# Patient Record
Sex: Female | Born: 1950 | Race: White | Hispanic: No | Marital: Married | Smoking: Current every day smoker
Health system: Southern US, Community
[De-identification: ages and names within clinical notes are randomized; demographics above are authoritative.]

## PROBLEM LIST (undated history)

## (undated) DIAGNOSIS — I10 Essential (primary) hypertension: Secondary | ICD-10-CM

## (undated) DIAGNOSIS — IMO0002 Reserved for concepts with insufficient information to code with codable children: Secondary | ICD-10-CM

## (undated) DIAGNOSIS — M8580 Other specified disorders of bone density and structure, unspecified site: Secondary | ICD-10-CM

## (undated) DIAGNOSIS — M1990 Unspecified osteoarthritis, unspecified site: Secondary | ICD-10-CM

## (undated) DIAGNOSIS — Z87440 Personal history of urinary (tract) infections: Secondary | ICD-10-CM

## (undated) DIAGNOSIS — R200 Anesthesia of skin: Secondary | ICD-10-CM

## (undated) DIAGNOSIS — E039 Hypothyroidism, unspecified: Secondary | ICD-10-CM

## (undated) DIAGNOSIS — K635 Polyp of colon: Secondary | ICD-10-CM

## (undated) DIAGNOSIS — E1165 Type 2 diabetes mellitus with hyperglycemia: Secondary | ICD-10-CM

## (undated) DIAGNOSIS — D649 Anemia, unspecified: Secondary | ICD-10-CM

## (undated) DIAGNOSIS — M5416 Radiculopathy, lumbar region: Secondary | ICD-10-CM

## (undated) HISTORY — PX: ESOPHAGOGASTRODUODENOSCOPY ENDOSCOPY: SHX5814

## (undated) HISTORY — DX: Anemia, unspecified: D64.9

## (undated) HISTORY — DX: Type 2 diabetes mellitus with hyperglycemia: E11.65

## (undated) HISTORY — DX: Radiculopathy, lumbar region: M54.16

## (undated) HISTORY — PX: COLONOSCOPY: SHX174

## (undated) HISTORY — DX: Hypothyroidism, unspecified: E03.9

## (undated) HISTORY — PX: THYROIDECTOMY: SHX17

## (undated) HISTORY — DX: Reserved for concepts with insufficient information to code with codable children: IMO0002

## (undated) HISTORY — DX: Essential (primary) hypertension: I10

## (undated) HISTORY — DX: Polyp of colon: K63.5

## (undated) HISTORY — DX: Other specified disorders of bone density and structure, unspecified site: M85.80

## (undated) HISTORY — PX: ABDOMINAL HYSTERECTOMY: SHX81

---

## 1999-12-14 HISTORY — PX: FOOT AMPUTATION: SHX951

## 2012-04-29 ENCOUNTER — Emergency Department: Payer: Self-pay | Admitting: *Deleted

## 2016-01-13 DIAGNOSIS — I152 Hypertension secondary to endocrine disorders: Secondary | ICD-10-CM | POA: Insufficient documentation

## 2016-01-13 DIAGNOSIS — D492 Neoplasm of unspecified behavior of bone, soft tissue, and skin: Secondary | ICD-10-CM | POA: Insufficient documentation

## 2016-01-13 DIAGNOSIS — E039 Hypothyroidism, unspecified: Secondary | ICD-10-CM | POA: Insufficient documentation

## 2016-01-13 DIAGNOSIS — S98011A Complete traumatic amputation of right foot at ankle level, initial encounter: Secondary | ICD-10-CM | POA: Insufficient documentation

## 2016-01-13 DIAGNOSIS — E119 Type 2 diabetes mellitus without complications: Secondary | ICD-10-CM | POA: Insufficient documentation

## 2016-01-13 DIAGNOSIS — L659 Nonscarring hair loss, unspecified: Secondary | ICD-10-CM | POA: Insufficient documentation

## 2016-01-13 DIAGNOSIS — F5101 Primary insomnia: Secondary | ICD-10-CM | POA: Insufficient documentation

## 2016-01-13 DIAGNOSIS — E1159 Type 2 diabetes mellitus with other circulatory complications: Secondary | ICD-10-CM | POA: Insufficient documentation

## 2016-01-13 DIAGNOSIS — I10 Essential (primary) hypertension: Secondary | ICD-10-CM | POA: Insufficient documentation

## 2016-01-13 DIAGNOSIS — M159 Polyosteoarthritis, unspecified: Secondary | ICD-10-CM | POA: Insufficient documentation

## 2016-06-03 ENCOUNTER — Ambulatory Visit (INDEPENDENT_AMBULATORY_CARE_PROVIDER_SITE_OTHER): Payer: Medicare HMO | Admitting: Family Medicine

## 2016-06-03 ENCOUNTER — Other Ambulatory Visit: Payer: Self-pay | Admitting: Family Medicine

## 2016-06-03 ENCOUNTER — Encounter: Payer: Self-pay | Admitting: Family Medicine

## 2016-06-03 VITALS — BP 130/68 | HR 80 | Temp 98.2°F | Resp 16 | Ht 64.0 in | Wt 215.0 lb

## 2016-06-03 DIAGNOSIS — Z1231 Encounter for screening mammogram for malignant neoplasm of breast: Secondary | ICD-10-CM | POA: Diagnosis not present

## 2016-06-03 DIAGNOSIS — D649 Anemia, unspecified: Secondary | ICD-10-CM | POA: Diagnosis not present

## 2016-06-03 DIAGNOSIS — E538 Deficiency of other specified B group vitamins: Secondary | ICD-10-CM | POA: Diagnosis not present

## 2016-06-03 DIAGNOSIS — Z7689 Persons encountering health services in other specified circumstances: Secondary | ICD-10-CM

## 2016-06-03 DIAGNOSIS — Z7189 Other specified counseling: Secondary | ICD-10-CM | POA: Diagnosis not present

## 2016-06-03 DIAGNOSIS — E2839 Other primary ovarian failure: Secondary | ICD-10-CM | POA: Diagnosis not present

## 2016-06-03 DIAGNOSIS — E119 Type 2 diabetes mellitus without complications: Secondary | ICD-10-CM

## 2016-06-03 DIAGNOSIS — Z1239 Encounter for other screening for malignant neoplasm of breast: Secondary | ICD-10-CM

## 2016-06-03 DIAGNOSIS — E038 Other specified hypothyroidism: Secondary | ICD-10-CM | POA: Diagnosis not present
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
## 2016-06-04 ENCOUNTER — Other Ambulatory Visit: Payer: Medicare HMO

## 2016-06-04 ENCOUNTER — Telehealth: Payer: Self-pay | Admitting: Family Medicine

## 2016-06-04 DIAGNOSIS — E119 Type 2 diabetes mellitus without complications: Secondary | ICD-10-CM | POA: Diagnosis not present

## 2016-06-04 DIAGNOSIS — E038 Other specified hypothyroidism: Secondary | ICD-10-CM | POA: Diagnosis not present

## 2016-06-04 LAB — COMPLETE METABOLIC PANEL WITH GFR
ALT: 12 U/L (ref 6–29)
AST: 20 U/L (ref 10–35)
Albumin: 3.5 g/dL — ABNORMAL LOW (ref 3.6–5.1)
Alkaline Phosphatase: 77 U/L (ref 33–130)
BILIRUBIN TOTAL: 0.2 mg/dL (ref 0.2–1.2)
BUN: 10 mg/dL (ref 7–25)
CHLORIDE: 102 mmol/L (ref 98–110)
CO2: 23 mmol/L (ref 20–31)
Calcium: 8.8 mg/dL (ref 8.6–10.4)
Creat: 0.71 mg/dL (ref 0.50–0.99)
GFR, EST NON AFRICAN AMERICAN: 89 mL/min (ref >=60)
GLUCOSE: 180 mg/dL — AB (ref 70–99)
POTASSIUM: 4 mmol/L (ref 3.5–5.3)
SODIUM: 139 mmol/L (ref 135–146)
TOTAL PROTEIN: 5.9 g/dL — AB (ref 6.1–8.1)

## 2016-06-04 LAB — CBC WITH DIFFERENTIAL/PLATELET
BASOS ABS: 0 {cells}/uL (ref 0–200)
Basophils Relative: 0 %
EOS ABS: 177 {cells}/uL (ref 15–500)
EOS PCT: 3 %
HCT: 32.2 % — ABNORMAL LOW (ref 35.0–45.0)
Hemoglobin: 9.6 g/dL — ABNORMAL LOW (ref 12.0–15.0)
LYMPHS PCT: 29 %
Lymphs Abs: 1711 cells/uL (ref 850–3900)
MCH: 22.1 pg — AB (ref 27.0–33.0)
MCHC: 29.8 g/dL — AB (ref 32.0–36.0)
MCV: 74 fL — AB (ref 80.0–100.0)
MONOS PCT: 8 %
MPV: 10.5 fL (ref 7.5–12.5)
Monocytes Absolute: 472 cells/uL (ref 200–950)
NEUTROS ABS: 3540 {cells}/uL (ref 1500–7800)
NEUTROS PCT: 60 %
PLATELETS: 265 10*3/uL (ref 140–400)
RBC: 4.35 MIL/uL (ref 3.80–5.10)
RDW: 18.7 % — AB (ref 11.0–15.0)
WBC: 5.9 10*3/uL (ref 3.8–10.8)

## 2016-06-04 LAB — TSH: TSH: 6.48 m[IU]/L — AB

## 2016-06-04 MED ORDER — CLORAZEPATE DIPOTASSIUM 7.5 MG PO TABS: 7.5000 mg | ORAL_TABLET | Freq: Every day | ORAL | Status: DC

## 2016-06-04 NOTE — Telephone Encounter (Signed)
Patient is calling to say that her pharmacy told her to call us regarding getting a refill on her clorazepate 7.5 mg, she is not sleeping at night  [phone] (H)

## 2016-06-04 NOTE — Telephone Encounter (Signed)
Pt would like this to go to [facility]

## 2016-06-04 NOTE — Telephone Encounter (Signed)
Ok to refill 

## 2016-06-04 NOTE — Telephone Encounter (Signed)
Ok with refill 

## 2016-06-05 ENCOUNTER — Encounter: Payer: Self-pay | Admitting: Family Medicine

## 2016-06-05 LAB — HEMOGLOBIN A1C
Hgb A1c MFr Bld: 8.3 % — ABNORMAL HIGH (ref <5.7)
MEAN PLASMA GLUCOSE: 192 mg/dL

## 2016-06-08 ENCOUNTER — Other Ambulatory Visit: Payer: Self-pay | Admitting: Family Medicine

## 2016-06-08 DIAGNOSIS — D649 Anemia, unspecified: Secondary | ICD-10-CM

## 2016-06-08 MED ORDER — LISINOPRIL 10 MG PO TABS: 10.0000 mg | ORAL_TABLET | Freq: Every day | ORAL | Status: DC

## 2016-06-08 MED ORDER — METFORMIN HCL 1000 MG PO TABS: 1000.0000 mg | ORAL_TABLET | Freq: Two times a day (BID) | ORAL | Status: DC

## 2016-06-08 MED ORDER — PRAVASTATIN SODIUM 40 MG PO TABS: 40.0000 mg | ORAL_TABLET | Freq: Every day | ORAL | Status: DC

## 2016-06-08 MED ORDER — GLIPIZIDE ER 10 MG PO TB24: 10.0000 mg | ORAL_TABLET | Freq: Every day | ORAL | Status: DC

## 2016-06-08 MED ORDER — LEVOTHYROXINE SODIUM 112 MCG PO TABS: 112.0000 ug | ORAL_TABLET | Freq: Every day | ORAL | Status: DC

## 2016-06-08 NOTE — Telephone Encounter (Signed)
Faxed to [facility] on 06/07/16

## 2016-06-09 LAB — IRON: Iron: 120 ug/dL (ref 45–160)

## 2016-06-09 LAB — VITAMIN B12: VITAMIN B 12: 755 pg/mL (ref 200–1100)

## 2016-06-23 ENCOUNTER — Other Ambulatory Visit: Payer: Self-pay | Admitting: Family Medicine

## 2016-06-23 MED ORDER — LISINOPRIL 10 MG PO TABS: 10.0000 mg | ORAL_TABLET | Freq: Every day | ORAL | Status: DC

## 2016-06-23 MED ORDER — LEVOTHYROXINE SODIUM 112 MCG PO TABS: 112.0000 ug | ORAL_TABLET | Freq: Every day | ORAL | Status: DC

## 2016-06-23 NOTE — Telephone Encounter (Signed)
Medication called/sent to requested pharmacy  

## 2016-07-06 ENCOUNTER — Ambulatory Visit
Admission: RE | Admit: 2016-07-06 | Discharge: 2016-07-06 | Disposition: A | Discharge status: Home / Self Care | Payer: Commercial Managed Care - HMO | Source: Ambulatory Visit | Attending: Family Medicine | Admitting: Family Medicine

## 2016-07-06 DIAGNOSIS — M85852 Other specified disorders of bone density and structure, left thigh: Secondary | ICD-10-CM | POA: Diagnosis not present

## 2016-07-06 DIAGNOSIS — Z780 Asymptomatic menopausal state: Secondary | ICD-10-CM | POA: Diagnosis not present

## 2016-07-06 DIAGNOSIS — Z1231 Encounter for screening mammogram for malignant neoplasm of breast: Secondary | ICD-10-CM | POA: Diagnosis not present

## 2016-07-06 DIAGNOSIS — Z1239 Encounter for other screening for malignant neoplasm of breast: Secondary | ICD-10-CM

## 2016-07-06 DIAGNOSIS — E2839 Other primary ovarian failure: Secondary | ICD-10-CM

## 2016-07-08 ENCOUNTER — Encounter: Payer: Self-pay | Admitting: Family Medicine

## 2016-07-09 ENCOUNTER — Encounter: Payer: Self-pay | Admitting: Family Medicine

## 2016-07-28 ENCOUNTER — Other Ambulatory Visit: Payer: Self-pay | Admitting: Family Medicine

## 2016-07-28 DIAGNOSIS — R928 Other abnormal and inconclusive findings on diagnostic imaging of breast: Secondary | ICD-10-CM

## 2016-07-30 ENCOUNTER — Encounter: Payer: Self-pay | Admitting: *Deleted

## 2016-08-04 ENCOUNTER — Other Ambulatory Visit: Payer: Self-pay | Admitting: Family Medicine

## 2016-08-04 ENCOUNTER — Ambulatory Visit
Admission: RE | Admit: 2016-08-04 | Discharge: 2016-08-04 | Disposition: A | Discharge status: Home / Self Care | Payer: Commercial Managed Care - HMO | Source: Ambulatory Visit | Attending: Family Medicine | Admitting: Family Medicine

## 2016-08-04 DIAGNOSIS — R928 Other abnormal and inconclusive findings on diagnostic imaging of breast: Secondary | ICD-10-CM

## 2016-08-04 DIAGNOSIS — N6489 Other specified disorders of breast: Secondary | ICD-10-CM | POA: Diagnosis not present

## 2016-08-04 DIAGNOSIS — N6320 Unspecified lump in the left breast, unspecified quadrant: Secondary | ICD-10-CM

## 2016-08-04 DIAGNOSIS — R922 Inconclusive mammogram: Secondary | ICD-10-CM | POA: Diagnosis not present

## 2016-08-06 ENCOUNTER — Other Ambulatory Visit: Payer: Self-pay | Admitting: Family Medicine

## 2016-08-06 DIAGNOSIS — N6320 Unspecified lump in the left breast, unspecified quadrant: Secondary | ICD-10-CM

## 2016-08-09 ENCOUNTER — Ambulatory Visit
Admission: RE | Admit: 2016-08-09 | Discharge: 2016-08-09 | Disposition: A | Discharge status: Home / Self Care | Payer: Commercial Managed Care - HMO | Source: Ambulatory Visit | Attending: Family Medicine | Admitting: Family Medicine

## 2016-08-09 DIAGNOSIS — N6320 Unspecified lump in the left breast, unspecified quadrant: Secondary | ICD-10-CM

## 2016-08-09 DIAGNOSIS — D242 Benign neoplasm of left breast: Secondary | ICD-10-CM | POA: Diagnosis not present

## 2016-08-09 DIAGNOSIS — N63 Unspecified lump in breast: Secondary | ICD-10-CM | POA: Diagnosis not present

## 2016-09-07 ENCOUNTER — Encounter: Payer: Self-pay | Admitting: Family Medicine

## 2016-09-07 ENCOUNTER — Ambulatory Visit (INDEPENDENT_AMBULATORY_CARE_PROVIDER_SITE_OTHER): Payer: Self-pay | Admitting: Family Medicine

## 2016-09-07 VITALS — BP 140/76 | HR 80 | Temp 98.0°F | Resp 18 | Ht 64.0 in | Wt 216.0 lb

## 2016-09-07 DIAGNOSIS — L821 Other seborrheic keratosis: Secondary | ICD-10-CM | POA: Diagnosis not present

## 2016-09-07 DIAGNOSIS — M25572 Pain in left ankle and joints of left foot: Secondary | ICD-10-CM
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
## 2016-10-06 DIAGNOSIS — T8484XA Pain due to internal orthopedic prosthetic devices, implants and grafts, initial encounter: Secondary | ICD-10-CM | POA: Diagnosis not present

## 2016-10-07 ENCOUNTER — Ambulatory Visit (INDEPENDENT_AMBULATORY_CARE_PROVIDER_SITE_OTHER): Payer: Commercial Managed Care - HMO | Admitting: *Deleted

## 2016-10-07 DIAGNOSIS — Z23 Encounter for immunization: Secondary | ICD-10-CM

## 2016-10-07 NOTE — Progress Notes (Signed)
Patient ID: Pamela Cherry, female   DOB: May 28, 1951, 65 y.o.   MRN: [id number]  Patient seen in office for Influenza Vaccination.   Tolerated IM administration well.   Immunization history updated.

## 2016-12-09 ENCOUNTER — Ambulatory Visit: Payer: Commercial Managed Care - HMO | Admitting: Family Medicine

## 2016-12-30 ENCOUNTER — Ambulatory Visit: Payer: Commercial Managed Care - HMO | Admitting: Family Medicine

## 2017-01-05 DIAGNOSIS — S88111D Complete traumatic amputation at level between knee and ankle, right lower leg, subsequent encounter: Secondary | ICD-10-CM | POA: Diagnosis not present

## 2017-01-06 ENCOUNTER — Encounter: Payer: Self-pay | Admitting: Family Medicine

## 2017-01-06 ENCOUNTER — Ambulatory Visit (INDEPENDENT_AMBULATORY_CARE_PROVIDER_SITE_OTHER): Payer: Medicare HMO | Admitting: Family Medicine

## 2017-01-06 VITALS — BP 144/72 | HR 68 | Temp 98.3°F | Resp 16 | Ht 64.0 in | Wt 216.0 lb

## 2017-01-06 DIAGNOSIS — E038 Other specified hypothyroidism: Secondary | ICD-10-CM | POA: Diagnosis not present

## 2017-01-06 DIAGNOSIS — E162 Hypoglycemia, unspecified: Secondary | ICD-10-CM | POA: Diagnosis not present

## 2017-01-06 LAB — COMPLETE METABOLIC PANEL WITH GFR
ALBUMIN: 3.7 g/dL (ref 3.6–5.1)
ALK PHOS: 82 U/L (ref 33–130)
ALT: 12 U/L (ref 6–29)
AST: 22 U/L (ref 10–35)
BILIRUBIN TOTAL: 0.3 mg/dL (ref 0.2–1.2)
BUN: 10 mg/dL (ref 7–25)
CO2: 23 mmol/L (ref 20–31)
CREATININE: 0.56 mg/dL (ref 0.50–0.99)
Calcium: 9.1 mg/dL (ref 8.6–10.4)
Chloride: 103 mmol/L (ref 98–110)
GFR, Est African American: >89 mL/min (ref >=60)
GLUCOSE: 233 mg/dL — AB (ref 70–99)
Potassium: 4 mmol/L (ref 3.5–5.3)
SODIUM: 139 mmol/L (ref 135–146)
TOTAL PROTEIN: 6.3 g/dL (ref 6.1–8.1)

## 2017-01-06 LAB — TSH: TSH: 4.93 mIU/L — ABNORMAL HIGH

## 2017-01-06 NOTE — Progress Notes (Signed)
Subjective:    Patient ID: Pamela Cherry, female    DOB: Jun 20, 1951, 66 y.o.   MRN: [id number]  HPI Patient is here today reporting episodic sweating. It will occur suddenly and without warning. She will break out in a sweat mainly on her face and in her head. At the exact same time she will feel very weak and jittery. She states it's been going on for several months. She denies any fever. Of note when I saw her in June, her hemoglobin A1c was 8.3. At that time I discontinued Janumet and start the patient on metformin and glipizide (please see the results section with labs in June). Patient also was found to have an insufficient dose of levothyroxine and therefore I increased this in June. She was supposed to come back in 3 months for repeat lab work that the patient never followed up. Her symptoms sound like hypoglycemic episodes Past Medical History:  Diagnosis Date  . Anemia   . Hypothyroidism   . Osteopenia   . Type II diabetes mellitus, uncontrolled ([place])    No past surgical history on file. Current Outpatient Prescriptions on File Prior to Visit  Medication Sig Dispense Refill  . aspirin (GOODSENSE ASPIRIN) 81 MG chewable tablet Chew 81 mg by mouth daily.    . calcium carbonate (CALCIUM 600) 600 MG TABS tablet Take by mouth.    . clorazepate (TRANXENE) 7.5 MG tablet Take 1 tablet (7.5 mg total) by mouth at bedtime. 90 tablet 1  . ferrous gluconate ([facility] IRON) 240 (27 FE) MG tablet Take 240 mg by mouth.    . fluticasone (FLONASE) 50 MCG/ACT nasal spray Place into the nose.    . gabapentin (NEURONTIN) 300 MG capsule Take 300 mg by mouth.    Marland Kitchen glipiZIDE (GLUCOTROL XL) 10 MG 24 hr tablet Take 1 tablet (10 mg total) by mouth daily with breakfast. 90 tablet 1  . levothyroxine (SYNTHROID, LEVOTHROID) 112 MCG tablet Take 1 tablet (112 mcg total) by mouth daily. 90 tablet 3  . lisinopril (PRINIVIL,ZESTRIL) 10 MG tablet Take 1 tablet (10 mg total) by mouth daily. 90 tablet 3  . metFORMIN  (GLUCOPHAGE) 1000 MG tablet Take 1 tablet (1,000 mg total) by mouth 2 (two) times daily with a meal. 180 tablet 3  . Multiple Vitamin (MULTI-VITAMINS) TABS Take by mouth.    . pravastatin (PRAVACHOL) 40 MG tablet Take 1 tablet (40 mg total) by mouth daily. 90 tablet 3   No current facility-administered medications on file prior to visit.    Allergies  Allergen Reactions  . Other Swelling    NICKLE ALLERGY   Social History   Social History  . Marital status: Married    Spouse name: N/A  . Number of children: N/A  . Years of education: N/A   Occupational History  . Not on file.   Social History Main Topics  . Smoking status: Current Every Day Smoker  . Smokeless tobacco: Never Used  . Alcohol use No  . Drug use: No  . Sexual activity: Not on file   Other Topics Concern  . Not on file   Social History Narrative  . No narrative on file      Review of Systems  All other systems reviewed and are negative.      Objective:   Physical Exam  Constitutional: She appears well-developed and well-nourished. No distress.  Cardiovascular: Normal rate, regular rhythm and normal heart sounds.   Pulmonary/Chest: Effort normal and breath sounds  normal. No respiratory distress. She has no wheezes. She has no rales.  Abdominal: Soft. Bowel sounds are normal.  Skin: She is not diaphoretic.  Vitals reviewed.         Assessment & Plan:  Other specified hypothyroidism - Plan: TSH  Hypoglycemia - Plan: COMPLETE METABOLIC PANEL WITH GFR, Hemoglobin A1c  These episodes sound like hypoglycemia due to glipizide.  I recommended the patient check her blood sugar the next time it happens. I will check a TSH today to see how well controlled her thyroid is. I will also check a hemoglobin A1c. If her hemoglobin A1c has dropped dramatically from 8.3, I will discontinue glipizide and replace with a medication not as prone to cause hypoglycemia

## 2017-01-07 ENCOUNTER — Other Ambulatory Visit: Payer: Self-pay | Admitting: Family Medicine

## 2017-01-07 LAB — HEMOGLOBIN A1C
Hgb A1c MFr Bld: 8 % — ABNORMAL HIGH (ref <5.7)
Mean Plasma Glucose: 183 mg/dL

## 2017-01-07 MED ORDER — SITAGLIPTIN PHOSPHATE 100 MG PO TABS: 100.0000 mg | ORAL_TABLET | Freq: Every day | ORAL | 1 refills | Status: DC

## 2017-01-07 MED ORDER — EMPAGLIFLOZIN 25 MG PO TABS: 25.0000 mg | ORAL_TABLET | Freq: Every day | ORAL | 1 refills | Status: DC

## 2017-01-12 ENCOUNTER — Encounter: Payer: Self-pay | Admitting: Family Medicine

## 2017-01-12 ENCOUNTER — Ambulatory Visit (INDEPENDENT_AMBULATORY_CARE_PROVIDER_SITE_OTHER): Payer: Medicare HMO | Admitting: Family Medicine

## 2017-01-12 VITALS — BP 110/58 | HR 88 | Temp 97.8°F | Resp 20 | Ht 64.0 in | Wt 210.0 lb

## 2017-01-12 DIAGNOSIS — J449 Chronic obstructive pulmonary disease, unspecified: Secondary | ICD-10-CM | POA: Diagnosis not present

## 2017-01-12 MED ORDER — DOXYCYCLINE HYCLATE 100 MG PO TABS: 100.0000 mg | ORAL_TABLET | Freq: Two times a day (BID) | ORAL | 0 refills | Status: DC

## 2017-01-12 MED ORDER — DIPHENOXYLATE-ATROPINE 2.5-0.025 MG PO TABS: 2.0000 | ORAL_TABLET | Freq: Four times a day (QID) | ORAL | 0 refills | Status: DC | PRN

## 2017-01-12 MED ORDER — PREDNISONE 20 MG PO TABS: ORAL_TABLET | ORAL | 0 refills | Status: DC

## 2017-01-12 MED ORDER — PIOGLITAZONE HCL 30 MG PO TABS
30.0000 mg | ORAL_TABLET | Freq: Every day | ORAL | 3 refills | Status: DC
Start: 2017-01-12 — End: 2019-01-22

## 2017-01-12 NOTE — Progress Notes (Signed)
Subjective:    Patient ID: Pamela Cherry, female    DOB: 19-Jun-1951, 66 y.o.   MRN: [id number]  HPI  01/06/17 Patient is here today reporting episodic sweating. It will occur suddenly and without warning. She will break out in a sweat mainly on her face and in her head. At the exact same time she will feel very weak and jittery. She states it's been going on for several months. She denies any fever. Of note when I saw her in June, her hemoglobin A1c was 8.3. At that time I discontinued Janumet and start the patient on metformin and glipizide (please see the results section with labs in June). Patient also was found to have an insufficient dose of levothyroxine and therefore I increased this in June. She was supposed to come back in 3 months for repeat lab work that the patient never followed up. Her symptoms sound like hypoglycemic episodes.  At that time, my plan was: These episodes sound like hypoglycemia due to glipizide.  I recommended the patient check her blood sugar the next time it happens. I will check a TSH today to see how well controlled her thyroid is. I will also check a hemoglobin A1c. If her hemoglobin A1c has dropped dramatically from 8.3, I will discontinue glipizide and replace with a medication not as prone to cause hypoglycemia  01/12/17 Patient's hemoglobin A1c was found to be greater than 8. We asked her to discontinue glipizide and replaced with a combination of Jardiance and Januvia.  Since discontinuing glipizide, the sweating spells of hypoglycemia has stopped. Unfortunately she cannot afford the other 2 medications. She also has developed fevers and body aches and a cough productive of brown sputum. She is also having frequent diarrhea. She's lost 6 pounds since last week and is lightheaded. She appears slightly dehydrated. Past Medical History:  Diagnosis Date  . Anemia   . Hypothyroidism   . Osteopenia   . Type II diabetes mellitus, uncontrolled ([place])    No past  surgical history on file. Current Outpatient Prescriptions on File Prior to Visit  Medication Sig Dispense Refill  . aspirin (GOODSENSE ASPIRIN) 81 MG chewable tablet Chew 81 mg by mouth daily.    . calcium carbonate (CALCIUM 600) 600 MG TABS tablet Take by mouth.    . clorazepate (TRANXENE) 7.5 MG tablet Take 1 tablet (7.5 mg total) by mouth at bedtime. 90 tablet 1  . empagliflozin (JARDIANCE) 25 MG TABS tablet Take 25 mg by mouth daily. 90 tablet 1  . ferrous gluconate ([facility] IRON) 240 (27 FE) MG tablet Take 240 mg by mouth.    . fluticasone (FLONASE) 50 MCG/ACT nasal spray Place into the nose.    . gabapentin (NEURONTIN) 300 MG capsule Take 300 mg by mouth.    Marland Kitchen glipiZIDE (GLUCOTROL XL) 10 MG 24 hr tablet Take 1 tablet (10 mg total) by mouth daily with breakfast. 90 tablet 1  . levothyroxine (SYNTHROID, LEVOTHROID) 112 MCG tablet Take 1 tablet (112 mcg total) by mouth daily. 90 tablet 3  . lisinopril (PRINIVIL,ZESTRIL) 10 MG tablet Take 1 tablet (10 mg total) by mouth daily. 90 tablet 3  . metFORMIN (GLUCOPHAGE) 1000 MG tablet Take 1 tablet (1,000 mg total) by mouth 2 (two) times daily with a meal. 180 tablet 3  . Multiple Vitamin (MULTI-VITAMINS) TABS Take by mouth.    . pravastatin (PRAVACHOL) 40 MG tablet Take 1 tablet (40 mg total) by mouth daily. 90 tablet 3  . sitaGLIPtin (JANUVIA)  100 MG tablet Take 1 tablet (100 mg total) by mouth daily. 90 tablet 1   No current facility-administered medications on file prior to visit.    Allergies  Allergen Reactions  . Other Swelling    NICKLE ALLERGY   Social History   Social History  . Marital status: Married    Spouse name: N/A  . Number of children: N/A  . Years of education: N/A   Occupational History  . Not on file.   Social History Main Topics  . Smoking status: Current Every Day Smoker  . Smokeless tobacco: Never Used  . Alcohol use No  . Drug use: No  . Sexual activity: Not on file   Other Topics Concern  . Not on file     Social History Narrative  . No narrative on file      Review of Systems  Gastrointestinal: Positive for diarrhea.  All other systems reviewed and are negative.      Objective:   Physical Exam  Constitutional: She appears well-developed and well-nourished. No distress.  Cardiovascular: Normal rate, regular rhythm and normal heart sounds.   Pulmonary/Chest: Effort normal. No respiratory distress. She has wheezes. She has rales.  Abdominal: Soft. Bowel sounds are normal.  Skin: She is not diaphoretic.  Vitals reviewed.         Assessment & Plan:  Bronchitis with airway obstruction ([facility])  Temporarily hold lisinopril and metformin until illnesses better because of dehydration. Treat bronchitis with bronchospasm using a combination of prednisone taper pack and doxycycline 100 mg by mouth twice a day for 10 days. Moving forward, to treat her diabetes, the patient decides to stay on glipizide and put up with the sweating spells. It still needs help and therefore we will add Actos 30 mg a day and recheck hemoglobin A1c in 3 months.

## 2017-04-08 ENCOUNTER — Encounter: Payer: Self-pay | Admitting: Family Medicine

## 2017-05-06 ENCOUNTER — Encounter: Payer: Self-pay | Admitting: Family Medicine

## 2017-06-03 ENCOUNTER — Other Ambulatory Visit: Payer: Self-pay | Admitting: Family Medicine

## 2017-06-03 ENCOUNTER — Ambulatory Visit (INDEPENDENT_AMBULATORY_CARE_PROVIDER_SITE_OTHER): Payer: Medicare HMO | Admitting: Family Medicine

## 2017-06-03 ENCOUNTER — Encounter: Payer: Self-pay | Admitting: Family Medicine

## 2017-06-03 VITALS — BP 130/74 | HR 86 | Temp 98.0°F | Resp 18 | Ht 64.0 in | Wt 230.0 lb

## 2017-06-03 DIAGNOSIS — E119 Type 2 diabetes mellitus without complications: Secondary | ICD-10-CM | POA: Diagnosis not present

## 2017-06-03 DIAGNOSIS — Z23 Encounter for immunization: Secondary | ICD-10-CM | POA: Diagnosis not present

## 2017-06-03 DIAGNOSIS — R29898 Other symptoms and signs involving the musculoskeletal system: Secondary | ICD-10-CM

## 2017-06-03 MED ORDER — DICLOFENAC SODIUM 75 MG PO TBEC
75.0000 mg | DELAYED_RELEASE_TABLET | Freq: Two times a day (BID) | ORAL | 0 refills | Status: DC
Start: 2017-06-03 — End: 2017-06-03

## 2017-06-03 MED ORDER — DICLOFENAC SODIUM 75 MG PO TBEC: 75.0000 mg | DELAYED_RELEASE_TABLET | Freq: Two times a day (BID) | ORAL | 0 refills | Status: DC

## 2017-06-03 NOTE — Progress Notes (Signed)
Subjective:    Patient ID: Pamela Cherry, female    DOB: 11-23-1951, 66 y.o.   MRN: [id number]  HPI Patient has type 2 diabetes. Her most recent hemoglobin A1c in January was greater than 8. She is currently on metformin, glipizide, and Actos. She complains of swelling in her left leg due to the Actos. She denies any shortness of breath. She denies any chest pain. She states that her fasting blood sugars are typically 100 150. She does occasionally have random sugars approaching 200. She denies any hypoglycemic episodes. Her biggest concern is prosodic weakness in her lower legs. She states that occasionally her legs will become "rubbery". She has constant numbness in her left leg. She will develop occasional numbness in her right leg. The muscles were begin to "give out" and she feels that she is going to fall. She reports paresthesias in her left leg. She is also getting paresthesias in her right thigh. She denies any low back pain.  Past Medical History:  Diagnosis Date  . Anemia   . Hypothyroidism   . Osteopenia   . Type II diabetes mellitus, uncontrolled ([place])    Past Surgical History:  Procedure Laterality Date  . right bka     Current Outpatient Prescriptions on File Prior to Visit  Medication Sig Dispense Refill  . aspirin (GOODSENSE ASPIRIN) 81 MG chewable tablet Chew 81 mg by mouth daily.    . calcium carbonate (CALCIUM 600) 600 MG TABS tablet Take by mouth.    . clorazepate (TRANXENE) 7.5 MG tablet Take 1 tablet (7.5 mg total) by mouth at bedtime. 90 tablet 1  . ferrous gluconate ([facility] IRON) 240 (27 FE) MG tablet Take 240 mg by mouth.    . gabapentin (NEURONTIN) 300 MG capsule Take 300 mg by mouth.    Marland Kitchen glipiZIDE (GLUCOTROL XL) 10 MG 24 hr tablet Take 1 tablet (10 mg total) by mouth daily with breakfast. 90 tablet 1  . levothyroxine (SYNTHROID, LEVOTHROID) 112 MCG tablet Take 1 tablet (112 mcg total) by mouth daily. 90 tablet 3  . lisinopril (PRINIVIL,ZESTRIL) 10 MG tablet  Take 1 tablet (10 mg total) by mouth daily. 90 tablet 3  . metFORMIN (GLUCOPHAGE) 1000 MG tablet Take 1 tablet (1,000 mg total) by mouth 2 (two) times daily with a meal. 180 tablet 3  . Multiple Vitamin (MULTI-VITAMINS) TABS Take by mouth.    . pioglitazone (ACTOS) 30 MG tablet Take 1 tablet (30 mg total) by mouth daily. 90 tablet 3  . pravastatin (PRAVACHOL) 40 MG tablet Take 1 tablet (40 mg total) by mouth daily. 90 tablet 3   No current facility-administered medications on file prior to visit.    Allergies  Allergen Reactions  . Other Swelling    NICKLE ALLERGY   Social History   Social History  . Marital status: Married    Spouse name: N/A  . Number of children: N/A  . Years of education: N/A   Occupational History  . Not on file.   Social History Main Topics  . Smoking status: Current Every Day Smoker  . Smokeless tobacco: Never Used  . Alcohol use No  . Drug use: No  . Sexual activity: Not on file   Other Topics Concern  . Not on file   Social History Narrative  . No narrative on file    Review of Systems  All other systems reviewed and are negative.      Objective:   Physical Exam  Constitutional:  She is oriented to person, place, and time. She appears well-developed and well-nourished.  Neck: No JVD present.  Cardiovascular: Normal rate, regular rhythm and normal heart sounds.   Pulmonary/Chest: Effort normal and breath sounds normal. No respiratory distress. She has no wheezes. She has no rales.  Abdominal: Soft. Bowel sounds are normal. She exhibits no distension. There is no tenderness. There is no rebound and no guarding.  Musculoskeletal: She exhibits edema.  Neurological: She is alert and oriented to person, place, and time. She displays abnormal reflex. No cranial nerve deficit. Coordination normal.  Skin: No rash noted. No erythema.  Vitals reviewed. I am unable to elicit patellar reflex at the left leg. She also has diminished strength with ankle  dorsiflexion in the left leg. Otherwise there is no obvious neurologic deficit        Assessment & Plan:  Controlled type 2 diabetes mellitus without complication, without long-term current use of insulin ([place]) - Plan: CBC with Differential/Platelet, COMPLETE METABOLIC PANEL WITH GFR, Lipid panel, Microalbumin, urine, Hemoglobin A1c  Leg weakness, bilateral - Plan: NCV with EMG(electromyography) Her blood pressure today is controlled. Patient received Prevnar 13. Check hemoglobin A1c. Goal hemoglobin A1c is less than 6.5. Also check fasting lipid panel. Goal LDL cholesterol is less than 100. I'm concerned about the weakness in her legs. I suspect this is due to deconditioning. However given the numbness and paresthesias, I will obtain nerve conduction studies to rule out lumbosacral radiculopathy. If there is in fact lumbosacral radiculopathy, she may need an MRI of the spine.  She also complains of occasional pains in her right shoulder for which I will treat her with diclofenac 75 mg by mouth twice a day

## 2017-06-04 LAB — COMPLETE METABOLIC PANEL WITH GFR
ALT: 11 U/L (ref 6–29)
AST: 20 U/L (ref 10–35)
Albumin: 3.6 g/dL (ref 3.6–5.1)
Alkaline Phosphatase: 73 U/L (ref 33–130)
BUN: 11 mg/dL (ref 7–25)
CHLORIDE: 108 mmol/L (ref 98–110)
CO2: 23 mmol/L (ref 20–31)
CREATININE: 0.6 mg/dL (ref 0.50–0.99)
Calcium: 9 mg/dL (ref 8.6–10.4)
GFR, Est African American: >89 mL/min (ref >=60)
GFR, Est Non African American: >89 mL/min (ref >=60)
GLUCOSE: 117 mg/dL — AB (ref 70–99)
Potassium: 4 mmol/L (ref 3.5–5.3)
SODIUM: 139 mmol/L (ref 135–146)
Total Bilirubin: 0.2 mg/dL (ref 0.2–1.2)
Total Protein: 6 g/dL — ABNORMAL LOW (ref 6.1–8.1)

## 2017-06-04 LAB — MICROALBUMIN, URINE: Microalb, Ur: 0.8 mg/dL

## 2017-06-04 LAB — CBC WITH DIFFERENTIAL/PLATELET
BASOS PCT: 0 %
Basophils Absolute: 0 cells/uL (ref 0–200)
Eosinophils Absolute: 177 cells/uL (ref 15–500)
Eosinophils Relative: 3 %
HEMATOCRIT: 30.1 % — AB (ref 35.0–45.0)
Hemoglobin: 8.9 g/dL — CL (ref 12.0–15.0)
LYMPHS PCT: 27 %
Lymphs Abs: 1593 cells/uL (ref 850–3900)
MCH: 22.1 pg — ABNORMAL LOW (ref 27.0–33.0)
MCHC: 29.6 g/dL — ABNORMAL LOW (ref 32.0–36.0)
MCV: 74.7 fL — ABNORMAL LOW (ref 80.0–100.0)
MONO ABS: 354 {cells}/uL (ref 200–950)
MONOS PCT: 6 %
MPV: 9.5 fL (ref 7.5–12.5)
Neutro Abs: 3776 cells/uL (ref 1500–7800)
Neutrophils Relative %: 64 %
Platelets: 251 10*3/uL (ref 140–400)
RBC: 4.03 MIL/uL (ref 3.80–5.10)
RDW: 19.9 % — AB (ref 11.0–15.0)
WBC: 5.9 10*3/uL (ref 3.8–10.8)

## 2017-06-04 LAB — LIPID PANEL
Cholesterol: 131 mg/dL (ref <200)
HDL: 37 mg/dL — ABNORMAL LOW (ref >50)
LDL CALC: 66 mg/dL (ref <100)
Total CHOL/HDL Ratio: 3.5 Ratio (ref <5.0)
Triglycerides: 140 mg/dL (ref <150)
VLDL: 28 mg/dL (ref <30)

## 2017-06-04 LAB — HEMOGLOBIN A1C
HEMOGLOBIN A1C: 6 % — AB (ref <5.7)
MEAN PLASMA GLUCOSE: 126 mg/dL

## 2017-06-06 NOTE — Addendum Note (Signed)
Addended by: Shary Decamp B on: 06/06/2017 02:43 PM   Modules accepted: Orders

## 2017-06-09 ENCOUNTER — Other Ambulatory Visit: Payer: Self-pay | Admitting: Family Medicine

## 2017-06-09 DIAGNOSIS — D649 Anemia, unspecified: Secondary | ICD-10-CM

## 2017-06-09 LAB — VITAMIN B12: VITAMIN B 12: 460 pg/mL (ref 200–1100)

## 2017-06-09 LAB — IRON AND TIBC
%SAT: 6 % — ABNORMAL LOW (ref 11–50)
IRON: 24 ug/dL — AB (ref 45–160)
TIBC: 427 ug/dL (ref 250–450)
UIBC: 403 ug/dL

## 2017-06-09 LAB — FOLATE: Folate: 12.1 ng/mL (ref >5.4)

## 2017-06-09 LAB — FERRITIN: Ferritin: 6 ng/mL — ABNORMAL LOW (ref 20–288)

## 2017-06-10 ENCOUNTER — Other Ambulatory Visit: Payer: Medicare HMO

## 2017-06-10 DIAGNOSIS — D649 Anemia, unspecified: Secondary | ICD-10-CM

## 2017-06-10 LAB — CBC WITH DIFFERENTIAL/PLATELET
BASOS ABS: 0 {cells}/uL (ref 0–200)
Basophils Relative: 0 %
EOS ABS: 204 {cells}/uL (ref 15–500)
Eosinophils Relative: 3 %
HCT: 31.3 % — ABNORMAL LOW (ref 35.0–45.0)
HEMOGLOBIN: 9.1 g/dL — AB (ref 12.0–15.0)
LYMPHS ABS: 1836 {cells}/uL (ref 850–3900)
Lymphocytes Relative: 27 %
MCH: 21.9 pg — AB (ref 27.0–33.0)
MCHC: 29.1 g/dL — AB (ref 32.0–36.0)
MCV: 75.4 fL — AB (ref 80.0–100.0)
MPV: 9.2 fL (ref 7.5–12.5)
Monocytes Absolute: 340 cells/uL (ref 200–950)
Monocytes Relative: 5 %
NEUTROS ABS: 4420 {cells}/uL (ref 1500–7800)
NEUTROS PCT: 65 %
Platelets: 266 10*3/uL (ref 140–400)
RBC: 4.15 MIL/uL (ref 3.80–5.10)
RDW: 19.9 % — ABNORMAL HIGH (ref 11.0–15.0)
WBC: 6.8 10*3/uL (ref 3.8–10.8)

## 2017-06-15 ENCOUNTER — Other Ambulatory Visit: Payer: Self-pay | Admitting: Family Medicine

## 2017-06-15 DIAGNOSIS — D649 Anemia, unspecified: Secondary | ICD-10-CM | POA: Diagnosis not present

## 2017-06-17 ENCOUNTER — Other Ambulatory Visit: Payer: Self-pay | Admitting: Family Medicine

## 2017-06-17 DIAGNOSIS — D649 Anemia, unspecified: Secondary | ICD-10-CM | POA: Diagnosis not present

## 2017-06-21 ENCOUNTER — Other Ambulatory Visit: Payer: Self-pay | Admitting: Family Medicine

## 2017-06-21 DIAGNOSIS — D649 Anemia, unspecified: Secondary | ICD-10-CM | POA: Diagnosis not present

## 2017-06-23 ENCOUNTER — Other Ambulatory Visit: Payer: Self-pay

## 2017-06-23 ENCOUNTER — Other Ambulatory Visit: Payer: Medicare HMO

## 2017-06-23 DIAGNOSIS — D649 Anemia, unspecified: Secondary | ICD-10-CM

## 2017-06-24 LAB — FECAL GLOBIN BY IMMUNOCHEMISTRY
Fecal Globin Immuno: DETECTED — AB
Fecal Globin Immuno: DETECTED — AB
Fecal Globin Immuno: DETECTED — AB

## 2017-06-29 ENCOUNTER — Other Ambulatory Visit: Payer: Self-pay | Admitting: Family Medicine

## 2017-06-30 ENCOUNTER — Ambulatory Visit (INDEPENDENT_AMBULATORY_CARE_PROVIDER_SITE_OTHER): Payer: Medicare HMO | Admitting: Diagnostic Neuroimaging

## 2017-06-30 ENCOUNTER — Encounter (INDEPENDENT_AMBULATORY_CARE_PROVIDER_SITE_OTHER): Payer: Self-pay | Admitting: Diagnostic Neuroimaging

## 2017-06-30 DIAGNOSIS — R29898 Other symptoms and signs involving the musculoskeletal system: Secondary | ICD-10-CM | POA: Diagnosis not present

## 2017-06-30 DIAGNOSIS — Z0289 Encounter for other administrative examinations: Secondary | ICD-10-CM

## 2017-07-01 NOTE — Procedures (Signed)
[facility]  NCS (NERVE CONDUCTION STUDY) WITH EMG (ELECTROMYOGRAPHY) REPORT   STUDY DATE: 06/30/17 PATIENT NAME: Pamela Cherry DOB: 1951/08/25 MRN: [id number]  ORDERING CLINICIAN: Jenna Luo, MD  TECHNOLOGIST: Oneita Jolly ELECTROMYOGRAPHER: Earlean Polka. Penumalli, MD  CLINICAL INFORMATION: 66 year old female with lower extremity weakness. Evaluate for neuropathy versus lumbar radiculopathy.   FINDINGS: NERVE CONDUCTION STUDY:  Left peroneal motor response with recording over EDB could not be obtained. Left peroneal motor response with recording over tibialis anterior has normal distal latency, amplitude, borderline slow conduction velocity.  Left tibial motor response has prolonged distal latency, decreased appetite, slow conduction velocity and prolonged F-wave latency.   Left sural and superficial peroneal sensory responses have normal peak latencies and decreased amplitudes.   NEEDLE ELECTROMYOGRAPHY:  Needle examination of left lower extremity shows positive sharp waves and fibrillation potentials in left tibialis anterior and gastrocnemius at rest. There are positive sharp waves in the left lumbar paraspinals. There is decreased motor unit recruitment in left tibialis anterior and left gastrocnemius muscles on exertion.   IMPRESSION:   Abnormal study demonstrating: 1. Electrodiagnostic evidence for a left lumbar radiculopathy (L5, S1). 2. 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    INTERPRETING PHYSICIAN:  Penni Bombard, MD Certified in Neurology, Neurophysiology and Neuroimaging  [facility] [address] [place], [place] [address] [phone]  [facility]    Nerve / Sites Rec. Site Latency Ref. Amplitude Ref. Rel Amp Segments Distance Velocity Ref. Area    ms ms mV mV %  cm m/s m/s mVms  L Peroneal - EDB     Ankle EDB NR ?6.5 NR ?2.0 NR Ankle - EDB 9   NR         Pop fossa - Ankle      L  Tibial - AH     Ankle AH 6.5 ?5.8 0.7 ?4.0 100 Ankle - AH 9   1.1     Pop fossa AH 17.3  0.3  47.3 Pop fossa - Ankle 34 31 ?41 0.7  L Peroneal - Tib Ant     Fib Head Tib Ant 3.9 ?6.7 4.1 ?3.0 100 Fib Head - Tib Ant 10   21.4     Pop fossa Tib Ant 6.2  3.3  80.1 Pop fossa - Fib Head 9 39 ?44 14.6           SNC    Nerve / Sites Rec. Site Peak Lat Ref.  Amp Ref. Segments Distance    ms ms V V  cm  L Sural - Ankle (Calf)     Calf Ankle 3.18 ?4.40 2 ?6 Calf - Ankle 14  L Superficial peroneal - Ankle     Lat leg Ankle 3.39 ?4.40 3 ?6 Lat leg - Ankle 14         F  Wave    Nerve F Lat Ref.   ms ms  L Tibial - AH 60.9 ?56.0       EMG full       EMG Summary Table    Spontaneous MUAP Recruitment  Muscle IA Fib PSW Fasc Other Amp Dur. Poly Pattern  L. Lumbar paraspinals Normal None 1+ None _______ Normal Normal Normal Normal  L. Vastus medialis Normal None None None _______ Normal Normal Normal Normal  L. Tibialis anterior Normal 2+ 2+ None _______ Increased Normal 1+ Reduced  L. Gastrocnemius (Medial head) Normal 1+ 1+ None _______ Increased Normal Normal Reduced

## 2017-07-04 ENCOUNTER — Encounter: Payer: Self-pay | Admitting: Family Medicine

## 2017-07-04 DIAGNOSIS — M5416 Radiculopathy, lumbar region: Secondary | ICD-10-CM | POA: Insufficient documentation

## 2017-07-07 ENCOUNTER — Encounter: Payer: Self-pay | Admitting: Physician Assistant

## 2017-07-07 ENCOUNTER — Encounter: Payer: Self-pay | Admitting: Family Medicine

## 2017-07-07 ENCOUNTER — Ambulatory Visit (INDEPENDENT_AMBULATORY_CARE_PROVIDER_SITE_OTHER): Payer: Medicare HMO | Admitting: Family Medicine

## 2017-07-07 VITALS — BP 156/70 | HR 80 | Temp 98.6°F | Resp 18 | Ht 64.0 in | Wt 232.0 lb

## 2017-07-07 DIAGNOSIS — D500 Iron deficiency anemia secondary to blood loss (chronic): Secondary | ICD-10-CM | POA: Diagnosis not present

## 2017-07-07 NOTE — Progress Notes (Signed)
Subjective:    Patient ID: Pamela Cherry, female    DOB: [birth year], 66 y.o.   MRN: [id number]  HPI We see the patient's last office visit. At that time her hemoglobin was found to be 8.9. She had microcytic anemia consistent with iron deficiency. However 3 out of 3 stool tests return positive for blood. Her last colonoscopy was 9 years ago. Her last EGD was 9 years ago. [place] not have a local gastroenterologist. She reports fatigue. She continues complaining of pain in her neck and in her right shoulder as well as in her lower back and weakness in her leg. Coincidentally, nerve conduction studies in the left leg revealed L5-S1 lumbar radiculopathy. MRI is pending. Given her iron deficiency anemia and guaiac positive stools, I recommended the patient discontinue diclofenac for her neck. She was refusing to see a gastroenterologist and therefore I brought her in today to discuss this with me personally Past Medical History:  Diagnosis Date  . Anemia   . Hypothyroidism   . Lumbar radiculopathy    L5-S1  . Osteopenia   . Type II diabetes mellitus, uncontrolled ([place])    Past Surgical History:  Procedure Laterality Date  . right bka     Current Outpatient Prescriptions on File Prior to Visit  Medication Sig Dispense Refill  . aspirin (GOODSENSE ASPIRIN) 81 MG chewable tablet Chew 81 mg by mouth daily.    . calcium carbonate (CALCIUM 600) 600 MG TABS tablet Take by mouth.    . clorazepate (TRANXENE) 7.5 MG tablet Take 1 tablet (7.5 mg total) by mouth at bedtime. 90 tablet 1  . ferrous gluconate ([facility]S IRON) 240 (27 FE) MG tablet Take 240 mg by mouth.    . gabapentin (NEURONTIN) 300 MG capsule Take 300 mg by mouth.    Marland Kitchen glipiZIDE (GLUCOTROL XL) 10 MG 24 hr tablet Take 1 tablet (10 mg total) by mouth daily with breakfast. 90 tablet 1  . levothyroxine (SYNTHROID, LEVOTHROID) 112 MCG tablet Take 1 tablet (112 mcg total) by mouth daily. 90 tablet  3  . lisinopril (PRINIVIL,ZESTRIL) 10 MG tablet Take 1 tablet (10 mg total) by mouth daily. 90 tablet 3  . metFORMIN (GLUCOPHAGE) 1000 MG tablet Take 1 tablet (1,000 mg total) by mouth 2 (two) times daily with a meal. 180 tablet 3  . Multiple Vitamin (MULTI-VITAMINS) TABS Take by mouth.    . pioglitazone (ACTOS) 30 MG tablet Take 1 tablet (30 mg total) by mouth daily. 90 tablet 3  . pravastatin (PRAVACHOL) 40 MG tablet Take 1 tablet (40 mg total) by mouth daily. 90 tablet 3  . diclofenac (VOLTAREN) 75 MG EC tablet TAKE 1 TABLET (75 MG TOTAL) BY MOUTH 2 (TWO) TIMES DAILY. (Patient not taking: Reported on 07/07/2017) 60 tablet 0   No current facility-administered medications on file prior to visit.    Allergies  Allergen Reactions  . Other Swelling    NICKLE ALLERGY   Social History   Social History  . Marital status: Married    Spouse name: N/A  . Number of children: N/A  . Years of education: N/A   Occupational History  . Not on file.   Social History Main Topics  . Smoking status: Current Every Day Smoker  . Smokeless tobacco: Never Used  . Alcohol use No  . Drug use: No  . Sexual activity: Not on file   Other Topics Concern  . Not  on file   Social History Narrative  . No narrative on file       Review of Systems  All other systems reviewed and are negative.      Objective:   Physical Exam  Constitutional: She appears well-developed and well-nourished.  Cardiovascular: Normal rate, regular rhythm and normal heart sounds.   Pulmonary/Chest: Effort normal and breath sounds normal. No respiratory distress. She has no wheezes.  Abdominal: Soft. Bowel sounds are normal. She exhibits no distension. There is no tenderness. There is no rebound.  Vitals reviewed.         Assessment & Plan:  Iron deficiency anemia due to chronic blood loss - Plan: Ambulatory referral to Gastroenterology 'After 10 minute discussion, the patient finally consents to see a  gastroenterologist. I explained to her that we have to determine what the source of bleeding is. This could point towards a serious underlying medical illness and that simply taking iron may not be sufficient to manage her anemia. Patient agrees and will now see a gastroenterologist which I will schedule for her. We will defer the MRI of her lumbar spine as well as x-rays of her neck until after we have pinpointed the cause of her severe anemia

## 2017-08-05 ENCOUNTER — Ambulatory Visit (INDEPENDENT_AMBULATORY_CARE_PROVIDER_SITE_OTHER): Payer: Medicare HMO | Admitting: Physician Assistant

## 2017-08-05 ENCOUNTER — Encounter: Payer: Self-pay | Admitting: Physician Assistant

## 2017-08-05 ENCOUNTER — Other Ambulatory Visit (INDEPENDENT_AMBULATORY_CARE_PROVIDER_SITE_OTHER): Payer: Medicare HMO

## 2017-08-05 VITALS — BP 154/74 | HR 80 | Ht 64.0 in | Wt 232.4 lb

## 2017-08-05 DIAGNOSIS — D509 Iron deficiency anemia, unspecified: Secondary | ICD-10-CM

## 2017-08-05 DIAGNOSIS — Z800 Family history of malignant neoplasm of digestive organs: Secondary | ICD-10-CM

## 2017-08-05 DIAGNOSIS — R195 Other fecal abnormalities: Secondary | ICD-10-CM | POA: Diagnosis not present

## 2017-08-05 LAB — CBC WITH DIFFERENTIAL/PLATELET
BASOS PCT: 0.6 % (ref 0.0–3.0)
Basophils Absolute: 0.1 10*3/uL (ref 0.0–0.1)
Eosinophils Absolute: 0.2 10*3/uL (ref 0.0–0.7)
Eosinophils Relative: 3.1 % (ref 0.0–5.0)
HCT: 32.6 % — ABNORMAL LOW (ref 36.0–46.0)
Hemoglobin: 10 g/dL — ABNORMAL LOW (ref 12.0–15.0)
Lymphocytes Relative: 28.1 % (ref 12.0–46.0)
Lymphs Abs: 2.3 10*3/uL (ref 0.7–4.0)
MCHC: 30.8 g/dL (ref 30.0–36.0)
MCV: 71.8 fl — ABNORMAL LOW (ref 78.0–100.0)
MONO ABS: 0.5 10*3/uL (ref 0.1–1.0)
Monocytes Relative: 5.8 % (ref 3.0–12.0)
NEUTROS ABS: 5 10*3/uL (ref 1.4–7.7)
NEUTROS PCT: 62.4 % (ref 43.0–77.0)
PLATELETS: 273 10*3/uL (ref 150.0–400.0)
RBC: 4.55 Mil/uL (ref 3.87–5.11)
RDW: 18.9 % — AB (ref 11.5–15.5)
WBC: 8 10*3/uL (ref 4.0–10.5)

## 2017-08-05 MED ORDER — SUPREP BOWEL PREP KIT 17.5-3.13-1.6 GM/177ML PO SOLN: 1.0000 | ORAL | 0 refills | Status: DC

## 2017-08-05 MED ORDER — FERROUS SULFATE 325 (65 FE) MG PO TBEC: 325.0000 mg | DELAYED_RELEASE_TABLET | Freq: Three times a day (TID) | ORAL | 4 refills | Status: DC

## 2017-08-05 NOTE — Progress Notes (Signed)
Subjective:    Patient ID: Pamela Cherry, female    DOB: February 16, 1951, 66 y.o.   MRN: [id number]  HPI [name] is a pleasant 66 year old white female, new to GI today referred by Dr. Jenna Luo for evaluation of iron deficiency anemia and Hemoccult-positive stool. Patient has history of hypertension, morbid obesity, adult-onset diabetes mellitus, hyperlipidemia and is status post hysterectomy. She states that she did have prior endoscopy and colonoscopy perhaps 10 years ago in [facility]. She believes her endoscopy was normal and that she did have some polyps removed. She also mentions that she has been told that she has been anemic for many years off and on and had taken over-the-counter iron supplementation on her on throughout the years off and on. Patient has had recent evaluation with labs done 06/10/2017 showing hemoglobin of 9.1 hematocrit of 31.3 MCV of 75. Ferritin of 6 serum iron 24 TIBC of 427 and iron saturation of 6. In June 2017 hemoglobin was 9.6 hematocrit of 32.2. Recent stool fecal occult blood positive. Patient has had fatigue for many months complaints of shortness breath or chest pain. She denies any abdominal pain, appetite has been good no complaints of nausea or vomiting no dysphagia or odynophagia. She does not have any chronic GERD symptoms. She says met metformin sometimes gives her loose stools and she'll alternate between loose stools and constipation ,no melena or hematochezia noted. She has a family history of colon cancer in a brother diagnosed in his 68s who had surgery and is doing well.  Review of Systems Pertinent positive and negative review of systems were noted in the above HPI section.  All other review of systems was otherwise negative.  Outpatient Encounter Prescriptions as of 68/24/2018  Medication Sig  . aspirin (GOODSENSE ASPIRIN) 81 MG chewable tablet Chew 81 mg by mouth daily.  . calcium carbonate (CALCIUM 600) 600 MG TABS tablet Take  by mouth.  . clorazepate (TRANXENE) 7.5 MG tablet Take 1 tablet (7.5 mg total) by mouth at bedtime. (Patient taking differently: Take 7.5 mg by mouth as needed. )  . gabapentin (NEURONTIN) 300 MG capsule Take 300 mg by mouth.  Marland Kitchen glipiZIDE (GLUCOTROL XL) 10 MG 24 hr tablet Take 1 tablet (10 mg total) by mouth daily with breakfast.  . levothyroxine (SYNTHROID, LEVOTHROID) 112 MCG tablet Take 1 tablet (112 mcg total) by mouth daily.  Marland Kitchen lisinopril (PRINIVIL,ZESTRIL) 10 MG tablet Take 1 tablet (10 mg total) by mouth daily.  . metFORMIN (GLUCOPHAGE) 1000 MG tablet Take 1 tablet (1,000 mg total) by mouth 2 (two) times daily with a meal.  . Multiple Vitamin (MULTI-VITAMINS) TABS Take by mouth.  . pioglitazone (ACTOS) 30 MG tablet Take 1 tablet (30 mg total) by mouth daily.  . pravastatin (PRAVACHOL) 40 MG tablet Take 1 tablet (40 mg total) by mouth daily.  . ferrous sulfate 325 (65 FE) MG EC tablet Take 1 tablet (325 mg total) by mouth 3 (three) times daily with meals.  Manus Gunning BOWEL PREP KIT 17.5-3.13-1.6 GM/180ML SOLN Take 1 kit by mouth as directed.  . [DISCONTINUED] diclofenac (VOLTAREN) 75 MG EC tablet TAKE 1 TABLET (75 MG TOTAL) BY MOUTH 2 (TWO) TIMES DAILY. (Patient not taking: Reported on 07/07/2017)  . [DISCONTINUED] ferrous gluconate ([facility]S IRON) 240 (27 FE) MG tablet Take 240 mg by mouth.   No facility-administered encounter medications on file as of 08/05/2017.    Allergies  Allergen Reactions  . Other Swelling    NICKLE ALLERGY  Patient Active Problem List   Diagnosis Date Noted  . Lumbar radiculopathy   . Acquired hypothyroidism 01/13/2016  . Alopecia 01/13/2016  . 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 Essential hypertension 01/13/2016  . Generalized OA 01/13/2016  . Primary insomnia 01/13/2016  . Type 2 diabetes mellitus without complication ([place]) 41/32/4401   Social History   Social History  . Marital status: Married    Spouse name: N/A  . Number  of children: 1  . Years of education: N/A   Occupational History  . retired    Social History Main Topics  . Smoking status: Current Every Day Smoker  . Smokeless tobacco: Never Used  . Alcohol use No  . Drug use: No  . Sexual activity: Not on file   Other Topics Concern  . Not on file   Social History Narrative  . No narrative on file    Ms. Pamela Cherry family history includes Cancer in her father; Colon cancer in her brother and brother; Diabetes in her mother; Hypertension in her mother; Lung cancer in her brother.      Objective:    Vitals:   08/05/17 0951  BP: (!) 154/74  Pulse: 80    Physical Exam well-developed older white female in no acute distress, pleasant blood pressure 154/74 pulse 80, height 5 foot 4, weight 232, BMI of 39.8. HEENT; nontraumatic normocephalic EOMI PERRLA sclera anicteric, Cardiovascular; regular rate and rhythm with S1-S2 no murmur rub or gallop, Pulmonary ;clear bilaterally, Abdomen ;obese soft she has some minimal bilateral lower quadrant tenderness no guarding or rebound no palpable mass or hepatosplenomegaly, low midline incisional scar from prior hysterectomy, Rectal ;exam not done previously documented Hemoccult-positive Extremities; no clubbing cyanosis or edema skin warm and dry, Neuropsych ;mood and affect appropriate       Assessment & Plan:   #[birth year] 66 year old female with new diagnosis of iron deficiency anemia and Hemoccult-positive stool. Patient asymptomatic other than fatigue. Rule out occult colon lesion, AVMs chronic gastropathy or other occult small bowel pathology. #2 Positive family history of colon cancer in patient's brother #3 obesity BMI 39.9 #4 hypertension #5 adult-onset diabetes mellitus #6 status post hysterectomy  Plan; Patient will be scheduled for colonoscopy and upper endoscopy with Dr. Loletha Carrow. . Both procedures discussed in detail with the patient including risks and benefits and she is agreeable to  proceed. Repeat CBC today We'll send prescription for her sulfate 325 mg by mouth 3 times a day 4 months.  [address] Genia Harold PA-C 68/24/2018   Cc: Susy Frizzle, MD

## 2017-08-05 NOTE — Patient Instructions (Signed)
Your physician has requested that you go to the basement for the following lab work before leaving today: [place] have been scheduled for an endoscopy and colonoscopy. Please follow the written instructions given to you at your visit today. Please pick up your prep supplies at the pharmacy within the next 1-3 days. If you use inhalers (even only as needed), please bring them with you on the day of your procedure. Your physician has requested that you go to www.startemmi.com and enter the access code given to you at your visit today. This web site gives a general overview about your procedure. However, you should still follow specific instructions given to you by our office regarding your preparation for the procedure.  We have sent the following medications to your pharmacy for you to pick up at your convenience: Ferrous sulfate 325 mg 1 tablet three times daily with food  If you are age 65 or older, your body mass index should be between 23-30. Your Body mass index is 39.89 kg/m. If this is out of the aforementioned range listed, please consider follow up with your Primary Care Provider.  If you are age 82 or younger, your body mass index should be between 19-25. Your Body mass index is 39.89 kg/m. If this is out of the aformentioned range listed, please consider follow up with your Primary Care Provider.

## 2017-08-08 NOTE — Progress Notes (Signed)
Thank you for sending this case to me. I have reviewed the entire note, and the outlined plan seems appropriate.   Juliyah Mergen Danis, MD  

## 2017-08-09 ENCOUNTER — Telehealth: Payer: Self-pay | Admitting: Family Medicine

## 2017-08-09 NOTE — Telephone Encounter (Signed)
ok 

## 2017-08-09 NOTE — Telephone Encounter (Signed)
Pt called to let us her hemoglobin is up to 10 finally.

## 2017-08-23 ENCOUNTER — Encounter: Payer: Self-pay | Admitting: Gastroenterology

## 2017-08-31 ENCOUNTER — Telehealth: Payer: Self-pay | Admitting: Gastroenterology

## 2017-08-31 NOTE — Telephone Encounter (Signed)
Left a message to return call.  

## 2017-08-31 NOTE — Telephone Encounter (Signed)
Pt will pick up free sample of Suprep.

## 2017-08-31 NOTE — Telephone Encounter (Signed)
[facility] pharmacist calling states she has tried to use coupon for patient and because of her Medicare insurance she cannot use the coupon and the medication is still over 90 dollars for the patient. Pamela Cherry is asking if we have a different coupon we can use for the patient.

## 2017-09-03 ENCOUNTER — Telehealth: Payer: Self-pay | Admitting: Gastroenterology

## 2017-09-03 NOTE — Telephone Encounter (Signed)
Patient called this morning. She reported yesterday she had abdominal pain and had 2 bowel movements with red blood - she is unable to quantify how much blood she saw. She is being evaluated for iron deficiency anemia and has an EGD and colonoscopy scheduled with Dr. Loletha Carrow in 3 days as an outpatient. She reports feeling better this morning, pain better, no further bowel movements since yesterday. She denies dizziness or lightheadedness. No chest pain or shortness of breath.  I explained to the patient, at this time on the weekend, if she continues to have pain or recurrence of blood in the stool to come to the hospital for evaluation. If her symptoms have resolved she should rest up at home and keep her planned appointment with Dr. Loletha Carrow on Tuesday. She verbalized understanding, she wishes to monitor at home for now but will come to the hospital for any recurrent bleeding or pain.

## 2017-09-04 ENCOUNTER — Emergency Department (HOSPITAL_COMMUNITY)
Admission: EM | Admit: 2017-09-04 | Discharge: 2017-09-04 | Discharge status: Against Medical Advice | Payer: Medicare HMO | Attending: Emergency Medicine | Admitting: Emergency Medicine

## 2017-09-04 ENCOUNTER — Encounter (HOSPITAL_COMMUNITY): Payer: Self-pay | Admitting: Emergency Medicine

## 2017-09-04 DIAGNOSIS — K625 Hemorrhage of anus and rectum: Secondary | ICD-10-CM | POA: Diagnosis present

## 2017-09-04 DIAGNOSIS — Z5321 Procedure and treatment not carried out due to patient leaving prior to being seen by health care provider: Secondary | ICD-10-CM | POA: Diagnosis not present

## 2017-09-04 LAB — URINALYSIS, MICROSCOPIC (REFLEX): RBC / HPF: NONE SEEN RBC/hpf (ref 0–5)

## 2017-09-04 LAB — CBC
HEMATOCRIT: 31.9 % — AB (ref 36.0–46.0)
HEMOGLOBIN: 9.5 g/dL — AB (ref 12.0–15.0)
MCH: 21.5 pg — AB (ref 26.0–34.0)
MCHC: 29.8 g/dL — ABNORMAL LOW (ref 30.0–36.0)
MCV: 72.2 fL — AB (ref 78.0–100.0)
Platelets: 279 10*3/uL (ref 150–400)
RBC: 4.42 MIL/uL (ref 3.87–5.11)
RDW: 18 % — ABNORMAL HIGH (ref 11.5–15.5)
WBC: 8.1 10*3/uL (ref 4.0–10.5)

## 2017-09-04 LAB — URINALYSIS, ROUTINE W REFLEX MICROSCOPIC
BILIRUBIN URINE: NEGATIVE
Glucose, UA: NEGATIVE mg/dL
Ketones, ur: NEGATIVE mg/dL
NITRITE: POSITIVE — AB
Protein, ur: NEGATIVE mg/dL
pH: 6.5 (ref 5.0–8.0)

## 2017-09-04 LAB — TYPE AND SCREEN
ABO/RH(D): A POS
Antibody Screen: NEGATIVE

## 2017-09-04 LAB — COMPREHENSIVE METABOLIC PANEL
ALBUMIN: 4 g/dL (ref 3.5–5.0)
ALT: 14 U/L (ref 14–54)
ANION GAP: 10 (ref 5–15)
AST: 26 U/L (ref 15–41)
Alkaline Phosphatase: 86 U/L (ref 38–126)
BILIRUBIN TOTAL: 0.3 mg/dL (ref 0.3–1.2)
BUN: 11 mg/dL (ref 6–20)
CO2: 26 mmol/L (ref 22–32)
Calcium: 9.7 mg/dL (ref 8.9–10.3)
Chloride: 105 mmol/L (ref 101–111)
Creatinine, Ser: 0.65 mg/dL (ref 0.44–1.00)
GFR calc non Af Amer: >60 mL/min (ref >60)
GLUCOSE: 119 mg/dL — AB (ref 65–99)
POTASSIUM: 3.9 mmol/L (ref 3.5–5.1)
Sodium: 141 mmol/L (ref 135–145)
TOTAL PROTEIN: 7.9 g/dL (ref 6.5–8.1)

## 2017-09-04 LAB — ABO/RH: ABO/RH(D): A POS

## 2017-09-04 NOTE — ED Triage Notes (Signed)
Pt reports she has been having abd pain and bloating since Friday. Pt had 2 BMs with blood in them yesterday. Called gastro provider who told her to come in if she had another BM with blood in it. Had another BM with blood in it this am. Was scheduled to have an outpatient colonoscopy on Tuesday due to anemia or unknown cause.

## 2017-09-06 ENCOUNTER — Telehealth: Payer: Self-pay | Admitting: Gastroenterology

## 2017-09-06 ENCOUNTER — Ambulatory Visit (AMBULATORY_SURGERY_CENTER): Payer: Medicare HMO | Admitting: Gastroenterology

## 2017-09-06 ENCOUNTER — Encounter: Payer: Self-pay | Admitting: Gastroenterology

## 2017-09-06 VITALS — BP 130/72 | HR 80 | Temp 98.0°F | Resp 18 | Ht 64.0 in | Wt 232.0 lb

## 2017-09-06 DIAGNOSIS — D123 Benign neoplasm of transverse colon: Secondary | ICD-10-CM | POA: Diagnosis not present

## 2017-09-06 DIAGNOSIS — R195 Other fecal abnormalities: Secondary | ICD-10-CM

## 2017-09-06 DIAGNOSIS — Z800 Family history of malignant neoplasm of digestive organs: Secondary | ICD-10-CM | POA: Diagnosis not present

## 2017-09-06 DIAGNOSIS — D509 Iron deficiency anemia, unspecified: Secondary | ICD-10-CM

## 2017-09-06 DIAGNOSIS — D124 Benign neoplasm of descending colon: Secondary | ICD-10-CM

## 2017-09-06 DIAGNOSIS — K635 Polyp of colon: Secondary | ICD-10-CM

## 2017-09-06 DIAGNOSIS — D126 Benign neoplasm of colon, unspecified: Secondary | ICD-10-CM | POA: Diagnosis not present

## 2017-09-06 MED ORDER — SODIUM CHLORIDE 0.9 % IV SOLN: 500.0000 mL | INTRAVENOUS | Status: DC

## 2017-09-06 NOTE — Telephone Encounter (Signed)
Ms. Canal needs to be scheduled for a colonoscopy with me at the [facility] outpatient endoscopy lab for APC of a cecal AVM.  Dx: heme positive stool, iron deficiency anemia, cecal AVM.  Suprep again (worked well for today's colonoscopy).  No iron tabs 5 days prior to procedure.  It will need to be during one of my [facility] blocks.  I think there is a spot left on my Oct 5th schedule. If not, next available.

## 2017-09-06 NOTE — Op Note (Signed)
[place] Patient Name: Pamela Cherry Procedure Date: 09/06/2017 2:22 PM MRN: [id number] Endoscopist: Mallie Mussel L. Loletha Carrow , MD Age: 66 Referring MD:  Date of Birth: 01-13-1951 Gender: Female Account #: [id number] Procedure:                Upper GI endoscopy Indications:              Iron deficiency anemia secondary to chronic blood                            loss, Heme positive stool Medicines:                Monitored Anesthesia Care Procedure:                Pre-Anesthesia Assessment:                           - Prior to the procedure, a History and Physical                            was performed, and patient medications and                            allergies were reviewed. The patient's tolerance of                            previous anesthesia was also reviewed. The risks                            and benefits of the procedure and the sedation                            options and risks were discussed with the patient.                            All questions were answered, and informed consent                            was obtained. Anticoagulants: The patient has taken                            aspirin. It was decided not to withhold this                            medication prior to the procedure. ASA Grade                            Assessment: III - A patient with severe systemic                            disease. After reviewing the risks and benefits,                            the patient was deemed in satisfactory condition to  undergo the procedure.                           After obtaining informed consent, the endoscope was                            passed under direct vision. Throughout the                            procedure, the patient's blood pressure, pulse, and                            oxygen saturations were monitored continuously. The                            Endoscope was introduced through the mouth, and                         advanced to the second part of duodenum. The upper                            GI endoscopy was accomplished without difficulty.                            The patient tolerated the procedure well. Scope In: Scope Out: Findings:                 The larynx was normal.                           The esophagus was normal.                           The stomach was normal.                           The cardia and gastric fundus were normal on                            retroflexion.                           The examined duodenum was normal. Complications:            No immediate complications. Estimated Blood Loss:     Estimated blood loss: none. Impression:               - Normal larynx.                           - Normal esophagus.                           - Normal stomach.                           - Normal examined duodenum.                           -  No specimens collected. Recommendation:           - Patient has a contact number available for                            emergencies. The signs and symptoms of potential                            delayed complications were discussed with the                            patient. Return to normal activities tomorrow.                            Written discharge instructions were provided to the                            patient.                           - Resume previous diet.                           - Continue present medications.                           - See the other procedure note for documentation of                            additional recommendations. Kemar Pandit L. Loletha Carrow, MD 09/06/2017 3:11:19 PM This report has been signed electronically.

## 2017-09-06 NOTE — Patient Instructions (Signed)
YOU HAD AN ENDOSCOPIC PROCEDURE TODAY AT THE [facility]:   Refer to the procedure report that was given to you for any specific questions about what was found during the examination.  If the procedure report does not answer your questions, please call your gastroenterologist to clarify.  If you requested that your care partner not be given the details of your procedure findings, then the procedure report has been included in a sealed envelope for you to review at your convenience later.  YOU SHOULD EXPECT: Some feelings of bloating in the abdomen. Passage of more gas than usual.  Walking can help get rid of the air that was put into your GI tract during the procedure and reduce the bloating. If you had a lower endoscopy (such as a colonoscopy or flexible sigmoidoscopy) you may notice spotting of blood in your stool or on the toilet paper. If you underwent a bowel prep for your procedure, you may not have a normal bowel movement for a few days.  Please Note:  You might notice some irritation and congestion in your nose or some drainage.  This is from the oxygen used during your procedure.  There is no need for concern and it should clear up in a day or so.  SYMPTOMS TO REPORT IMMEDIATELY:   Following lower endoscopy (colonoscopy or flexible sigmoidoscopy):  Excessive amounts of blood in the stool  Significant tenderness or worsening of abdominal pains  Swelling of the abdomen that is new, acute  Fever of 100F or higher   Following upper endoscopy (EGD)  Vomiting of blood or coffee ground material  New chest pain or pain under the shoulder blades  Painful or persistently difficult swallowing  New shortness of breath  Fever of 100F or higher  Black, tarry-looking stools  For urgent or emergent issues, a gastroenterologist can be reached at any hour by calling [phone].   DIET:  We do recommend a small meal at first, but then you may proceed to your regular diet.  Drink  plenty of fluids but you should avoid alcoholic beverages for 24 hours.  ACTIVITY:  You should plan to take it easy for the rest of today and you should NOT DRIVE or use heavy machinery until tomorrow (because of the sedation medicines used during the test).    FOLLOW UP: Our staff will call the number listed on your records the next business day following your procedure to check on you and address any questions or concerns that you may have regarding the information given to you following your procedure. If we do not reach you, we will leave a message.  However, if you are feeling well and you are not experiencing any problems, there is no need to return our call.  We will assume that you have returned to your regular daily activities without incident.  If any biopsies were taken you will be contacted by phone or by letter within the next 1-3 weeks.  Please call us at [phone] if you have not heard about the biopsies in 3 weeks.    SIGNATURES/CONFIDENTIALITY: You and/or your care partner have signed paperwork which will be entered into your electronic medical record.  These signatures attest to the fact that that the information above on your After Visit Summary has been reviewed and is understood.  Full responsibility of the confidentiality of this discharge information lies with you and/or your care-partner.  Read all of the handouts given to you by your recovery   room nurse.   The 3rd floor staff [facility]) will call you to schedule another colonoscopy to take care of the bleeding area in your colon.  It will probably be at least two months before the scheduling will occur. It will be in the hospital for safety reasons.

## 2017-09-06 NOTE — Progress Notes (Signed)
Called to room to assist during endoscopic procedure.  Patient ID and intended procedure confirmed with present staff. Received instructions for my participation in the procedure from the performing physician.  

## 2017-09-06 NOTE — Op Note (Signed)
[place] Patient Name: Pamela Cherry Procedure Date: 09/06/2017 2:22 PM MRN: [id number] Endoscopist: Mallie Mussel L. Loletha Carrow , MD Age: 66 Referring MD:  Date of Birth: [birth year] Gender: Female Account #: [id number] Procedure:                Colonoscopy Indications:              Heme positive stool, Iron deficiency anemia                            secondary to chronic blood loss Medicines:                Monitored Anesthesia Care Procedure:                Pre-Anesthesia Assessment:                           - Prior to the procedure, a History and Physical                            was performed, and patient medications and                            allergies were reviewed. The patient's tolerance of                            previous anesthesia was also reviewed. The risks                            and benefits of the procedure and the sedation                            options and risks were discussed with the patient.                            All questions were answered, and informed consent                            was obtained. Anticoagulants: The patient has taken                            aspirin. It was decided not to withhold this                            medication prior to the procedure. ASA Grade                            Assessment: III - A patient with severe systemic                            disease. After reviewing the risks and benefits,                            the patient was deemed in satisfactory condition to  undergo the procedure.                           After obtaining informed consent, the colonoscope                            was passed under direct vision. Throughout the                            procedure, the patient's blood pressure, pulse, and                            oxygen saturations were monitored continuously. The                            Colonoscope was introduced through the anus and                             advanced to the the terminal ileum. The colonoscopy                            was performed without difficulty. The patient                            tolerated the procedure well. The quality of the                            bowel preparation was excellent. The terminal                            ileum, ileocecal valve, appendiceal orifice, and                            rectum were photographed. The quality of the bowel                            preparation was evaluated using the BBPS [facility]                            Bowel Preparation Scale) with scores of: Right                            Colon = 3, Transverse Colon = 3 and Left Colon = 3.                            The total BBPS score equals 9. The bowel                            preparation used was SUPREP. Scope In: 2:40:51 PM Scope Out: 3:05:49 PM Total Procedure Duration: 0 hours 24 minutes 58 seconds  Findings:                 The digital rectal exam findings include decreased  sphincter tone.                           The terminal ileum appeared normal.                           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                           Two sessile polyps were found in the descending                            colon and transverse colon. The polyps were 4 mm in                            size. These polyps were removed with a cold snare.                            Resection and retrieval were complete.                           A tattoo was seen in the transverse colon. The                            tattoo site appeared normal.                           Internal hemorrhoids were found. The hemorrhoids                            were Grade I (internal hemorrhoids that do not                            prolapse).                           The exam was otherwise without abnormality on                            direct  and retroflexion views. Complications:            No immediate complications. Estimated Blood Loss:     Estimated blood loss: none. Impression:               - Decreased sphincter tone found on digital rectal                            exam.                           - The examined portion of the ileum was normal.                           -  A single non-bleeding colonic angioectasia.                           - Two 4 mm polyps in the descending colon and in                            the transverse colon, removed with a cold snare.                            Resected and retrieved.                           - A tattoo was seen in the transverse colon. The                            tattoo site appeared normal.                           - Internal hemorrhoids.                           - The examination was otherwise normal on direct                            and retroflexion views. Recommendation:           - Patient has a contact number available for                            emergencies. The signs and symptoms of potential                            delayed complications were discussed with the                            patient. Return to normal activities tomorrow.                            Written discharge instructions were provided to the                            patient.                           - Resume previous diet.                           - Continue present medications.                           - Await pathology results to determine appropriate                            polyp surveillance interval.                           - Repeat colonoscopy  at the next available hospital                            endoscopy department appointment to perform APC                            ablation of cecal AVM. The AVM is suspected to be                            the source of bleeding. Garnet Chatmon L. Loletha Carrow, MD 09/06/2017 3:17:35 PM This report has been signed electronically.

## 2017-09-06 NOTE — Progress Notes (Signed)
Spontaneous respirations throughout. VSS. Resting comfortably. To PACU on room air. Report to  RN. 

## 2017-09-07 ENCOUNTER — Other Ambulatory Visit: Payer: Self-pay

## 2017-09-07 ENCOUNTER — Telehealth: Payer: Self-pay | Admitting: *Deleted

## 2017-09-07 MED ORDER — NA SULFATE-K SULFATE-MG SULF 17.5-3.13-1.6 GM/177ML PO SOLN: 1.0000 | Freq: Once | ORAL | 0 refills | Status: AC

## 2017-09-07 NOTE — Telephone Encounter (Signed)
  Follow up Call-  Call back number 09/06/2017  Post procedure Call Back phone  # [phone]  Permission to leave phone message Yes  Some recent data might be hidden     Patient questions:  Do you have a fever, pain , or abdominal swelling? No. Pain Score  0 *  Have you tolerated food without any problems? Yes.    Have you been able to return to your normal activities? Yes.    Do you have any questions about your discharge instructions: Diet   No. Medications  No. Follow up visit  No.  Do you have questions or concerns about your Care? No.  Actions: * If pain score is 4 or above: No action needed, pain <4. Pt did say her bottom lip is swollen .Pt says she thinks she must have bitten lip during procedure. Pt says lip skin in not broken but swollen. Encouraged pt to call us back if this doesn't continue to improve.

## 2017-09-07 NOTE — Telephone Encounter (Signed)
Patient aware of scheduled colonoscopy at [facility] on 09/16/17. She is coming by office today to pickup prep kit sample and instructions. Verbal and written instructions to stop iron supplements 5 days prior.

## 2017-09-12 ENCOUNTER — Encounter: Payer: Self-pay | Admitting: Gastroenterology

## 2017-09-14 ENCOUNTER — Encounter (HOSPITAL_COMMUNITY): Payer: Self-pay | Admitting: *Deleted

## 2017-09-16 ENCOUNTER — Encounter (HOSPITAL_COMMUNITY)
Admission: RE | Disposition: A | Discharge status: Home / Self Care | Payer: Self-pay | Source: Ambulatory Visit | Attending: Gastroenterology

## 2017-09-16 ENCOUNTER — Ambulatory Visit (HOSPITAL_COMMUNITY): Payer: Medicare HMO | Admitting: Anesthesiology

## 2017-09-16 ENCOUNTER — Encounter (HOSPITAL_COMMUNITY): Payer: Self-pay

## 2017-09-16 ENCOUNTER — Other Ambulatory Visit: Payer: Self-pay

## 2017-09-16 ENCOUNTER — Ambulatory Visit (HOSPITAL_COMMUNITY)
Admission: RE | Admit: 2017-09-16 | Discharge: 2017-09-16 | Disposition: A | Discharge status: Home / Self Care | Payer: Medicare HMO | Source: Ambulatory Visit | Attending: Gastroenterology | Admitting: Gastroenterology

## 2017-09-16 DIAGNOSIS — M19072 Primary osteoarthritis, left ankle and foot: Secondary | ICD-10-CM | POA: Diagnosis not present

## 2017-09-16 DIAGNOSIS — M8580 Other specified disorders of bone density and structure, unspecified site: Secondary | ICD-10-CM | POA: Diagnosis not present

## 2017-09-16 DIAGNOSIS — Z89511 Acquired absence of right leg below knee: Secondary | ICD-10-CM | POA: Diagnosis not present

## 2017-09-16 DIAGNOSIS — Z86010 Personal history of colonic polyps: Secondary | ICD-10-CM | POA: Diagnosis not present

## 2017-09-16 DIAGNOSIS — Q2733 Arteriovenous malformation of digestive system vessel: Secondary | ICD-10-CM | POA: Diagnosis not present

## 2017-09-16 DIAGNOSIS — E890 Postprocedural hypothyroidism: Secondary | ICD-10-CM | POA: Diagnosis not present

## 2017-09-16 DIAGNOSIS — D500 Iron deficiency anemia secondary to blood loss (chronic): Secondary | ICD-10-CM | POA: Diagnosis not present

## 2017-09-16 DIAGNOSIS — R200 Anesthesia of skin: Secondary | ICD-10-CM | POA: Diagnosis not present

## 2017-09-16 DIAGNOSIS — Z87440 Personal history of urinary (tract) infections: Secondary | ICD-10-CM | POA: Diagnosis not present

## 2017-09-16 DIAGNOSIS — K6289 Other specified diseases of anus and rectum: Secondary | ICD-10-CM

## 2017-09-16 DIAGNOSIS — K5520 Angiodysplasia of colon without hemorrhage: Secondary | ICD-10-CM

## 2017-09-16 DIAGNOSIS — M19071 Primary osteoarthritis, right ankle and foot: Secondary | ICD-10-CM | POA: Diagnosis not present

## 2017-09-16 DIAGNOSIS — I10 Essential (primary) hypertension: Secondary | ICD-10-CM | POA: Insufficient documentation

## 2017-09-16 DIAGNOSIS — M5416 Radiculopathy, lumbar region: Secondary | ICD-10-CM | POA: Insufficient documentation

## 2017-09-16 DIAGNOSIS — Z90710 Acquired absence of both cervix and uterus: Secondary | ICD-10-CM | POA: Insufficient documentation

## 2017-09-16 DIAGNOSIS — K921 Melena: Secondary | ICD-10-CM | POA: Diagnosis not present

## 2017-09-16 DIAGNOSIS — K648 Other hemorrhoids: Secondary | ICD-10-CM | POA: Insufficient documentation

## 2017-09-16 DIAGNOSIS — E119 Type 2 diabetes mellitus without complications: Secondary | ICD-10-CM | POA: Diagnosis not present

## 2017-09-16 DIAGNOSIS — D509 Iron deficiency anemia, unspecified: Secondary | ICD-10-CM | POA: Diagnosis not present

## 2017-09-16 DIAGNOSIS — Z91048 Other nonmedicinal substance allergy status: Secondary | ICD-10-CM | POA: Insufficient documentation

## 2017-09-16 DIAGNOSIS — K5521 Angiodysplasia of colon with hemorrhage: Secondary | ICD-10-CM | POA: Diagnosis not present

## 2017-09-16 HISTORY — PX: HOT HEMOSTASIS: SHX5433

## 2017-09-16 HISTORY — DX: Personal history of urinary (tract) infections: Z87.440

## 2017-09-16 HISTORY — DX: Unspecified osteoarthritis, unspecified site: M19.90

## 2017-09-16 HISTORY — PX: COLONOSCOPY: SHX5424

## 2017-09-16 HISTORY — DX: Anesthesia of skin: R20.0

## 2017-09-16 LAB — GLUCOSE, CAPILLARY: Glucose-Capillary: 122 mg/dL — ABNORMAL HIGH (ref 65–99)

## 2017-09-16 SURGERY — COLONOSCOPY
Anesthesia: Monitor Anesthesia Care

## 2017-09-16 MED ORDER — SODIUM CHLORIDE 0.9 % IJ SOLN
PREFILLED_SYRINGE | INTRAMUSCULAR | Status: DC | PRN
  Administered 2017-09-16: 4 mL

## 2017-09-16 MED ORDER — MEPERIDINE HCL 25 MG/ML IJ SOLN: 6.2500 mg | INTRAMUSCULAR | Status: DC | PRN

## 2017-09-16 MED ORDER — PROPOFOL 10 MG/ML IV BOLUS
INTRAVENOUS | Status: AC
  Filled 2017-09-16: qty 60

## 2017-09-16 MED ORDER — LACTATED RINGERS IV SOLN
INTRAVENOUS | Status: DC
  Administered 2017-09-16: 12:00:00 via INTRAVENOUS
  Administered 2017-09-16: 1000 mL via INTRAVENOUS

## 2017-09-16 MED ORDER — SODIUM CHLORIDE 0.9 % IV SOLN: INTRAVENOUS | Status: DC

## 2017-09-16 MED ORDER — PROPOFOL 10 MG/ML IV BOLUS
INTRAVENOUS | Status: DC | PRN
  Administered 2017-09-16: 80 mg via INTRAVENOUS

## 2017-09-16 MED ORDER — LIDOCAINE 2% (20 MG/ML) 5 ML SYRINGE
INTRAMUSCULAR | Status: DC | PRN
  Administered 2017-09-16: 5 mL via INTRAVENOUS

## 2017-09-16 MED ORDER — LIDOCAINE 2% (20 MG/ML) 5 ML SYRINGE
INTRAMUSCULAR | Status: AC
  Filled 2017-09-16: qty 5

## 2017-09-16 MED ORDER — PROPOFOL 500 MG/50ML IV EMUL
INTRAVENOUS | Status: DC | PRN
  Administered 2017-09-16: 125 ug/kg/min via INTRAVENOUS

## 2017-09-16 MED ORDER — PROMETHAZINE HCL 25 MG/ML IJ SOLN: 6.2500 mg | INTRAMUSCULAR | Status: DC | PRN

## 2017-09-16 MED ORDER — MIDAZOLAM HCL 2 MG/2ML IJ SOLN: 0.5000 mg | Freq: Once | INTRAMUSCULAR | Status: DC | PRN

## 2017-09-16 NOTE — Anesthesia Preprocedure Evaluation (Signed)
Anesthesia Evaluation  Patient identified by MRN, date of birth, ID band Patient awake    Reviewed: Allergy & Precautions, NPO status , Patient's Chart, lab work & pertinent test results  History of Anesthesia Complications Negative for: history of anesthetic complications  Airway Mallampati: II  TM Distance: >3 FB Neck ROM: Full    Dental  (+) Partial Lower, Upper Dentures, Edentulous Upper, Dental Advisory Given   Pulmonary COPD, Current Smoker,    breath sounds clear to auscultation + decreased breath sounds      Cardiovascular hypertension, Pt. on medications (-) angina Rhythm:Regular Rate:Normal     Neuro/Psych negative neurological ROS     GI/Hepatic Neg liver ROS, Lower GI bleeding   Endo/Other  diabetes, Oral Hypoglycemic AgentsHypothyroidism Morbid obesity  Renal/GU negative Renal ROS     Musculoskeletal  (+) Arthritis , Osteoarthritis,    Abdominal (+) + obese,   Peds  Hematology Hb 9.5   Anesthesia Other Findings   Reproductive/Obstetrics                             Anesthesia Physical Anesthesia Plan  ASA: III  Anesthesia Plan: MAC   Post-op Pain Management:    Induction: Intravenous  PONV Risk Score and Plan: 1 and Treatment may vary due to age or medical condition  Airway Management Planned: Natural Airway and Nasal Cannula  Additional Equipment:   Intra-op Plan:   Post-operative Plan:   Informed Consent: I have reviewed the patients History and Physical, chart, labs and discussed the procedure including the risks, benefits and alternatives for the proposed anesthesia with the patient or authorized representative who has indicated his/her understanding and acceptance.   Dental advisory given  Plan Discussed with: CRNA and Surgeon  Anesthesia Plan Comments: (Plan routine monitors, MAC)        Anesthesia Quick Evaluation

## 2017-09-16 NOTE — Progress Notes (Signed)
DR. Loletha Carrow made aware of increased bp with pain in abd. No new orders at this time. Dr. Loletha Carrow feels it will just wear off and get better. Pt encouraged to reposition and given warm blanket to hold hold on stomach. Pt states she feels slightly better. Will continue to assess pt.

## 2017-09-16 NOTE — Anesthesia Postprocedure Evaluation (Signed)
Anesthesia Post Note  Patient: Pamela Cherry  Procedure(s) Performed: COLONOSCOPY (N/A ) HOT HEMOSTASIS (ARGON PLASMA COAGULATION/BICAP) (N/A )     Patient location during evaluation: Endoscopy Anesthesia Type: MAC Level of consciousness: awake and alert, patient cooperative and oriented Pain management: pain level controlled Vital Signs Assessment: post-procedure vital signs reviewed and stable Respiratory status: spontaneous breathing, nonlabored ventilation and respiratory function stable Cardiovascular status: blood pressure returned to baseline and stable Postop Assessment: no apparent nausea or vomiting Anesthetic complications: no    Last Vitals:  Vitals:   09/16/17 1310 09/16/17 1315  BP: (!) 198/81 (!) 163/55  Pulse: 81 80  Resp: 11 11  Temp:    SpO2: 92% 93%    Last Pain:  Vitals:   09/16/17 1310  TempSrc:   PainSc: 2                  Raymie Giammarco,E. Kiamesha Samet

## 2017-09-16 NOTE — Progress Notes (Signed)
DR. Jenita Seashore made aware of elevated bp after epinephrine injection. Will continue to assess over next 15-59min. And see if it comes down on its own. If SBP>160 or DBP >90 will give some labetolol per DR. Marisue Brooklyn order.

## 2017-09-16 NOTE — Discharge Instructions (Signed)
YOU HAD AN ENDOSCOPIC PROCEDURE TODAY: Refer to the procedure report and other information in the discharge instructions given to you for any specific questions about what was found during the examination. If this information does not answer your questions, please call [place] office at [phone] to clarify.  ° °YOU SHOULD EXPECT: Some feelings of bloating in the abdomen. Passage of more gas than usual. Walking can help get rid of the air that was put into your GI tract during the procedure and reduce the bloating. If you had a lower endoscopy (such as a colonoscopy or flexible sigmoidoscopy) you may notice spotting of blood in your stool or on the toilet paper. Some abdominal soreness may be present for a day or two, also. ° °DIET: Your first meal following the procedure should be a light meal and then it is ok to progress to your normal diet. A half-sandwich or bowl of soup is an example of a good first meal. Heavy or fried foods are harder to digest and may make you feel nauseous or bloated. Drink plenty of fluids but you should avoid alcoholic beverages for 24 hours. If you had a esophageal dilation, please see attached instructions for diet.   ° °ACTIVITY: Your care partner should take you home directly after the procedure. You should plan to take it easy, moving slowly for the rest of the day. You can resume normal activity the day after the procedure however YOU SHOULD NOT DRIVE, use power tools, machinery or perform tasks that involve climbing or major physical exertion for 24 hours (because of the sedation medicines used during the test).  ° °SYMPTOMS TO REPORT IMMEDIATELY: °A gastroenterologist can be reached at any hour. Please call [phone]  for any of the following symptoms:  °Following lower endoscopy (colonoscopy, flexible sigmoidoscopy) °Excessive amounts of blood in the stool  °Significant tenderness, worsening of abdominal pains  °Swelling of the abdomen that is new, acute  °Fever of 100° or  higher  °Following upper endoscopy (EGD, EUS, ERCP, esophageal dilation) °Vomiting of blood or coffee ground material  °New, significant abdominal pain  °New, significant chest pain or pain under the shoulder blades  °Painful or persistently difficult swallowing  °New shortness of breath  °Black, tarry-looking or red, bloody stools ° °FOLLOW UP:  °If any biopsies were taken you will be contacted by phone or by letter within the next 1-3 weeks. Call [phone]  if you have not heard about the biopsies in 3 weeks.  °Please also call with any specific questions about appointments or follow up tests. ° °

## 2017-09-16 NOTE — Op Note (Signed)
[facility] Patient Name: Pamela Cherry Procedure Date: 09/16/2017 MRN: [id number] Attending MD: Estill Cotta. 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 Loletha Carrow, MD, Laverta Baltimore RN, RN,                            Alan Mulder, Technician Referring MD:              Medicines:                Monitored Anesthesia Care Complications:            No immediate complications. Estimated Blood Loss:     Estimated blood loss was minimal. Procedure:                Pre-Anesthesia Assessment:                           - Prior to the procedure, a History and Physical                            was performed, and patient medications and                            allergies were reviewed. The patient's tolerance of                            previous anesthesia was also reviewed. The risks                            and benefits of the procedure and the sedation                            options and risks were discussed with the patient.                            All questions were answered, and informed consent                            was obtained. Anticoagulants: The patient has taken                            aspirin. It was decided not to withhold this                            medication prior  to the procedure. ASA Grade                            Assessment: III - A patient with severe systemic                            disease. After reviewing the risks and benefits,                            the patient was deemed in satisfactory condition to                            undergo the procedure.                           After obtaining informed consent, the colonoscope                             was passed under direct vision. Throughout the                            procedure, the patient's blood pressure, pulse, and                            oxygen saturations were monitored continuously. The                            EC-[id number] ([id number]) scope was introduced through                            the anus and advanced to the the cecum, identified                            by appendiceal orifice and ileocecal valve. The                            colonoscopy was performed without difficulty. The                            patient tolerated the procedure well. The quality                            of the bowel preparation was excellent. The                            ileocecal valve, appendiceal orifice, and rectum                            were photographed. The bowel preparation used was                            SUPREP. Scope In: 12:08:17 PM Scope Out: 12:26:09 PM Scope Withdrawal Time: 0 hours 12 minutes 21 seconds  Total Procedure Duration: 0 hours 17 minutes 52 seconds  Findings:      The digital rectal exam findings include decreased sphincter tone.      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 Area was       successfully injected with 4 mL of a 1:10,000 solution of epinephrine       for pre-ablation hemostasis. Coagulation for tissue destruction using       argon plasma at 0.5 liters/minute and 40 watts was successful,       completely ablating the AVM with scant bleeding during the procedure. To       prevent bleeding post-intervention, two hemostatic clips were       successfully placed (MR conditional). There was no bleeding at the end       of the procedure.      As before, a tattoo was seen in the transverse colon.      Internal hemorrhoids were found. The hemorrhoids were Grade I (internal       hemorrhoids that do not prolapse).      The exam was otherwise without abnormality on direct and retroflexion        views. Impression:               - Decreased sphincter tone found on digital rectal                            exam.                           - A single non-bleeding colonic angioectasia.                            Injected. Treated with argon plasma coagulation                            (APC). Clips (MR conditional) were placed.                           - Internal hemorrhoids.                           - The examination was otherwise normal on direct                            and retroflexion views.                           - No specimens collected. Moderate Sedation:      MAC sedation used Recommendation:           - Patient has a contact number available for                            emergencies. The signs and symptoms of potential                            delayed complications were discussed with the                            patient. Return to normal activities tomorrow.  Written discharge instructions were provided to the                            patient.                           - Resume previous diet.                           - No aspirin, ibuprofen, naproxen, or other                            non-steroidal anti-inflammatory drugs for 5 days.                           - Repeat colonoscopy.                           - Return to primary care physician as previously                            scheduled to follow blood work and manage iron                            deficiency. Procedure Code(s):        --- Professional ---                           [phone], Colonoscopy, flexible; with control of                            bleeding, any method                           45381, 59, Colonoscopy, flexible; with directed                            submucosal injection(s), any substance Diagnosis Code(s):        --- Professional ---                           K62.89, Other specified diseases of anus and rectum                           Q27.33,  Arteriovenous malformation of digestive                            system vessel                           D50.9, Iron deficiency anemia, unspecified CPT copyright 2016 [employer]. All rights reserved. The codes documented in this report are preliminary and upon coder review may  be revised to meet current compliance requirements. Jerimey Burridge L. Loletha Carrow, MD 09/16/2017 12:41:05 PM This report has been signed electronically. Number of Addenda: 0

## 2017-09-16 NOTE — Progress Notes (Signed)
bp 176/75 Dr. Glennon Mac is good with this b/p for discharge.

## 2017-09-16 NOTE — Transfer of Care (Signed)
Immediate Anesthesia Transfer of Care Note  Patient: Pamela Cherry  Procedure(s) Performed: COLONOSCOPY (N/A ) HOT HEMOSTASIS (ARGON PLASMA COAGULATION/BICAP) (N/A )  Patient Location: PACU and Endoscopy Unit  Anesthesia Type:MAC  Level of Consciousness: awake, alert  and oriented  Airway & Oxygen Therapy: Patient Spontanous Breathing  Post-op Assessment: Report given to RN and Post -op Vital signs reviewed and stable  Post vital signs: Reviewed and stable  Last Vitals:  Vitals:   09/16/17 1136  BP: (!) 159/61  Pulse: 82  Resp: 13  Temp: 36.8 C  SpO2: 93%    Last Pain:  Vitals:   09/16/17 1136  TempSrc: Oral         Complications: No apparent anesthesia complications

## 2017-09-16 NOTE — Progress Notes (Signed)
Dr. Loletha Carrow by to see pt. Pt c/o abdominal pain. Dr. Loletha Carrow examined pt and feels it is from epinephrine injection during the procedure. Dr. Loletha Carrow stated it will wear off.  Pt abdomin soft to touch.
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
## 2017-09-16 NOTE — Interval H&P Note (Signed)
History and Physical Interval Note:  09/16/2017 11:47 AM  Pamela Cherry  has presented today for surgery, with the diagnosis of Heme + stool, IDA, cecal AVM  The various methods of treatment have been discussed with the patient and family. After consideration of risks, benefits and other options for treatment, the patient has consented to  Procedure(s): COLONOSCOPY (N/A) HOT HEMOSTASIS (ARGON PLASMA COAGULATION/BICAP) (N/A) as a surgical intervention .  The patient's history has been reviewed, patient examined, no change in status, stable for surgery.  I have reviewed the patient's chart and labs.  Questions were answered to the patient's satisfaction.     Nelida Meuse III

## 2017-09-19 ENCOUNTER — Encounter (HOSPITAL_COMMUNITY): Payer: Self-pay | Admitting: Gastroenterology

## 2017-09-30 ENCOUNTER — Ambulatory Visit (INDEPENDENT_AMBULATORY_CARE_PROVIDER_SITE_OTHER): Payer: Medicare HMO | Admitting: Family Medicine

## 2017-09-30 DIAGNOSIS — Z23 Encounter for immunization: Secondary | ICD-10-CM | POA: Diagnosis not present

## 2017-11-02 ENCOUNTER — Ambulatory Visit: Payer: Medicare HMO | Admitting: Family Medicine

## 2017-11-02 ENCOUNTER — Encounter: Payer: Self-pay | Admitting: Family Medicine

## 2017-11-02 VITALS — BP 170/80 | HR 92 | Temp 98.4°F | Resp 18 | Ht 64.0 in | Wt 231.0 lb

## 2017-11-02 DIAGNOSIS — M5416 Radiculopathy, lumbar region: Secondary | ICD-10-CM

## 2017-11-02 DIAGNOSIS — I10 Essential (primary) hypertension: Secondary | ICD-10-CM | POA: Diagnosis not present

## 2017-11-02 DIAGNOSIS — M25511 Pain in right shoulder: Secondary | ICD-10-CM

## 2017-11-02 MED ORDER — CLORAZEPATE DIPOTASSIUM 7.5 MG PO TABS: 7.5000 mg | ORAL_TABLET | Freq: Every evening | ORAL | 1 refills | Status: DC | PRN

## 2017-11-02 MED ORDER — LISINOPRIL-HYDROCHLOROTHIAZIDE 20-12.5 MG PO TABS: 1.0000 | ORAL_TABLET | Freq: Every day | ORAL | 3 refills | Status: DC

## 2017-11-02 MED ORDER — GABAPENTIN 300 MG PO CAPS: 300.0000 mg | ORAL_CAPSULE | Freq: Three times a day (TID) | ORAL | 3 refills | Status: DC
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
## 2017-11-02 NOTE — Addendum Note (Signed)
Addended by: Shary Decamp B on: 11/02/2017 10:38 AM   Modules accepted: Orders

## 2017-11-13 ENCOUNTER — Other Ambulatory Visit: Payer: Medicare HMO

## 2017-11-29 ENCOUNTER — Telehealth: Payer: Self-pay

## 2017-11-29 DIAGNOSIS — R8290 Unspecified abnormal findings in urine: Secondary | ICD-10-CM | POA: Diagnosis not present

## 2017-11-29 DIAGNOSIS — R42 Dizziness and giddiness: Secondary | ICD-10-CM | POA: Diagnosis not present

## 2017-11-29 DIAGNOSIS — R309 Painful micturition, unspecified: Secondary | ICD-10-CM | POA: Diagnosis not present

## 2017-11-29 NOTE — Telephone Encounter (Signed)
Her blood pressure was elevated. Therefore I do not believe her blood pressure was causing her to feel dizzy.  We can certainly try only having her take half the medicine over the weekend. I would like to see her Monday to see if her dizziness is improving on less medication. If not, we will need to undertake further workup.

## 2017-11-29 NOTE — Telephone Encounter (Signed)
Pamela Cherry called from [facility] urgent care confirming blood pressure medicine patient was on because patient went to their office feeling dizzy. Pamela Cherry states the physician at urgent care thinks it may be the  blood pressure medicine causing her to feel dizzy her blood pressure at urgent care was 152/70 and blood sugar 177.They informed patient to take only half of the blood pressure medicine until you decide on what you would like for her to do  Pls advise
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
## 2017-11-30 NOTE — Telephone Encounter (Signed)
Spoke with patient she is scheduled to come in 12/24

## 2017-12-05 ENCOUNTER — Encounter: Payer: Self-pay | Admitting: Family Medicine

## 2017-12-05 ENCOUNTER — Ambulatory Visit: Payer: Medicare HMO | Admitting: Family Medicine

## 2017-12-05 VITALS — BP 140/76 | HR 90 | Temp 98.6°F | Resp 16 | Ht 64.0 in | Wt 217.0 lb

## 2017-12-05 DIAGNOSIS — R42 Dizziness and giddiness: Secondary | ICD-10-CM | POA: Diagnosis not present

## 2017-12-05 DIAGNOSIS — D649 Anemia, unspecified: Secondary | ICD-10-CM | POA: Diagnosis not present
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
## 2017-12-09 LAB — COMPLETE METABOLIC PANEL WITH GFR
AG Ratio: 1.5 (calc) (ref 1.0–2.5)
ALT: 13 U/L (ref 6–29)
AST: 20 U/L (ref 10–35)
Albumin: 3.6 g/dL (ref 3.6–5.1)
Alkaline phosphatase (APISO): 85 U/L (ref 33–130)
BUN: 11 mg/dL (ref 7–25)
CALCIUM: 9.1 mg/dL (ref 8.6–10.4)
CO2: 26 mmol/L (ref 20–32)
Chloride: 105 mmol/L (ref 98–110)
Creat: 0.7 mg/dL (ref 0.50–0.99)
GFR, EST NON AFRICAN AMERICAN: 89 mL/min/{1.73_m2} (ref >=60)
GFR, Est African American: 103 mL/min/{1.73_m2} (ref >=60)
GLUCOSE: 130 mg/dL — AB (ref 65–99)
Globulin: 2.4 g/dL (calc) (ref 1.9–3.7)
POTASSIUM: 4.2 mmol/L (ref 3.5–5.3)
Sodium: 139 mmol/L (ref 135–146)
Total Bilirubin: 0.3 mg/dL (ref 0.2–1.2)
Total Protein: 6 g/dL — ABNORMAL LOW (ref 6.1–8.1)

## 2017-12-09 LAB — CBC WITH DIFFERENTIAL/PLATELET
Basophils Absolute: 49 cells/uL (ref 0–200)
Basophils Relative: 0.6 %
EOS PCT: 2.8 %
Eosinophils Absolute: 227 cells/uL (ref 15–500)
HCT: 31.2 % — ABNORMAL LOW (ref 35.0–45.0)
Hemoglobin: 9.5 g/dL — ABNORMAL LOW (ref 11.7–15.5)
LYMPHS ABS: 1612 {cells}/uL (ref 850–3900)
MCH: 21.8 pg — ABNORMAL LOW (ref 27.0–33.0)
MCHC: 30.4 g/dL — ABNORMAL LOW (ref 32.0–36.0)
MCV: 71.6 fL — ABNORMAL LOW (ref 80.0–100.0)
MONOS PCT: 5.5 %
MPV: 11 fL (ref 7.5–12.5)
NEUTROS PCT: 71.2 %
Neutro Abs: 5767 cells/uL (ref 1500–7800)
PLATELETS: 264 10*3/uL (ref 140–400)
RBC: 4.36 10*6/uL (ref 3.80–5.10)
RDW: 20.3 % — AB (ref 11.0–15.0)
TOTAL LYMPHOCYTE: 19.9 %
WBC mixed population: 446 cells/uL (ref 200–950)
WBC: 8.1 10*3/uL (ref 3.8–10.8)

## 2017-12-09 LAB — TEST AUTHORIZATION

## 2017-12-09 LAB — IRON, TOTAL/TOTAL IRON BINDING CAP
%SAT: 23 % (calc) (ref 11–50)
IRON: 89 ug/dL (ref 45–160)
TIBC: 395 ug/dL (ref 250–450)

## 2017-12-16 ENCOUNTER — Other Ambulatory Visit: Payer: Self-pay | Admitting: Family Medicine

## 2017-12-16 DIAGNOSIS — D649 Anemia, unspecified: Secondary | ICD-10-CM

## 2017-12-16 DIAGNOSIS — K921 Melena: Secondary | ICD-10-CM

## 2017-12-19 DIAGNOSIS — K921 Melena: Secondary | ICD-10-CM | POA: Diagnosis not present

## 2017-12-19 DIAGNOSIS — D649 Anemia, unspecified: Secondary | ICD-10-CM | POA: Diagnosis not present

## 2017-12-22 DIAGNOSIS — K921 Melena: Secondary | ICD-10-CM | POA: Diagnosis not present

## 2017-12-22 DIAGNOSIS — D649 Anemia, unspecified: Secondary | ICD-10-CM | POA: Diagnosis not present

## 2017-12-24 DIAGNOSIS — D649 Anemia, unspecified: Secondary | ICD-10-CM | POA: Diagnosis not present

## 2017-12-24 DIAGNOSIS — K921 Melena: Secondary | ICD-10-CM | POA: Diagnosis not present

## 2017-12-28 ENCOUNTER — Other Ambulatory Visit: Payer: Medicare HMO

## 2017-12-28 DIAGNOSIS — K921 Melena: Secondary | ICD-10-CM

## 2017-12-28 DIAGNOSIS — D649 Anemia, unspecified: Secondary | ICD-10-CM

## 2017-12-29 LAB — FECAL GLOBIN BY IMMUNOCHEMISTRY
FECAL GLOBIN RESULT: DETECTED — AB
FECAL GLOBIN RESULT: DETECTED — AB
FECAL GLOBIN RESULT: DETECTED — AB
MICRO NUMBER: 90066333
MICRO NUMBER:: 90066336
MICRO NUMBER:: 90066334
SPECIMEN QUALITY:: ADEQUATE
SPECIMEN QUALITY:: ADEQUATE
SPECIMEN QUALITY:: ADEQUATE

## 2018-01-03 ENCOUNTER — Telehealth: Payer: Self-pay | Admitting: Family Medicine

## 2018-01-03 NOTE — Telephone Encounter (Signed)
I think 2 weeks is sufficient.

## 2018-01-03 NOTE — Telephone Encounter (Signed)
Patient called upset that I haven't worked on her referral for GI. I explained to patient that I didn't have a referral to work on I do see under the lab results that Dr. Dennard Schaumann recommended GI referral since there is blood in stool. I called [facility] while patient was on hold and made an appointment for 01/17/2018 at 10:30 with Amy the PA to Dr. Loletha Carrow. I advised patient of the appointment she said "two weeks" and sighed. I told her that I would send a message to Dr. Dennard Schaumann advising him of the appointment and to make sure the two weeks was okay.    Please advise. CB# [phone]

## 2018-01-10 ENCOUNTER — Ambulatory Visit: Payer: Medicare HMO | Admitting: Family Medicine

## 2018-01-12 ENCOUNTER — Encounter: Payer: Self-pay | Admitting: Family Medicine

## 2018-01-12 ENCOUNTER — Ambulatory Visit (INDEPENDENT_AMBULATORY_CARE_PROVIDER_SITE_OTHER): Payer: Medicare HMO | Admitting: Family Medicine

## 2018-01-12 VITALS — BP 156/70 | HR 96 | Temp 98.4°F | Resp 18 | Ht 64.0 in | Wt 222.0 lb

## 2018-01-12 DIAGNOSIS — E119 Type 2 diabetes mellitus without complications: Secondary | ICD-10-CM | POA: Diagnosis not present
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
## 2018-01-17 ENCOUNTER — Ambulatory Visit: Payer: Medicare HMO | Admitting: Physician Assistant

## 2018-01-18 ENCOUNTER — Ambulatory Visit: Payer: Medicare HMO | Admitting: Gastroenterology

## 2018-01-18 ENCOUNTER — Encounter: Payer: Self-pay | Admitting: Gastroenterology

## 2018-01-18 VITALS — BP 154/68 | HR 90 | Ht 64.0 in | Wt 224.0 lb

## 2018-01-18 DIAGNOSIS — R195 Other fecal abnormalities: Secondary | ICD-10-CM

## 2018-01-18 DIAGNOSIS — D500 Iron deficiency anemia secondary to blood loss (chronic): Secondary | ICD-10-CM | POA: Diagnosis not present

## 2018-01-18 NOTE — Patient Instructions (Signed)
CAPSULE ENDOSCOPY PATIENT INSTRUCTION SHEET  Pamela Cherry 1950/12/14 [id number]   1. 01-19-2018 Seven (7) days prior to capsule endoscopy stop taking iron supplements and carafate.  2. 01-24-2018 Two (2) days prior to capsule endoscopy stop taking aspirin or any arthritis drugs.  3. 01-25-2018 Day before capsule endoscopy purchase a 238 gram bottle of Miralax from the laxative section of your drug store, and a 32 oz. bottle of Gatorade (no red).    4. 01-25-2018 One (1) day prior to capsule endoscopy: a) Stop smoking. b) Eat a regular diet until 12:00 Noon. c) After 12:00 Noon take only the following: Black coffee  Jell-O (no fruit or red Jell-o) Water   Bouillon (chicken or beef) 7-Up   Cranberry Juice Tea   Kool-Aid Popsicle (not red) Sprite   Coke Ginger Ale  Pepsi Mountain Dew Gatorade d) At 6:00 pm the evening before your appointment, drink 7 capfuls (105 grams) of Miralax with 32 oz. Gatorade. Drink 8 oz every 15 minutes until gone. e) Nothing to eat or drink after midnight except medications with a sip of water.  5. 01-26-2018 Day of capsule endoscopy: Wear loose two-piece clothing to your exam. No medications for 2 hours prior to your test.  6. Please arrive at [place] in at patient registration at [place].   For any questions:  Call [facility] at [phone] and ask to speak with one of the capsule endoscopy nurses.                                                           To cancel or reschedule call [phone]                                                                        If you are age 60 or older, your body mass index should be between 23-30. Your Body mass index is 38.45 kg/m. If this is out of the aforementioned range listed, please consider follow up with your Primary Care Provider.  If you are age 38 or younger, your body mass index should be between 19-25. Your Body mass index is 38.45 kg/m. If this is out of the aformentioned  range listed, please consider follow up with your Primary Care Provider.   Thank you for choosing [facility]  Dr Wilfrid Lund III

## 2018-01-18 NOTE — Progress Notes (Signed)
[facility] Progress Note  Chief Complaint: GI bleeding  Subjective  History:  This is a 67 year old woman known to me from evaluation several months ago.  At that time, she was referred by Dr. Dennard Schaumann for iron deficiency anemia and heme-positive stool.  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  She underwent follow-up colonoscopy October 2018 with ablation of the AVM.  Grade 1 internal hemorrhoids were discovered as well as an adenomatous and hyperplastic polyp. Says more stool tests positive. Lightheaded - he suspected from BP meds.  She was sent for repeat evaluation by Dr. Dennard Schaumann for anemia that is not responding to once daily oral iron therapy and continued heme positive stool on repeat check. As before, she denies abdominal pain, altered bowel habits, black or bloody stool.  She denies anorexia, nausea, vomiting or weight loss.  ROS: Cardiovascular:  no chest pain Respiratory: no dyspnea Positive for intermittent cough, chronic fatigue and arthralgias. Remainder systems negative except as above in history The patient's Past Medical, Family and Social History were reviewed and are on file in the EMR.  Objective:  Med list reviewed  Current Outpatient Medications:  .  aspirin EC 81 MG tablet, Take 81 mg by mouth daily., Disp: , Rfl:  .  calcium carbonate (CALCIUM 600) 600 MG TABS tablet, Take 600 mg by mouth daily. , Disp: , Rfl:  .  clorazepate (TRANXENE) 7.5 MG tablet, Take 1 tablet (7.5 mg total) by mouth at bedtime as needed for anxiety or sleep., Disp: 90 tablet, Rfl: 1 .  gabapentin (NEURONTIN) 300 MG capsule, Take 1 capsule (300 mg total) by mouth 3 (three) times daily., Disp: 270 capsule, Rfl: 3 .  glipiZIDE (GLUCOTROL XL) 10 MG 24 hr tablet, Take 1 tablet (10 mg total) by mouth daily with breakfast., Disp: 90 tablet, Rfl: 1 .  levothyroxine (SYNTHROID, LEVOTHROID) 112 MCG tablet, Take 1 tablet (112 mcg  total) by mouth daily., Disp: 90 tablet, Rfl: 3 .  lisinopril (PRINIVIL,ZESTRIL) 10 MG tablet, Take 10 mg by mouth daily., Disp: , Rfl:  .  metFORMIN (GLUCOPHAGE) 1000 MG tablet, Take 1 tablet (1,000 mg total) by mouth 2 (two) times daily with a meal., Disp: 180 tablet, Rfl: 3 .  Multiple Vitamin (MULTI-VITAMINS) TABS, Take 1 tablet by mouth daily. , Disp: , Rfl:  .  pioglitazone (ACTOS) 30 MG tablet, Take 1 tablet (30 mg total) by mouth daily., Disp: 90 tablet, Rfl: 3 .  pravastatin (PRAVACHOL) 40 MG tablet, Take 1 tablet (40 mg total) by mouth daily., Disp: 90 tablet, Rfl: 3 .  ferrous sulfate 325 (65 FE) MG EC tablet, Take 1 tablet (325 mg total) by mouth 3 (three) times daily with meals., Disp: 90 tablet, Rfl: 4   Vital signs in last 24 hrs: Vitals:   01/18/18 0818  BP: (!) 154/68  Pulse: 90    Physical Exam   HEENT: sclera anicteric, oral mucosa moist without lesions  Neck: supple, no thyromegaly, JVD or lymphadenopathy  Cardiac: RRR without murmurs, S1S2 heard, no peripheral edema  Pulm: clear to auscultation bilaterally, normal RR and effort noted  Abdomen: soft, no tenderness, with active bowel sounds. No guarding or palpable hepatosplenomegaly.  Skin; warm and dry, no jaundice or rash, no pallor  Recent Labs:  CBC Latest Ref Rng & Units 12/05/2017 09/04/2017 08/05/2017  WBC 3.8 - 10.8 Thousand/uL 8.1 8.1 8.0  Hemoglobin 11.7 - 15.5 g/dL 9.5(L)  9.5(L) 10.0(L)  Hematocrit 35.0 - 45.0 % 31.2(L) 31.9(L) 32.6(L)  Platelets 140 - 400 Thousand/uL 264 279 273.0   Iron 89  tibc 395  23% sat  On 12/05/17   Radiologic studies: None  @ASSESSMENTPLANBEGIN @ Assessment: Encounter Diagnoses  Name Primary?  . Heme positive stool Yes  . Iron deficiency anemia due to chronic blood loss     This problem continues after ablation of the cecal AVM, indicating probable small bowel source of occult blood loss.  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  Plan: Increase iron tablets to 3 times daily Small bowel video capsule study ordered.  The procedure was described in detail including the a proximally 1% risk of capsule retention with the need for surgery.  She is agreeable.   Total time 25 minutes, over half spent in counseling and coordination of care.   Nelida Meuse III

## 2018-01-24 ENCOUNTER — Telehealth: Payer: Self-pay | Admitting: Gastroenterology

## 2018-01-24 NOTE — Telephone Encounter (Signed)
Cancelled patient's capsule study, due to costs.

## 2018-01-24 NOTE — Telephone Encounter (Signed)
Understood 

## 2018-01-26 ENCOUNTER — Ambulatory Visit (HOSPITAL_COMMUNITY): Admission: RE | Admit: 2018-01-26 | Payer: Medicare HMO | Source: Ambulatory Visit | Admitting: Gastroenterology

## 2018-01-26 ENCOUNTER — Encounter (HOSPITAL_COMMUNITY): Admission: RE | Payer: Self-pay | Source: Ambulatory Visit

## 2018-01-26 SURGERY — IMAGING PROCEDURE, GI TRACT, INTRALUMINAL, VIA CAPSULE
Anesthesia: LOCAL

## 2018-02-02 DIAGNOSIS — E119 Type 2 diabetes mellitus without complications: Secondary | ICD-10-CM | POA: Diagnosis not present

## 2018-02-02 DIAGNOSIS — Z89511 Acquired absence of right leg below knee: Secondary | ICD-10-CM | POA: Diagnosis not present

## 2018-02-06 ENCOUNTER — Encounter (INDEPENDENT_AMBULATORY_CARE_PROVIDER_SITE_OTHER): Payer: Self-pay | Admitting: Orthopedic Surgery

## 2018-02-06 ENCOUNTER — Ambulatory Visit (INDEPENDENT_AMBULATORY_CARE_PROVIDER_SITE_OTHER): Payer: Medicare HMO | Admitting: Orthopedic Surgery

## 2018-02-06 VITALS — Ht 64.0 in | Wt 224.0 lb

## 2018-02-06 DIAGNOSIS — S88911A Complete traumatic amputation of right lower leg, level unspecified, initial encounter: Secondary | ICD-10-CM

## 2018-02-06 DIAGNOSIS — S98011A Complete traumatic amputation of right foot at ankle level, initial encounter: Secondary | ICD-10-CM
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
## 2018-04-10 ENCOUNTER — Ambulatory Visit: Payer: Medicare HMO | Admitting: Family Medicine

## 2018-04-11 ENCOUNTER — Ambulatory Visit (INDEPENDENT_AMBULATORY_CARE_PROVIDER_SITE_OTHER): Payer: Medicare HMO | Admitting: Family Medicine

## 2018-04-11 ENCOUNTER — Encounter: Payer: Self-pay | Admitting: Family Medicine

## 2018-04-11 DIAGNOSIS — D649 Anemia, unspecified: Secondary | ICD-10-CM

## 2018-04-11 DIAGNOSIS — R7309 Other abnormal glucose: Secondary | ICD-10-CM | POA: Diagnosis not present

## 2018-04-11 MED ORDER — CYCLOBENZAPRINE HCL 10 MG PO TABS: 10.0000 mg | ORAL_TABLET | Freq: Three times a day (TID) | ORAL | 0 refills | Status: DC | PRN
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
## 2018-04-12 LAB — COMPLETE METABOLIC PANEL WITH GFR
AG RATIO: 1.3 (calc) (ref 1.0–2.5)
ALBUMIN MSPROF: 3.6 g/dL (ref 3.6–5.1)
ALT: 8 U/L (ref 6–29)
AST: 19 U/L (ref 10–35)
Alkaline phosphatase (APISO): 93 U/L (ref 33–130)
BILIRUBIN TOTAL: 0.3 mg/dL (ref 0.2–1.2)
BUN: 9 mg/dL (ref 7–25)
CHLORIDE: 103 mmol/L (ref 98–110)
CO2: 26 mmol/L (ref 20–32)
Calcium: 8.8 mg/dL (ref 8.6–10.4)
Creat: 0.71 mg/dL (ref 0.50–0.99)
GFR, EST AFRICAN AMERICAN: 101 mL/min/{1.73_m2} (ref >=60)
GFR, Est Non African American: 88 mL/min/{1.73_m2} (ref >=60)
GLOBULIN: 2.7 g/dL (ref 1.9–3.7)
Glucose, Bld: 260 mg/dL — ABNORMAL HIGH (ref 65–99)
POTASSIUM: 3.8 mmol/L (ref 3.5–5.3)
Sodium: 139 mmol/L (ref 135–146)
TOTAL PROTEIN: 6.3 g/dL (ref 6.1–8.1)

## 2018-04-12 LAB — CBC WITH DIFFERENTIAL/PLATELET
BASOS PCT: 0.6 %
Basophils Absolute: 39 cells/uL (ref 0–200)
Eosinophils Absolute: 169 cells/uL (ref 15–500)
Eosinophils Relative: 2.6 %
HCT: 30.9 % — ABNORMAL LOW (ref 35.0–45.0)
Hemoglobin: 9 g/dL — ABNORMAL LOW (ref 11.7–15.5)
Lymphs Abs: 1411 cells/uL (ref 850–3900)
MCH: 19.3 pg — ABNORMAL LOW (ref 27.0–33.0)
MCHC: 29.1 g/dL — AB (ref 32.0–36.0)
MCV: 66.2 fL — AB (ref 80.0–100.0)
MONOS PCT: 6 %
MPV: 10.9 fL (ref 7.5–12.5)
NEUTROS PCT: 69.1 %
Neutro Abs: 4492 cells/uL (ref 1500–7800)
PLATELETS: 294 10*3/uL (ref 140–400)
RBC: 4.67 10*6/uL (ref 3.80–5.10)
RDW: 19.6 % — AB (ref 11.0–15.0)
Total Lymphocyte: 21.7 %
WBC mixed population: 390 cells/uL (ref 200–950)
WBC: 6.5 10*3/uL (ref 3.8–10.8)

## 2018-04-12 LAB — CBC MORPHOLOGY

## 2018-04-13 ENCOUNTER — Telehealth: Payer: Self-pay | Admitting: Family Medicine

## 2018-04-13 LAB — TEST AUTHORIZATION

## 2018-04-13 MED ORDER — TIZANIDINE HCL 4 MG PO TABS: 4.0000 mg | ORAL_TABLET | Freq: Three times a day (TID) | ORAL | 0 refills | Status: DC

## 2018-04-13 NOTE — Telephone Encounter (Signed)
Ins will not cover Flexeril - call pharm and changed med to Tizanidine - pt aware.

## 2018-04-14 LAB — HEMOGLOBIN A1C W/OUT EAG: Hgb A1c MFr Bld: 7.8 % of total Hgb — ABNORMAL HIGH (ref <5.7)

## 2018-04-18 ENCOUNTER — Other Ambulatory Visit: Payer: Self-pay | Admitting: Family Medicine

## 2018-04-18 DIAGNOSIS — M549 Dorsalgia, unspecified: Secondary | ICD-10-CM

## 2018-04-18 MED ORDER — SITAGLIPTIN PHOSPHATE 100 MG PO TABS: 100.0000 mg | ORAL_TABLET | Freq: Every day | ORAL | 3 refills | Status: DC

## 2018-04-19 ENCOUNTER — Telehealth: Payer: Self-pay | Admitting: Family Medicine

## 2018-04-19 NOTE — Telephone Encounter (Signed)
Pt called and states that the Januvia is 195$ every 3 months and she can not afford that. Recommendations?   *Needs refill on Glipizide if not change in mg's*

## 2018-04-20 NOTE — Telephone Encounter (Signed)
I have no other generic options (on metformin, glipizide, and actos).  We could start novolin 70/30 (insulin) which is cheaper but she will have to check her sugars 2-3 times a day.

## 2018-04-21 MED ORDER — GLIPIZIDE ER 10 MG PO TB24: 10.0000 mg | ORAL_TABLET | Freq: Every day | ORAL | 1 refills | Status: DC

## 2018-04-21 NOTE — Telephone Encounter (Signed)
Pt aware of recommendations and refuses to take insulin. She will see what she can do about the new med. Refill for glipizide sent to pharm.

## 2018-04-28 ENCOUNTER — Telehealth: Payer: Self-pay | Admitting: Family Medicine

## 2018-04-28 NOTE — Telephone Encounter (Signed)
[employer] faxed paperwork for tier exception for Januvia - paperwork filled out and faxed back to [facility]

## 2018-05-02 ENCOUNTER — Ambulatory Visit: Payer: Medicare HMO | Attending: Family Medicine | Admitting: Physical Therapy

## 2018-05-02 DIAGNOSIS — M545 Low back pain, unspecified: Secondary | ICD-10-CM

## 2018-05-02 DIAGNOSIS — R252 Cramp and spasm: Secondary | ICD-10-CM | POA: Diagnosis not present

## 2018-05-02 DIAGNOSIS — R262 Difficulty in walking, not elsewhere classified: Secondary | ICD-10-CM | POA: Diagnosis not present

## 2018-05-02 DIAGNOSIS — M6281 Muscle weakness (generalized): Secondary | ICD-10-CM | POA: Diagnosis not present

## 2018-05-02 DIAGNOSIS — M542 Cervicalgia: Secondary | ICD-10-CM | POA: Diagnosis not present

## 2018-05-02 NOTE — Telephone Encounter (Signed)
Received Tier Exception Determination.   Exception request denied.   Medicare Part D denied request for brand name or biological drug at a lower cost level. Medicare allows a tiering exception so long as there is a lower cost brand name or biological drug for treating the same condition on the formulary. There is no alternative to treat your condition on the formulary.

## 2018-05-02 NOTE — Patient Instructions (Signed)
Lower Trunk Rotation Stretch    Keeping back flat and feet together, rotate knees to left side. Hold _15___ seconds. Repeat __10__ times per set. Do __1__ sets per session. Do ___2_ sessions per day.  http://orth.exer.us/123   Copyright  VHI. All rights reserved.    Knee to Chest (Flexion)    Pull knee toward chest. Feel stretch in lower back or buttock area. Breathing deeply, Hold _20-30___ seconds. Repeat with other knee. Repeat _3___ times. Do __2__ sessions per day.  http://gt2.exer.us/226   Copyright  VHI. All rights reserved.   HIP: Hamstrings - Short Sitting    Rest leg on raised surface. Keep knee straight. Lift chest. Hold __30 seconds. __3_ reps per set, _1__ sets per day, _7__ days per week  Copyright  VHI. All rights reserved.

## 2018-05-03 ENCOUNTER — Other Ambulatory Visit: Payer: Self-pay | Admitting: *Deleted
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
## 2018-05-03 NOTE — Telephone Encounter (Signed)
Received fax requesting refill on Zanaflex to mail order.   Ok to refill? 

## 2018-05-04 ENCOUNTER — Ambulatory Visit: Payer: Medicare HMO | Admitting: Physical Therapy

## 2018-05-04 DIAGNOSIS — M545 Low back pain, unspecified: Secondary | ICD-10-CM

## 2018-05-04 DIAGNOSIS — M542 Cervicalgia: Secondary | ICD-10-CM

## 2018-05-04 DIAGNOSIS — R262 Difficulty in walking, not elsewhere classified: Secondary | ICD-10-CM | POA: Diagnosis not present

## 2018-05-04 DIAGNOSIS — M6281 Muscle weakness (generalized): Secondary | ICD-10-CM | POA: Diagnosis not present

## 2018-05-04 DIAGNOSIS — R252 Cramp and spasm: Secondary | ICD-10-CM | POA: Diagnosis not present

## 2018-05-04 MED ORDER — TIZANIDINE HCL 4 MG PO TABS: 4.0000 mg | ORAL_TABLET | Freq: Three times a day (TID) | ORAL | 0 refills | Status: DC
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
## 2018-05-09 ENCOUNTER — Encounter: Payer: Self-pay | Admitting: Physical Therapy

## 2018-05-09 ENCOUNTER — Ambulatory Visit: Payer: Medicare HMO | Admitting: Physical Therapy

## 2018-05-09 DIAGNOSIS — M545 Low back pain, unspecified: Secondary | ICD-10-CM

## 2018-05-09 DIAGNOSIS — M542 Cervicalgia: Secondary | ICD-10-CM | POA: Diagnosis not present

## 2018-05-09 DIAGNOSIS — R252 Cramp and spasm: Secondary | ICD-10-CM | POA: Diagnosis not present

## 2018-05-09 DIAGNOSIS — R262 Difficulty in walking, not elsewhere classified: Secondary | ICD-10-CM

## 2018-05-09 DIAGNOSIS — M6281 Muscle weakness (generalized): Secondary | ICD-10-CM | POA: Diagnosis not present
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
## 2018-05-16 ENCOUNTER — Ambulatory Visit: Payer: Medicare HMO | Admitting: Physical Therapy

## 2018-05-18 ENCOUNTER — Ambulatory Visit: Payer: Medicare HMO | Attending: Family Medicine | Admitting: Physical Therapy

## 2018-05-18 ENCOUNTER — Encounter: Payer: Self-pay | Admitting: Physical Therapy

## 2018-05-18 DIAGNOSIS — R262 Difficulty in walking, not elsewhere classified: Secondary | ICD-10-CM

## 2018-05-18 DIAGNOSIS — M6281 Muscle weakness (generalized): Secondary | ICD-10-CM | POA: Diagnosis not present

## 2018-05-18 DIAGNOSIS — R252 Cramp and spasm: Secondary | ICD-10-CM

## 2018-05-18 DIAGNOSIS — M545 Low back pain, unspecified: Secondary | ICD-10-CM

## 2018-05-18 DIAGNOSIS — M542 Cervicalgia: Secondary | ICD-10-CM | POA: Diagnosis not present
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
## 2018-05-18 NOTE — Patient Instructions (Signed)
SHOULDER: Flexion - Supine (Cane)    Hold cane in both hands. Raise arms up overhead. Do not allow back to arch. Hold _1__ seconds. _10__ reps per set, _1__ sets per day, __5_ days per week   Copyright  VHI. All rights reserved.  Supine cane series issued from ex drawer. ALL issued Daily 1-2 x a day 5 days a week 1 second hold.

## 2018-05-23 ENCOUNTER — Ambulatory Visit: Payer: Medicare HMO | Admitting: Physical Therapy

## 2018-05-25 ENCOUNTER — Ambulatory Visit: Payer: Medicare HMO | Admitting: Physical Therapy

## 2018-05-30 ENCOUNTER — Encounter: Payer: Self-pay | Admitting: Physical Therapy

## 2018-05-30 ENCOUNTER — Ambulatory Visit: Payer: Medicare HMO | Admitting: Physical Therapy

## 2018-05-30 DIAGNOSIS — M6281 Muscle weakness (generalized): Secondary | ICD-10-CM

## 2018-05-30 DIAGNOSIS — R262 Difficulty in walking, not elsewhere classified: Secondary | ICD-10-CM

## 2018-05-30 DIAGNOSIS — M545 Low back pain, unspecified: Secondary | ICD-10-CM

## 2018-05-30 DIAGNOSIS — M542 Cervicalgia: Secondary | ICD-10-CM | POA: Diagnosis not present

## 2018-05-30 DIAGNOSIS — R252 Cramp and spasm: Secondary | ICD-10-CM | POA: Diagnosis not present
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
## 2018-06-01 ENCOUNTER — Ambulatory Visit: Payer: Medicare HMO | Admitting: Physical Therapy

## 2018-06-01 ENCOUNTER — Encounter: Payer: Self-pay | Admitting: Physical Therapy

## 2018-06-01 DIAGNOSIS — M6281 Muscle weakness (generalized): Secondary | ICD-10-CM

## 2018-06-01 DIAGNOSIS — M545 Low back pain, unspecified: Secondary | ICD-10-CM

## 2018-06-01 DIAGNOSIS — M542 Cervicalgia: Secondary | ICD-10-CM

## 2018-06-01 DIAGNOSIS — R252 Cramp and spasm: Secondary | ICD-10-CM

## 2018-06-01 DIAGNOSIS — R262 Difficulty in walking, not elsewhere classified: Secondary | ICD-10-CM | POA: Diagnosis not present
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
## 2018-06-06 ENCOUNTER — Ambulatory Visit: Payer: Medicare HMO | Admitting: Physical Therapy

## 2018-09-08 ENCOUNTER — Ambulatory Visit (INDEPENDENT_AMBULATORY_CARE_PROVIDER_SITE_OTHER): Payer: Medicare HMO

## 2018-09-08 DIAGNOSIS — Z23 Encounter for immunization: Secondary | ICD-10-CM | POA: Diagnosis not present

## 2018-09-08 NOTE — Progress Notes (Signed)
Patient came in today to receive annual flu vaccine. Patient was given Fluzone in the right deltoid. She tolerated well. VIS given.

## 2018-09-28 DIAGNOSIS — Z89511 Acquired absence of right leg below knee: Secondary | ICD-10-CM | POA: Diagnosis not present

## 2018-11-29 ENCOUNTER — Telehealth: Payer: Self-pay | Admitting: Family Medicine

## 2018-11-29 NOTE — Telephone Encounter (Signed)
Patient called in requesting a referral to neurologist. Advised patient that she will need an office visit to discuss with Dr. Dennard Schaumann. Patient scheduled for an appointment

## 2018-11-30 ENCOUNTER — Encounter: Payer: Self-pay | Admitting: Family Medicine

## 2018-11-30 ENCOUNTER — Ambulatory Visit (INDEPENDENT_AMBULATORY_CARE_PROVIDER_SITE_OTHER): Payer: Medicare HMO | Admitting: Family Medicine

## 2018-11-30 VITALS — BP 140/70 | HR 84 | Temp 98.1°F | Resp 16 | Ht 64.0 in | Wt 210.0 lb

## 2018-11-30 DIAGNOSIS — R29898 Other symptoms and signs involving the musculoskeletal system: Secondary | ICD-10-CM | POA: Diagnosis not present
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
## 2018-12-04 ENCOUNTER — Other Ambulatory Visit: Payer: Self-pay | Admitting: Family Medicine

## 2018-12-04 DIAGNOSIS — R29898 Other symptoms and signs involving the musculoskeletal system: Secondary | ICD-10-CM

## 2018-12-14 ENCOUNTER — Ambulatory Visit: Payer: Medicare HMO | Admitting: Family Medicine

## 2018-12-18 ENCOUNTER — Ambulatory Visit: Payer: Medicare HMO | Admitting: Family Medicine

## 2019-01-01 ENCOUNTER — Encounter: Payer: Self-pay | Admitting: Family Medicine

## 2019-01-01 ENCOUNTER — Ambulatory Visit (INDEPENDENT_AMBULATORY_CARE_PROVIDER_SITE_OTHER): Payer: Medicare HMO | Admitting: Family Medicine

## 2019-01-01 VITALS — BP 140/68 | HR 82 | Temp 98.2°F | Resp 18 | Ht 64.0 in | Wt 209.0 lb

## 2019-01-01 DIAGNOSIS — L509 Urticaria, unspecified: Secondary | ICD-10-CM

## 2019-01-01 DIAGNOSIS — H6192 Disorder of left external ear, unspecified: Secondary | ICD-10-CM

## 2019-01-01 MED ORDER — LEVOCETIRIZINE DIHYDROCHLORIDE 5 MG PO TABS: 5.0000 mg | ORAL_TABLET | Freq: Every evening | ORAL | 0 refills | Status: DC

## 2019-01-01 MED ORDER — FAMOTIDINE 40 MG PO TABS: 40.0000 mg | ORAL_TABLET | Freq: Every day | ORAL | 2 refills | Status: DC
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
## 2019-01-02 LAB — COMPLETE METABOLIC PANEL WITH GFR
AG RATIO: 1.4 (calc) (ref 1.0–2.5)
ALT: 8 U/L (ref 6–29)
AST: 15 U/L (ref 10–35)
Albumin: 3.7 g/dL (ref 3.6–5.1)
Alkaline phosphatase (APISO): 95 U/L (ref 33–130)
BILIRUBIN TOTAL: 0.2 mg/dL (ref 0.2–1.2)
BUN: 10 mg/dL (ref 7–25)
CALCIUM: 9.2 mg/dL (ref 8.6–10.4)
CHLORIDE: 102 mmol/L (ref 98–110)
CO2: 26 mmol/L (ref 20–32)
Creat: 0.63 mg/dL (ref 0.50–0.99)
GFR, EST AFRICAN AMERICAN: 106 mL/min/{1.73_m2} (ref >=60)
GFR, EST NON AFRICAN AMERICAN: 92 mL/min/{1.73_m2} (ref >=60)
GLOBULIN: 2.7 g/dL (ref 1.9–3.7)
Glucose, Bld: 192 mg/dL — ABNORMAL HIGH (ref 65–99)
POTASSIUM: 4 mmol/L (ref 3.5–5.3)
Sodium: 137 mmol/L (ref 135–146)
TOTAL PROTEIN: 6.4 g/dL (ref 6.1–8.1)

## 2019-01-02 LAB — CBC WITH DIFFERENTIAL/PLATELET
ABSOLUTE MONOCYTES: 423 {cells}/uL (ref 200–950)
BASOS PCT: 0.6 %
Basophils Absolute: 44 cells/uL (ref 0–200)
EOS ABS: 168 {cells}/uL (ref 15–500)
Eosinophils Relative: 2.3 %
HEMATOCRIT: 32.7 % — AB (ref 35.0–45.0)
Hemoglobin: 10.2 g/dL — ABNORMAL LOW (ref 11.7–15.5)
LYMPHS ABS: 1650 {cells}/uL (ref 850–3900)
MCH: 23 pg — AB (ref 27.0–33.0)
MCHC: 31.2 g/dL — ABNORMAL LOW (ref 32.0–36.0)
MCV: 73.8 fL — AB (ref 80.0–100.0)
MPV: 11.4 fL (ref 7.5–12.5)
Monocytes Relative: 5.8 %
NEUTROS PCT: 68.7 %
Neutro Abs: 5015 cells/uL (ref 1500–7800)
PLATELETS: 320 10*3/uL (ref 140–400)
RBC: 4.43 10*6/uL (ref 3.80–5.10)
RDW: 15.2 % — AB (ref 11.0–15.0)
TOTAL LYMPHOCYTE: 22.6 %
WBC: 7.3 10*3/uL (ref 3.8–10.8)

## 2019-01-02 LAB — C-REACTIVE PROTEIN: CRP: 14.2 mg/L — ABNORMAL HIGH (ref <8.0)

## 2019-01-02 LAB — TSH: TSH: 5.61 mIU/L — ABNORMAL HIGH (ref 0.40–4.50)

## 2019-01-04 ENCOUNTER — Other Ambulatory Visit: Payer: Self-pay | Admitting: Family Medicine

## 2019-01-04 DIAGNOSIS — E038 Other specified hypothyroidism: Secondary | ICD-10-CM

## 2019-01-04 DIAGNOSIS — R739 Hyperglycemia, unspecified: Secondary | ICD-10-CM

## 2019-01-04 DIAGNOSIS — D500 Iron deficiency anemia secondary to blood loss (chronic): Secondary | ICD-10-CM

## 2019-01-04 DIAGNOSIS — E119 Type 2 diabetes mellitus without complications: Secondary | ICD-10-CM

## 2019-01-04 MED ORDER — FERROUS SULFATE 325 (65 FE) MG PO TABS: 325.0000 mg | ORAL_TABLET | Freq: Every day | ORAL | 3 refills | Status: DC

## 2019-01-04 MED ORDER — LEVOTHYROXINE SODIUM 125 MCG PO TABS: 125.0000 ug | ORAL_TABLET | Freq: Every day | ORAL | 3 refills | Status: DC

## 2019-01-16 ENCOUNTER — Other Ambulatory Visit: Payer: Self-pay | Admitting: Family Medicine

## 2019-01-16 ENCOUNTER — Ambulatory Visit (INDEPENDENT_AMBULATORY_CARE_PROVIDER_SITE_OTHER): Payer: Medicare HMO | Admitting: Family Medicine

## 2019-01-16 ENCOUNTER — Encounter: Payer: Self-pay | Admitting: Family Medicine

## 2019-01-16 VITALS — Resp 18 | Ht 64.0 in | Wt 205.0 lb

## 2019-01-16 DIAGNOSIS — E860 Dehydration: Secondary | ICD-10-CM | POA: Diagnosis not present

## 2019-01-16 DIAGNOSIS — R55 Syncope and collapse: Secondary | ICD-10-CM

## 2019-01-16 DIAGNOSIS — D649 Anemia, unspecified: Secondary | ICD-10-CM

## 2019-01-16 DIAGNOSIS — R7309 Other abnormal glucose: Secondary | ICD-10-CM | POA: Diagnosis not present

## 2019-01-16 DIAGNOSIS — I951 Orthostatic hypotension: Secondary | ICD-10-CM

## 2019-01-16 MED ORDER — FLUDROCORTISONE ACETATE 0.1 MG PO TABS: 0.1000 mg | ORAL_TABLET | Freq: Every day | ORAL | 0 refills | Status: DC
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
## 2019-01-17 LAB — HEMOGLOBIN A1C
Hgb A1c MFr Bld: 8.1 % of total Hgb — ABNORMAL HIGH (ref <5.7)
Mean Plasma Glucose: 186 (calc)
eAG (mmol/L): 10.3 (calc)

## 2019-01-17 LAB — BASIC METABOLIC PANEL WITH GFR
BUN: 22 mg/dL (ref 7–25)
CALCIUM: 9.9 mg/dL (ref 8.6–10.4)
CO2: 25 mmol/L (ref 20–32)
Chloride: 102 mmol/L (ref 98–110)
Creat: 0.76 mg/dL (ref 0.50–0.99)
GFR, Est African American: 93 mL/min/{1.73_m2} (ref >=60)
GFR, Est Non African American: 80 mL/min/{1.73_m2} (ref >=60)
Glucose, Bld: 121 mg/dL — ABNORMAL HIGH (ref 65–99)
Potassium: 4.3 mmol/L (ref 3.5–5.3)
Sodium: 138 mmol/L (ref 135–146)

## 2019-01-17 LAB — CBC WITH DIFFERENTIAL/PLATELET
Absolute Monocytes: 415 cells/uL (ref 200–950)
Basophils Absolute: 47 cells/uL (ref 0–200)
Basophils Relative: 0.7 %
Eosinophils Absolute: 241 cells/uL (ref 15–500)
Eosinophils Relative: 3.6 %
HCT: 28.7 % — ABNORMAL LOW (ref 35.0–45.0)
Hemoglobin: 9.1 g/dL — ABNORMAL LOW (ref 11.7–15.5)
Lymphs Abs: 1869 cells/uL (ref 850–3900)
MCH: 22.9 pg — ABNORMAL LOW (ref 27.0–33.0)
MCHC: 31.7 g/dL — AB (ref 32.0–36.0)
MCV: 72.1 fL — ABNORMAL LOW (ref 80.0–100.0)
MPV: 11.5 fL (ref 7.5–12.5)
Monocytes Relative: 6.2 %
Neutro Abs: 4127 cells/uL (ref 1500–7800)
Neutrophils Relative %: 61.6 %
Platelets: 305 10*3/uL (ref 140–400)
RBC: 3.98 10*6/uL (ref 3.80–5.10)
RDW: 15.3 % — ABNORMAL HIGH (ref 11.0–15.0)
TOTAL LYMPHOCYTE: 27.9 %
WBC: 6.7 10*3/uL (ref 3.8–10.8)

## 2019-01-22 ENCOUNTER — Telehealth: Payer: Self-pay | Admitting: Gastroenterology

## 2019-01-22 ENCOUNTER — Encounter: Payer: Self-pay | Admitting: Family Medicine

## 2019-01-22 ENCOUNTER — Ambulatory Visit (INDEPENDENT_AMBULATORY_CARE_PROVIDER_SITE_OTHER): Payer: Medicare HMO | Admitting: Family Medicine

## 2019-01-22 VITALS — BP 152/68 | HR 76 | Temp 98.5°F | Resp 20 | Ht 64.0 in | Wt 213.0 lb

## 2019-01-22 DIAGNOSIS — E119 Type 2 diabetes mellitus without complications: Secondary | ICD-10-CM | POA: Diagnosis not present

## 2019-01-22 DIAGNOSIS — D649 Anemia, unspecified: Secondary | ICD-10-CM

## 2019-01-22 DIAGNOSIS — I951 Orthostatic hypotension: Secondary | ICD-10-CM

## 2019-01-22 MED ORDER — PIOGLITAZONE HCL 30 MG PO TABS: 30.0000 mg | ORAL_TABLET | Freq: Every day | ORAL | 3 refills | Status: DC
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
## 2019-01-22 NOTE — Telephone Encounter (Signed)
The pt states she would like an appt to come in and discuss capsule endo again with Dr Loletha Carrow. Appt made for 2/25. The pt has been advised of the information and verbalized understanding.

## 2019-01-22 NOTE — Telephone Encounter (Signed)
Pt called in wanting to speak with the nurse with advice on getting a capsule endo

## 2019-01-25 ENCOUNTER — Other Ambulatory Visit: Payer: Self-pay | Admitting: Family Medicine

## 2019-01-31 ENCOUNTER — Other Ambulatory Visit: Payer: Self-pay | Admitting: Family Medicine

## 2019-01-31 MED ORDER — LEVOCETIRIZINE DIHYDROCHLORIDE 5 MG PO TABS: 5.0000 mg | ORAL_TABLET | Freq: Every day | ORAL | 3 refills | Status: DC

## 2019-02-01 ENCOUNTER — Encounter: Payer: Medicare HMO | Admitting: Diagnostic Neuroimaging

## 2019-02-06 ENCOUNTER — Encounter: Payer: Self-pay | Admitting: Gastroenterology

## 2019-02-06 ENCOUNTER — Ambulatory Visit: Payer: Medicare HMO | Admitting: Gastroenterology

## 2019-02-06 ENCOUNTER — Other Ambulatory Visit (INDEPENDENT_AMBULATORY_CARE_PROVIDER_SITE_OTHER): Payer: Medicare HMO

## 2019-02-06 VITALS — BP 130/58 | HR 80 | Ht 64.0 in | Wt 212.0 lb

## 2019-02-06 DIAGNOSIS — D500 Iron deficiency anemia secondary to blood loss (chronic): Secondary | ICD-10-CM | POA: Diagnosis not present

## 2019-02-06 LAB — IBC + FERRITIN
Ferritin: 20.5 ng/mL (ref 10.0–291.0)
Iron: 83 ug/dL (ref 42–145)
Saturation Ratios: 17.8 % — ABNORMAL LOW (ref 20.0–50.0)
TRANSFERRIN: 333 mg/dL (ref 212.0–360.0)

## 2019-02-06 NOTE — Patient Instructions (Addendum)
If you are age 70 or older, your body mass index should be between 23-30. Your Body mass index is 36.39 kg/m. If this is out of the aforementioned range listed, please consider follow up with your Primary Care Provider.  If you are age 27 or younger, your body mass index should be between 19-25. Your Body mass index is 36.39 kg/m. If this is out of the aformentioned range listed, please consider follow up with your Primary Care Provider.   The iron (feraheme) infusion has been scheduled for March 3rd at [facility]. Please arrive 15 minutes early. If you need to cancel or reschedule [phone]  Your provider has requested that you go to the basement level for lab work before leaving today. Press "B" on the elevator. The lab is located at the first door on the left as you exit the elevator.   It was a pleasure to see you today!  Dr. Loletha Carrow

## 2019-02-06 NOTE — Progress Notes (Signed)
[facility] Progress Note  Chief Complaint: Iron deficiency anemia  Subjective  History:  I last saw him a year ago for heme positive stool and iron deficiency anemia.  I had seen her in 2018 for the same thing.  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  Upper endoscopy was unrevealing.  The anemia persisted after that despite oral iron replacement, and a video capsule study was ordered February 2019.  It was initially scheduled, but the patient then canceled it due to cost. Pamela Cherry has been fairly well since I last saw her.  She had an episode of presyncope a few weeks ago and saw primary care.  She reports her blood pressure was low and they had to give her a liter of IV fluids. ROS: Cardiovascular:  no chest pain Respiratory: no dyspnea She does not see black or bloody stool.  She has continued iron tablets but does not feel they are working well. The patient's Past Medical, Family and Social History were reviewed and are on file in the EMR.  Objective:  Med list reviewed  Current Outpatient Medications:  .  aspirin EC 81 MG tablet, Take 81 mg by mouth daily., Disp: , Rfl:  .  calcium carbonate (CALCIUM 600) 600 MG TABS tablet, Take 600 mg by mouth daily. , Disp: , Rfl:  .  clorazepate (TRANXENE) 7.5 MG tablet, Take 1 tablet (7.5 mg total) by mouth at bedtime as needed for anxiety or sleep., Disp: 90 tablet, Rfl: 1 .  ferrous sulfate 325 (65 FE) MG tablet, Take 1 tablet (325 mg total) by mouth daily with breakfast., Disp: 90 tablet, Rfl: 3 .  fludrocortisone (FLORINEF) 0.1 MG tablet, Take 1 tablet (0.1 mg total) by mouth daily., Disp: 30 tablet, Rfl: 0 .  glipiZIDE (GLUCOTROL XL) 10 MG 24 hr tablet, Take 1 tablet (10 mg total) by mouth daily with breakfast., Disp: 90 tablet, Rfl: 1 .  levocetirizine (XYZAL) 5 MG tablet, Take 1 tablet (5 mg total) by mouth daily., Disp: 90 tablet, Rfl: 3 .  levothyroxine (SYNTHROID, LEVOTHROID) 125 MCG tablet, Take 1 tablet (125 mcg  total) by mouth daily., Disp: 90 tablet, Rfl: 3 .  metFORMIN (GLUCOPHAGE) 1000 MG tablet, Take 1 tablet (1,000 mg total) by mouth 2 (two) times daily with a meal., Disp: 180 tablet, Rfl: 3 .  Multiple Vitamin (MULTI-VITAMINS) TABS, Take 1 tablet by mouth daily. , Disp: , Rfl:  .  pioglitazone (ACTOS) 30 MG tablet, Take 1 tablet (30 mg total) by mouth daily., Disp: 90 tablet, Rfl: 3 .  pravastatin (PRAVACHOL) 40 MG tablet, Take 1 tablet (40 mg total) by mouth daily., Disp: 90 tablet, Rfl: 3   Vital signs in last 24 hrs: Vitals:   02/06/19 1334  BP: (!) 130/58  Pulse: 80    Physical Exam   HEENT: sclera anicteric, oral mucosa moist without lesions  Neck: supple, no thyromegaly, JVD or lymphadenopathy  Cardiac: RRR without murmurs, S1S2 heard, no peripheral edema  Pulm: clear to auscultation bilaterally, normal RR and effort noted  Abdomen: soft, no tenderness, with active bowel sounds. No guarding or palpable hepatosplenomegaly.  Skin; warm and dry, no jaundice or rash.  Mild pallor  Recent Labs:  CBC Latest Ref Rng & Units 01/16/2019 01/01/2019 04/11/2018  WBC 3.8 - 10.8 Thousand/uL 6.7 7.3 6.5  Hemoglobin 11.7 - 15.5 g/dL 9.1(L) 10.2(L) 9.0(L)  Hematocrit 35.0 - 45.0 % 28.7(L) 32.7(L) 30.9(L)  Platelets 140 - 400 Thousand/uL 305 320  294   CMP Latest Ref Rng & Units 01/16/2019 01/01/2019 04/11/2018  Glucose 65 - 99 mg/dL 121(H) 192(H) 260(H)  BUN 7 - 25 mg/dL 22 10 9   Creatinine 0.50 - 0.99 mg/dL 0.76 0.63 0.71  Sodium 135 - 146 mmol/L 138 137 139  Potassium 3.5 - 5.3 mmol/L 4.3 4.0 3.8  Chloride 98 - 110 mmol/L 102 102 103  CO2 20 - 32 mmol/L 25 26 26   Calcium 8.6 - 10.4 mg/dL 9.9 9.2 8.8  Total Protein 6.1 - 8.1 g/dL - 6.4 6.3  Total Bilirubin 0.2 - 1.2 mg/dL - 0.2 0.3  Alkaline Phos 38 - 126 U/L - - -  AST 10 - 35 U/L - 15 19  ALT 6 - 29 U/L - 8 8   Last iron studies were in December 2018   @ASSESSMENTPLANBEGIN @ Assessment: Encounter Diagnosis  Name Primary?  .  Iron deficiency anemia due to chronic blood loss Yes   This is presumed to be from small bowel blood loss, but she could have poor iron absorption.  This may be why her iron tablets are not working well.   Plan: Iron studies today Arrange a dose of Feraheme 510 mg IV once Small bowel video capsule study at our office.   Total time 25 minutes, over half spent face-to-face with patient in counseling and coordination of care.   Nelida Meuse III

## 2019-02-08 DIAGNOSIS — H608X2 Other otitis externa, left ear: Secondary | ICD-10-CM | POA: Diagnosis not present

## 2019-02-12 ENCOUNTER — Other Ambulatory Visit: Payer: Self-pay

## 2019-02-12 ENCOUNTER — Telehealth: Payer: Self-pay | Admitting: Gastroenterology

## 2019-02-12 DIAGNOSIS — D509 Iron deficiency anemia, unspecified: Secondary | ICD-10-CM

## 2019-02-12 NOTE — Telephone Encounter (Signed)
Please reschedule the capsule for pt.

## 2019-02-12 NOTE — Telephone Encounter (Signed)
Pt would like to r/s capsule, pls call her.

## 2019-02-12 NOTE — Telephone Encounter (Signed)
Pt r/s to 3/20 at 8:00am

## 2019-02-13 ENCOUNTER — Ambulatory Visit (HOSPITAL_COMMUNITY)
Admission: RE | Admit: 2019-02-13 | Discharge: 2019-02-13 | Disposition: A | Discharge status: Home / Self Care | Payer: Medicare HMO | Source: Ambulatory Visit | Attending: Gastroenterology | Admitting: Gastroenterology

## 2019-02-13 ENCOUNTER — Other Ambulatory Visit: Payer: Self-pay

## 2019-02-13 DIAGNOSIS — D509 Iron deficiency anemia, unspecified: Secondary | ICD-10-CM | POA: Diagnosis not present

## 2019-02-13 MED ORDER — SODIUM CHLORIDE 0.9 % IV SOLN
510.0000 mg | Freq: Once | INTRAVENOUS | Status: AC
  Administered 2019-02-13: 510 mg via INTRAVENOUS
  Filled 2019-02-13: qty 17

## 2019-02-13 NOTE — Discharge Instructions (Signed)

## 2019-02-16 NOTE — Telephone Encounter (Signed)
-----   Message from Darden Dates sent at 02/16/2019  1:12 PM EST ----- Vivien Rota, Capsule Endoscopy is pending denial.  They said reason is the pt has not had EGD/Colon in the past 12 months.  You will need to get with Dr. Loletha Carrow and see when's a good time to to a Peer to Peer. You need to call [phone] Case# [id number] I'm glad she rescheduled.  Hopefully that will give [place] enough time to get it approved. Thanks girl, Amy

## 2019-02-16 NOTE — Telephone Encounter (Signed)
Please see note from [facility]

## 2019-02-19 ENCOUNTER — Telehealth: Payer: Self-pay

## 2019-02-19 NOTE — Telephone Encounter (Signed)
Spoke with pt and she is aware.

## 2019-02-19 NOTE — Telephone Encounter (Signed)
Pt states that since she received her Feraheme infusion on 02/13/19 she has been feeling different. States that yesterday and today she feels lightheaded and dizzy when she stands up, feels like she may pass out. Pt thinks this is related to the [facility]. No shortness of breath per patient. Please advise.

## 2019-02-19 NOTE — Telephone Encounter (Signed)
I am concerned to hear that, but it would be unusual from [facility].  However, she had low blood pressure at recent PCP office visits.  They attributed part of it to anemia, but I am not certain the anemia is the main cause.  She needs to see Dr. Livia Snellen tomorrow for blood pressure check and to have hemoglobin drawn.  Stay at home until then, do not drive a car (someone must drive her to PCP).  If feels she might pass out, call ambulance to go to emergency department.

## 2019-02-20 ENCOUNTER — Encounter: Payer: Self-pay | Admitting: Family Medicine

## 2019-02-20 ENCOUNTER — Ambulatory Visit (INDEPENDENT_AMBULATORY_CARE_PROVIDER_SITE_OTHER): Payer: Medicare HMO | Admitting: Family Medicine

## 2019-02-20 ENCOUNTER — Encounter (HOSPITAL_COMMUNITY): Payer: Medicare HMO

## 2019-02-20 VITALS — BP 110/76 | HR 86 | Temp 98.0°F | Resp 16 | Ht 64.0 in | Wt 210.0 lb

## 2019-02-20 DIAGNOSIS — G909 Disorder of the autonomic nervous system, unspecified: Secondary | ICD-10-CM | POA: Diagnosis not present

## 2019-02-20 MED ORDER — MIDODRINE HCL 5 MG PO TABS: 5.0000 mg | ORAL_TABLET | Freq: Three times a day (TID) | ORAL | 3 refills | Status: DC

## 2019-02-20 NOTE — Telephone Encounter (Signed)
Vaughan Basta   Medicare declined to approve the upcoming video capsule study.  Yesterday I spoke with Dr. Armanda Heritage, a physician reviewer with her insurance.  She told me that Medicare rules for [facility] [phone], to be exact) require that the patient have an EGD and colonoscopy within 12 months prior to video capsule study when being done for iron deficiency anemia.  I explained this case in detail to Dr. Armanda Heritage, in particular how this appears to be the same issue being addressed ever since the EGD and colonoscopy in 2018, but this is reportedly nonnegotiable Medicare policy.  Therefore, please cancel the upcoming video capsule study.  If Mieko would like to proceed with upper endoscopy and colonoscopy to rule out any source of GI blood loss in those areas, and then proceed with video capsule study to follow, I am willing to do so after her current episodes of being lightheaded with low blood pressure are worked up and treated. I will copy this to her primary care provider as well.  Dr. Livia Snellen,  Please see above I believe it is seeing you today for episodes of presyncope and suspected low blood pressure. She got a dose of IV iron on March 3.  Please check her hemoglobin hematocrit when you see her.  That said, it is not clear to me that the anemia fully accounts for low blood pressure and presyncope.   Wilfrid Lund, MD    [facility]

## 2019-02-20 NOTE — Telephone Encounter (Signed)
Pt aware, capsule endo cancelled. Pt knows to call the office back if she is interested in pursuing additional tests once her BP issues have been addressed.
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
## 2019-02-21 LAB — CBC WITH DIFFERENTIAL/PLATELET
ABSOLUTE MONOCYTES: 481 {cells}/uL (ref 200–950)
Basophils Absolute: 33 cells/uL (ref 0–200)
Basophils Relative: 0.5 %
Eosinophils Absolute: 143 cells/uL (ref 15–500)
Eosinophils Relative: 2.2 %
HCT: 33.7 % — ABNORMAL LOW (ref 35.0–45.0)
Hemoglobin: 10.5 g/dL — ABNORMAL LOW (ref 11.7–15.5)
Lymphs Abs: 1866 cells/uL (ref 850–3900)
MCH: 23.9 pg — ABNORMAL LOW (ref 27.0–33.0)
MCHC: 31.2 g/dL — ABNORMAL LOW (ref 32.0–36.0)
MCV: 76.8 fL — AB (ref 80.0–100.0)
MPV: 11.7 fL (ref 7.5–12.5)
Monocytes Relative: 7.4 %
Neutro Abs: 3978 cells/uL (ref 1500–7800)
Neutrophils Relative %: 61.2 %
Platelets: 283 10*3/uL (ref 140–400)
RBC: 4.39 10*6/uL (ref 3.80–5.10)
RDW: 18.5 % — ABNORMAL HIGH (ref 11.0–15.0)
Total Lymphocyte: 28.7 %
WBC: 6.5 10*3/uL (ref 3.8–10.8)

## 2019-02-21 LAB — COMPLETE METABOLIC PANEL WITH GFR
AG Ratio: 1.5 (calc) (ref 1.0–2.5)
ALT: 11 U/L (ref 6–29)
AST: 20 U/L (ref 10–35)
Albumin: 3.8 g/dL (ref 3.6–5.1)
Alkaline phosphatase (APISO): 75 U/L (ref 37–153)
BUN: 13 mg/dL (ref 7–25)
CHLORIDE: 104 mmol/L (ref 98–110)
CO2: 25 mmol/L (ref 20–32)
Calcium: 9.8 mg/dL (ref 8.6–10.4)
Creat: 0.64 mg/dL (ref 0.50–0.99)
GFR, Est African American: 106 mL/min/{1.73_m2} (ref >=60)
GFR, Est Non African American: 91 mL/min/{1.73_m2} (ref >=60)
Globulin: 2.5 g/dL (calc) (ref 1.9–3.7)
Glucose, Bld: 61 mg/dL — ABNORMAL LOW (ref 65–99)
Potassium: 3.9 mmol/L (ref 3.5–5.3)
Sodium: 139 mmol/L (ref 135–146)
Total Bilirubin: 0.3 mg/dL (ref 0.2–1.2)
Total Protein: 6.3 g/dL (ref 6.1–8.1)

## 2019-02-23 ENCOUNTER — Telehealth: Payer: Self-pay | Admitting: Family Medicine

## 2019-02-23 MED ORDER — FLUDROCORTISONE ACETATE 0.1 MG PO TABS: 0.1000 mg | ORAL_TABLET | Freq: Two times a day (BID) | ORAL | 1 refills | Status: DC

## 2019-02-23 NOTE — Telephone Encounter (Signed)
Pt called and states that the Midodrine is 135$ and she can not afford that. Per Dr. Dennard Schaumann increase Florinef to bid. Pt aware and med sent to pharm.

## 2019-03-08 ENCOUNTER — Encounter: Payer: Medicare HMO | Admitting: Diagnostic Neuroimaging

## 2019-03-16 ENCOUNTER — Telehealth: Payer: Self-pay | Admitting: Family Medicine

## 2019-03-16 NOTE — Telephone Encounter (Signed)
Patient is calling to ask questions about taking her gabapentin, if she can start taking this again because it helps her nerves  [phone]

## 2019-03-19 NOTE — Telephone Encounter (Signed)
Pt wanted to know if she could restart this again since her "blood" levels have came up?

## 2019-03-20 NOTE — Telephone Encounter (Signed)
Pt aware of recommendations

## 2019-03-20 NOTE — Telephone Encounter (Signed)
I am ok with resuming gabapentin if her dizziness is better.  Gabapentin 300 mg potid.

## 2019-04-04 ENCOUNTER — Other Ambulatory Visit: Payer: Self-pay | Admitting: Family Medicine

## 2019-06-07 ENCOUNTER — Ambulatory Visit (INDEPENDENT_AMBULATORY_CARE_PROVIDER_SITE_OTHER): Payer: Medicare HMO | Admitting: Family Medicine

## 2019-06-07 ENCOUNTER — Ambulatory Visit
Admission: RE | Admit: 2019-06-07 | Discharge: 2019-06-07 | Disposition: A | Discharge status: Home / Self Care | Payer: Medicare HMO | Source: Ambulatory Visit | Attending: Family Medicine | Admitting: Family Medicine

## 2019-06-07 ENCOUNTER — Other Ambulatory Visit: Payer: Self-pay

## 2019-06-07 ENCOUNTER — Encounter: Payer: Self-pay | Admitting: Family Medicine

## 2019-06-07 VITALS — BP 158/84 | HR 76 | Temp 98.1°F | Resp 14 | Ht 64.0 in | Wt 219.0 lb

## 2019-06-07 DIAGNOSIS — Z89431 Acquired absence of right foot: Secondary | ICD-10-CM | POA: Diagnosis not present

## 2019-06-07 DIAGNOSIS — R05 Cough: Secondary | ICD-10-CM | POA: Diagnosis not present

## 2019-06-07 DIAGNOSIS — E1169 Type 2 diabetes mellitus with other specified complication: Secondary | ICD-10-CM

## 2019-06-07 DIAGNOSIS — R0989 Other specified symptoms and signs involving the circulatory and respiratory systems: Secondary | ICD-10-CM

## 2019-06-07 DIAGNOSIS — D508 Other iron deficiency anemias: Secondary | ICD-10-CM | POA: Diagnosis not present
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
## 2019-06-11 LAB — COMPLETE METABOLIC PANEL WITH GFR
AG Ratio: 1.4 (calc) (ref 1.0–2.5)
ALT: 9 U/L (ref 6–29)
AST: 19 U/L (ref 10–35)
Albumin: 3.6 g/dL (ref 3.6–5.1)
Alkaline phosphatase (APISO): 77 U/L (ref 37–153)
BUN: 9 mg/dL (ref 7–25)
CO2: 28 mmol/L (ref 20–32)
Calcium: 9 mg/dL (ref 8.6–10.4)
Chloride: 105 mmol/L (ref 98–110)
Creat: 0.54 mg/dL (ref 0.50–0.99)
GFR, Est African American: 112 mL/min/{1.73_m2} (ref >=60)
GFR, Est Non African American: 96 mL/min/{1.73_m2} (ref >=60)
Globulin: 2.6 g/dL (calc) (ref 1.9–3.7)
Glucose, Bld: 96 mg/dL (ref 65–99)
Potassium: 3.7 mmol/L (ref 3.5–5.3)
Sodium: 140 mmol/L (ref 135–146)
Total Bilirubin: 0.3 mg/dL (ref 0.2–1.2)
Total Protein: 6.2 g/dL (ref 6.1–8.1)

## 2019-06-11 LAB — CBC WITH DIFFERENTIAL/PLATELET
Absolute Monocytes: 447 cells/uL (ref 200–950)
Basophils Absolute: 29 cells/uL (ref 0–200)
Basophils Relative: 0.5 %
Eosinophils Absolute: 157 cells/uL (ref 15–500)
Eosinophils Relative: 2.7 %
HCT: 32 % — ABNORMAL LOW (ref 35.0–45.0)
Hemoglobin: 10 g/dL — ABNORMAL LOW (ref 11.7–15.5)
Lymphs Abs: 1473 cells/uL (ref 850–3900)
MCH: 24.4 pg — ABNORMAL LOW (ref 27.0–33.0)
MCHC: 31.3 g/dL — ABNORMAL LOW (ref 32.0–36.0)
MCV: 78.2 fL — ABNORMAL LOW (ref 80.0–100.0)
MPV: 11.9 fL (ref 7.5–12.5)
Monocytes Relative: 7.7 %
Neutro Abs: 3695 cells/uL (ref 1500–7800)
Neutrophils Relative %: 63.7 %
Platelets: 204 10*3/uL (ref 140–400)
RBC: 4.09 10*6/uL (ref 3.80–5.10)
RDW: 15.6 % — ABNORMAL HIGH (ref 11.0–15.0)
Total Lymphocyte: 25.4 %
WBC: 5.8 10*3/uL (ref 3.8–10.8)

## 2019-06-11 LAB — LIPID PANEL
Cholesterol: 162 mg/dL (ref <200)
HDL: 36 mg/dL — ABNORMAL LOW (ref >=50)
LDL Cholesterol (Calc): 100 mg/dL (calc) — ABNORMAL HIGH
Non-HDL Cholesterol (Calc): 126 mg/dL (calc) (ref <130)
Total CHOL/HDL Ratio: 4.5 (calc) (ref <5.0)
Triglycerides: 165 mg/dL — ABNORMAL HIGH (ref <150)

## 2019-06-11 LAB — HEMOGLOBIN A1C
Hgb A1c MFr Bld: 6.5 % of total Hgb — ABNORMAL HIGH (ref <5.7)
Mean Plasma Glucose: 140 (calc)
eAG (mmol/L): 7.7 (calc)

## 2019-06-11 LAB — TEST AUTHORIZATION 2

## 2019-06-11 LAB — IRON: Iron: 35 ug/dL — ABNORMAL LOW (ref 45–160)

## 2019-06-12 ENCOUNTER — Other Ambulatory Visit: Payer: Self-pay | Admitting: Family Medicine

## 2019-06-26 DIAGNOSIS — K006 Disturbances in tooth eruption: Secondary | ICD-10-CM | POA: Diagnosis not present

## 2019-07-04 ENCOUNTER — Other Ambulatory Visit: Payer: Medicare HMO

## 2019-07-04 ENCOUNTER — Other Ambulatory Visit: Payer: Self-pay

## 2019-07-04 DIAGNOSIS — D649 Anemia, unspecified: Secondary | ICD-10-CM | POA: Diagnosis not present

## 2019-07-04 LAB — CBC WITH DIFFERENTIAL/PLATELET
Absolute Monocytes: 484 cells/uL (ref 200–950)
Basophils Absolute: 41 cells/uL (ref 0–200)
Basophils Relative: 0.7 %
Eosinophils Absolute: 212 cells/uL (ref 15–500)
Eosinophils Relative: 3.6 %
HCT: 35.9 % (ref 35.0–45.0)
Hemoglobin: 11.3 g/dL — ABNORMAL LOW (ref 11.7–15.5)
Lymphs Abs: 1634 cells/uL (ref 850–3900)
MCH: 25.1 pg — ABNORMAL LOW (ref 27.0–33.0)
MCHC: 31.5 g/dL — ABNORMAL LOW (ref 32.0–36.0)
MCV: 79.6 fL — ABNORMAL LOW (ref 80.0–100.0)
MPV: 11.6 fL (ref 7.5–12.5)
Monocytes Relative: 8.2 %
Neutro Abs: 3528 cells/uL (ref 1500–7800)
Neutrophils Relative %: 59.8 %
Platelets: 192 10*3/uL (ref 140–400)
RBC: 4.51 10*6/uL (ref 3.80–5.10)
RDW: 16.3 % — ABNORMAL HIGH (ref 11.0–15.0)
Total Lymphocyte: 27.7 %
WBC: 5.9 10*3/uL (ref 3.8–10.8)

## 2019-08-08 ENCOUNTER — Other Ambulatory Visit: Payer: Self-pay | Admitting: Family Medicine

## 2019-08-08 MED ORDER — FAMOTIDINE 40 MG PO TABS: 40.0000 mg | ORAL_TABLET | Freq: Every day | ORAL | 3 refills | Status: DC

## 2019-09-05 ENCOUNTER — Ambulatory Visit: Payer: Medicare HMO

## 2019-09-07 ENCOUNTER — Ambulatory Visit: Payer: Medicare HMO

## 2019-09-11 ENCOUNTER — Other Ambulatory Visit: Payer: Self-pay

## 2019-09-11 ENCOUNTER — Ambulatory Visit (INDEPENDENT_AMBULATORY_CARE_PROVIDER_SITE_OTHER): Payer: Medicare HMO

## 2019-09-11 DIAGNOSIS — Z23 Encounter for immunization: Secondary | ICD-10-CM | POA: Diagnosis not present

## 2019-09-24 ENCOUNTER — Other Ambulatory Visit: Payer: Self-pay

## 2019-09-24 MED ORDER — METFORMIN HCL 1000 MG PO TABS: 1000.0000 mg | ORAL_TABLET | Freq: Two times a day (BID) | ORAL | 0 refills | Status: DC

## 2019-10-05 ENCOUNTER — Other Ambulatory Visit: Payer: Self-pay

## 2019-10-05 ENCOUNTER — Other Ambulatory Visit: Payer: Medicare HMO

## 2019-10-05 DIAGNOSIS — E119 Type 2 diabetes mellitus without complications: Secondary | ICD-10-CM | POA: Diagnosis not present

## 2019-10-05 DIAGNOSIS — D649 Anemia, unspecified: Secondary | ICD-10-CM | POA: Diagnosis not present

## 2019-10-06 LAB — CBC WITH DIFFERENTIAL/PLATELET
Absolute Monocytes: 412 cells/uL (ref 200–950)
Basophils Absolute: 29 cells/uL (ref 0–200)
Basophils Relative: 0.5 %
Eosinophils Absolute: 151 cells/uL (ref 15–500)
Eosinophils Relative: 2.6 %
HCT: 35.5 % (ref 35.0–45.0)
Hemoglobin: 11.1 g/dL — ABNORMAL LOW (ref 11.7–15.5)
Lymphs Abs: 1450 cells/uL (ref 850–3900)
MCH: 24.8 pg — ABNORMAL LOW (ref 27.0–33.0)
MCHC: 31.3 g/dL — ABNORMAL LOW (ref 32.0–36.0)
MCV: 79.4 fL — ABNORMAL LOW (ref 80.0–100.0)
MPV: 11.3 fL (ref 7.5–12.5)
Monocytes Relative: 7.1 %
Neutro Abs: 3758 cells/uL (ref 1500–7800)
Neutrophils Relative %: 64.8 %
Platelets: 234 10*3/uL (ref 140–400)
RBC: 4.47 10*6/uL (ref 3.80–5.10)
RDW: 14.6 % (ref 11.0–15.0)
Total Lymphocyte: 25 %
WBC: 5.8 10*3/uL (ref 3.8–10.8)

## 2019-10-06 LAB — LIPID PANEL
Cholesterol: 165 mg/dL (ref <200)
HDL: 31 mg/dL — ABNORMAL LOW (ref >=50)
LDL Cholesterol (Calc): 111 mg/dL (calc) — ABNORMAL HIGH
Non-HDL Cholesterol (Calc): 134 mg/dL (calc) — ABNORMAL HIGH (ref <130)
Total CHOL/HDL Ratio: 5.3 (calc) — ABNORMAL HIGH (ref <5.0)
Triglycerides: 123 mg/dL (ref <150)

## 2019-10-06 LAB — HEMOGLOBIN A1C
Hgb A1c MFr Bld: 6.7 % of total Hgb — ABNORMAL HIGH (ref <5.7)
Mean Plasma Glucose: 146 (calc)
eAG (mmol/L): 8.1 (calc)

## 2019-10-06 LAB — COMPREHENSIVE METABOLIC PANEL
AG Ratio: 1.4 (calc) (ref 1.0–2.5)
ALT: 9 U/L (ref 6–29)
AST: 21 U/L (ref 10–35)
Albumin: 3.6 g/dL (ref 3.6–5.1)
Alkaline phosphatase (APISO): 86 U/L (ref 37–153)
BUN: 15 mg/dL (ref 7–25)
CO2: 23 mmol/L (ref 20–32)
Calcium: 9.2 mg/dL (ref 8.6–10.4)
Chloride: 107 mmol/L (ref 98–110)
Creat: 0.71 mg/dL (ref 0.50–0.99)
Globulin: 2.6 g/dL (calc) (ref 1.9–3.7)
Glucose, Bld: 169 mg/dL — ABNORMAL HIGH (ref 65–99)
Potassium: 3.9 mmol/L (ref 3.5–5.3)
Sodium: 138 mmol/L (ref 135–146)
Total Bilirubin: 0.3 mg/dL (ref 0.2–1.2)
Total Protein: 6.2 g/dL (ref 6.1–8.1)

## 2019-10-10 ENCOUNTER — Other Ambulatory Visit: Payer: Self-pay | Admitting: Family Medicine

## 2019-10-10 MED ORDER — ROSUVASTATIN CALCIUM 20 MG PO TABS: 20.0000 mg | ORAL_TABLET | Freq: Every day | ORAL | 3 refills | Status: DC

## 2019-10-10 NOTE — Progress Notes (Signed)
[address]

## 2019-12-27 ENCOUNTER — Ambulatory Visit: Payer: Medicare HMO | Admitting: Family Medicine

## 2020-03-28 ENCOUNTER — Other Ambulatory Visit: Payer: Self-pay | Admitting: Family Medicine

## 2020-03-28 DIAGNOSIS — Z1231 Encounter for screening mammogram for malignant neoplasm of breast: Secondary | ICD-10-CM

## 2020-04-22 ENCOUNTER — Ambulatory Visit: Payer: Medicare HMO | Admitting: Family Medicine

## 2020-04-30 DIAGNOSIS — H35033 Hypertensive retinopathy, bilateral: Secondary | ICD-10-CM | POA: Diagnosis not present

## 2020-04-30 DIAGNOSIS — E119 Type 2 diabetes mellitus without complications: Secondary | ICD-10-CM | POA: Diagnosis not present

## 2020-04-30 DIAGNOSIS — H2513 Age-related nuclear cataract, bilateral: Secondary | ICD-10-CM | POA: Diagnosis not present

## 2020-05-07 ENCOUNTER — Ambulatory Visit
Admission: RE | Admit: 2020-05-07 | Discharge: 2020-05-07 | Disposition: A | Discharge status: Home / Self Care | Payer: Medicare HMO | Source: Ambulatory Visit | Attending: Family Medicine | Admitting: Family Medicine

## 2020-05-07 ENCOUNTER — Other Ambulatory Visit: Payer: Self-pay

## 2020-05-07 DIAGNOSIS — Z1231 Encounter for screening mammogram for malignant neoplasm of breast: Secondary | ICD-10-CM | POA: Diagnosis not present

## 2020-10-02 ENCOUNTER — Ambulatory Visit: Payer: Medicare HMO

## 2020-10-30 DIAGNOSIS — I10 Essential (primary) hypertension: Secondary | ICD-10-CM | POA: Diagnosis not present

## 2020-10-30 DIAGNOSIS — E782 Mixed hyperlipidemia: Secondary | ICD-10-CM | POA: Diagnosis not present

## 2020-10-30 DIAGNOSIS — G8929 Other chronic pain: Secondary | ICD-10-CM | POA: Diagnosis not present

## 2020-10-30 DIAGNOSIS — E039 Hypothyroidism, unspecified: Secondary | ICD-10-CM | POA: Diagnosis not present

## 2020-10-30 DIAGNOSIS — E119 Type 2 diabetes mellitus without complications: Secondary | ICD-10-CM | POA: Diagnosis not present

## 2020-10-30 DIAGNOSIS — D649 Anemia, unspecified: Secondary | ICD-10-CM | POA: Diagnosis not present

## 2020-10-30 DIAGNOSIS — M25511 Pain in right shoulder: Secondary | ICD-10-CM | POA: Diagnosis not present

## 2020-10-30 DIAGNOSIS — F5101 Primary insomnia: Secondary | ICD-10-CM | POA: Diagnosis not present

## 2020-11-10 DIAGNOSIS — G8929 Other chronic pain: Secondary | ICD-10-CM | POA: Diagnosis not present

## 2020-11-10 DIAGNOSIS — Z89431 Acquired absence of right foot: Secondary | ICD-10-CM | POA: Diagnosis not present

## 2020-11-10 DIAGNOSIS — M25511 Pain in right shoulder: Secondary | ICD-10-CM | POA: Diagnosis not present

## 2020-12-15 DIAGNOSIS — R200 Anesthesia of skin: Secondary | ICD-10-CM | POA: Diagnosis not present

## 2020-12-15 DIAGNOSIS — M5412 Radiculopathy, cervical region: Secondary | ICD-10-CM | POA: Diagnosis not present

## 2020-12-17 DIAGNOSIS — M5412 Radiculopathy, cervical region: Secondary | ICD-10-CM | POA: Diagnosis not present

## 2020-12-17 DIAGNOSIS — M6281 Muscle weakness (generalized): Secondary | ICD-10-CM | POA: Diagnosis not present

## 2020-12-17 DIAGNOSIS — M2560 Stiffness of unspecified joint, not elsewhere classified: Secondary | ICD-10-CM | POA: Diagnosis not present

## 2020-12-25 DIAGNOSIS — M2560 Stiffness of unspecified joint, not elsewhere classified: Secondary | ICD-10-CM | POA: Diagnosis not present

## 2020-12-25 DIAGNOSIS — M6281 Muscle weakness (generalized): Secondary | ICD-10-CM | POA: Diagnosis not present

## 2020-12-25 DIAGNOSIS — M5412 Radiculopathy, cervical region: Secondary | ICD-10-CM | POA: Diagnosis not present

## 2021-01-21 DIAGNOSIS — M6281 Muscle weakness (generalized): Secondary | ICD-10-CM | POA: Diagnosis not present

## 2021-01-21 DIAGNOSIS — M2560 Stiffness of unspecified joint, not elsewhere classified: Secondary | ICD-10-CM | POA: Diagnosis not present

## 2021-01-21 DIAGNOSIS — M5412 Radiculopathy, cervical region: Secondary | ICD-10-CM | POA: Diagnosis not present

## 2021-01-30 DIAGNOSIS — M6281 Muscle weakness (generalized): Secondary | ICD-10-CM | POA: Diagnosis not present

## 2021-01-30 DIAGNOSIS — M5412 Radiculopathy, cervical region: Secondary | ICD-10-CM | POA: Diagnosis not present

## 2021-01-30 DIAGNOSIS — M2560 Stiffness of unspecified joint, not elsewhere classified: Secondary | ICD-10-CM | POA: Diagnosis not present

## 2021-02-06 DIAGNOSIS — M5001 Cervical disc disorder with myelopathy,  high cervical region: Secondary | ICD-10-CM | POA: Diagnosis not present

## 2021-02-06 DIAGNOSIS — M4802 Spinal stenosis, cervical region: Secondary | ICD-10-CM | POA: Diagnosis not present

## 2021-02-06 DIAGNOSIS — M5412 Radiculopathy, cervical region: Secondary | ICD-10-CM | POA: Diagnosis not present

## 2021-02-12 DIAGNOSIS — M5412 Radiculopathy, cervical region: Secondary | ICD-10-CM | POA: Diagnosis not present

## 2021-02-12 DIAGNOSIS — M47812 Spondylosis without myelopathy or radiculopathy, cervical region: Secondary | ICD-10-CM | POA: Diagnosis not present

## 2021-02-25 DIAGNOSIS — M5412 Radiculopathy, cervical region: Secondary | ICD-10-CM | POA: Diagnosis not present

## 2021-02-26 ENCOUNTER — Inpatient Hospital Stay (HOSPITAL_COMMUNITY)
Admission: EM | Admit: 2021-02-26 | Discharge: 2021-03-04 | DRG: 481 | Disposition: A | Discharge status: Home Health | Payer: Medicare HMO | Attending: Internal Medicine | Admitting: Internal Medicine

## 2021-02-26 ENCOUNTER — Emergency Department (HOSPITAL_COMMUNITY): Payer: Medicare HMO

## 2021-02-26 ENCOUNTER — Inpatient Hospital Stay (HOSPITAL_COMMUNITY): Payer: Medicare HMO | Admitting: Certified Registered Nurse Anesthetist

## 2021-02-26 ENCOUNTER — Inpatient Hospital Stay (HOSPITAL_COMMUNITY): Payer: Medicare HMO

## 2021-02-26 ENCOUNTER — Encounter (HOSPITAL_COMMUNITY): Payer: Self-pay | Admitting: Emergency Medicine

## 2021-02-26 ENCOUNTER — Encounter (HOSPITAL_COMMUNITY)
Admission: EM | Disposition: A | Discharge status: Home Health | Payer: Self-pay | Source: Home / Self Care | Attending: Internal Medicine

## 2021-02-26 DIAGNOSIS — Z8249 Family history of ischemic heart disease and other diseases of the circulatory system: Secondary | ICD-10-CM | POA: Diagnosis not present

## 2021-02-26 DIAGNOSIS — Z7984 Long term (current) use of oral hypoglycemic drugs: Secondary | ICD-10-CM | POA: Diagnosis not present

## 2021-02-26 DIAGNOSIS — Z23 Encounter for immunization: Secondary | ICD-10-CM | POA: Diagnosis not present

## 2021-02-26 DIAGNOSIS — D62 Acute posthemorrhagic anemia: Secondary | ICD-10-CM | POA: Diagnosis not present

## 2021-02-26 DIAGNOSIS — Z91048 Other nonmedicinal substance allergy status: Secondary | ICD-10-CM

## 2021-02-26 DIAGNOSIS — F17210 Nicotine dependence, cigarettes, uncomplicated: Secondary | ICD-10-CM | POA: Diagnosis present

## 2021-02-26 DIAGNOSIS — Z800 Family history of malignant neoplasm of digestive organs: Secondary | ICD-10-CM

## 2021-02-26 DIAGNOSIS — F419 Anxiety disorder, unspecified: Secondary | ICD-10-CM | POA: Diagnosis present

## 2021-02-26 DIAGNOSIS — S98011A Complete traumatic amputation of right foot at ankle level, initial encounter: Secondary | ICD-10-CM | POA: Diagnosis not present

## 2021-02-26 DIAGNOSIS — Z01818 Encounter for other preprocedural examination: Secondary | ICD-10-CM

## 2021-02-26 DIAGNOSIS — M19071 Primary osteoarthritis, right ankle and foot: Secondary | ICD-10-CM | POA: Diagnosis present

## 2021-02-26 DIAGNOSIS — Z833 Family history of diabetes mellitus: Secondary | ICD-10-CM

## 2021-02-26 DIAGNOSIS — M25552 Pain in left hip: Secondary | ICD-10-CM | POA: Diagnosis not present

## 2021-02-26 DIAGNOSIS — W010XXA Fall on same level from slipping, tripping and stumbling without subsequent striking against object, initial encounter: Secondary | ICD-10-CM | POA: Diagnosis present

## 2021-02-26 DIAGNOSIS — E890 Postprocedural hypothyroidism: Secondary | ICD-10-CM | POA: Diagnosis present

## 2021-02-26 DIAGNOSIS — Z79890 Hormone replacement therapy (postmenopausal): Secondary | ICD-10-CM

## 2021-02-26 DIAGNOSIS — I152 Hypertension secondary to endocrine disorders: Secondary | ICD-10-CM | POA: Diagnosis present

## 2021-02-26 DIAGNOSIS — M19072 Primary osteoarthritis, left ankle and foot: Secondary | ICD-10-CM | POA: Diagnosis present

## 2021-02-26 DIAGNOSIS — E039 Hypothyroidism, unspecified: Secondary | ICD-10-CM | POA: Diagnosis present

## 2021-02-26 DIAGNOSIS — R0902 Hypoxemia: Secondary | ICD-10-CM | POA: Diagnosis not present

## 2021-02-26 DIAGNOSIS — Z419 Encounter for procedure for purposes other than remedying health state, unspecified: Secondary | ICD-10-CM

## 2021-02-26 DIAGNOSIS — S72142A Displaced intertrochanteric fracture of left femur, initial encounter for closed fracture: Principal | ICD-10-CM

## 2021-02-26 DIAGNOSIS — Z89431 Acquired absence of right foot: Secondary | ICD-10-CM

## 2021-02-26 DIAGNOSIS — E119 Type 2 diabetes mellitus without complications: Secondary | ICD-10-CM

## 2021-02-26 DIAGNOSIS — Z79899 Other long term (current) drug therapy: Secondary | ICD-10-CM

## 2021-02-26 DIAGNOSIS — Z801 Family history of malignant neoplasm of trachea, bronchus and lung: Secondary | ICD-10-CM | POA: Diagnosis not present

## 2021-02-26 DIAGNOSIS — W19XXXA Unspecified fall, initial encounter: Secondary | ICD-10-CM | POA: Diagnosis not present

## 2021-02-26 DIAGNOSIS — S72002A Fracture of unspecified part of neck of left femur, initial encounter for closed fracture: Secondary | ICD-10-CM | POA: Diagnosis not present

## 2021-02-26 DIAGNOSIS — E1159 Type 2 diabetes mellitus with other circulatory complications: Secondary | ICD-10-CM | POA: Diagnosis present

## 2021-02-26 DIAGNOSIS — E669 Obesity, unspecified: Secondary | ICD-10-CM | POA: Diagnosis present

## 2021-02-26 DIAGNOSIS — Z6835 Body mass index (BMI) 35.0-35.9, adult: Secondary | ICD-10-CM | POA: Diagnosis not present

## 2021-02-26 DIAGNOSIS — M8580 Other specified disorders of bone density and structure, unspecified site: Secondary | ICD-10-CM | POA: Diagnosis present

## 2021-02-26 DIAGNOSIS — Z20822 Contact with and (suspected) exposure to covid-19: Secondary | ICD-10-CM | POA: Diagnosis present

## 2021-02-26 DIAGNOSIS — I10 Essential (primary) hypertension: Secondary | ICD-10-CM | POA: Diagnosis present

## 2021-02-26 DIAGNOSIS — Z7982 Long term (current) use of aspirin: Secondary | ICD-10-CM

## 2021-02-26 DIAGNOSIS — E785 Hyperlipidemia, unspecified: Secondary | ICD-10-CM | POA: Diagnosis present

## 2021-02-26 DIAGNOSIS — D638 Anemia in other chronic diseases classified elsewhere: Secondary | ICD-10-CM | POA: Diagnosis not present

## 2021-02-26 DIAGNOSIS — T148XXA Other injury of unspecified body region, initial encounter: Secondary | ICD-10-CM

## 2021-02-26 DIAGNOSIS — M25519 Pain in unspecified shoulder: Secondary | ICD-10-CM | POA: Diagnosis not present

## 2021-02-26 HISTORY — PX: INTRAMEDULLARY (IM) NAIL INTERTROCHANTERIC: SHX5875

## 2021-02-26 LAB — GLUCOSE, CAPILLARY
Glucose-Capillary: 135 mg/dL — ABNORMAL HIGH (ref 70–99)
Glucose-Capillary: 241 mg/dL — ABNORMAL HIGH (ref 70–99)

## 2021-02-26 LAB — BASIC METABOLIC PANEL
Anion gap: 8 (ref 5–15)
BUN: 12 mg/dL (ref 8–23)
CO2: 25 mmol/L (ref 22–32)
Calcium: 9.1 mg/dL (ref 8.9–10.3)
Chloride: 105 mmol/L (ref 98–111)
Creatinine, Ser: 0.72 mg/dL (ref 0.44–1.00)
GFR, Estimated: >60 mL/min (ref >60)
Glucose, Bld: 187 mg/dL — ABNORMAL HIGH (ref 70–99)
Potassium: 3.6 mmol/L (ref 3.5–5.1)
Sodium: 138 mmol/L (ref 135–145)

## 2021-02-26 LAB — CBC WITH DIFFERENTIAL/PLATELET
Abs Immature Granulocytes: 0.1 10*3/uL — ABNORMAL HIGH (ref 0.00–0.07)
Basophils Absolute: 0 10*3/uL (ref 0.0–0.1)
Basophils Relative: 0 %
Eosinophils Absolute: 0 10*3/uL (ref 0.0–0.5)
Eosinophils Relative: 0 %
HCT: 33.3 % — ABNORMAL LOW (ref 36.0–46.0)
Hemoglobin: 10.1 g/dL — ABNORMAL LOW (ref 12.0–15.0)
Immature Granulocytes: 1 %
Lymphocytes Relative: 9 %
Lymphs Abs: 1.1 10*3/uL (ref 0.7–4.0)
MCH: 22.2 pg — ABNORMAL LOW (ref 26.0–34.0)
MCHC: 30.3 g/dL (ref 30.0–36.0)
MCV: 73.3 fL — ABNORMAL LOW (ref 80.0–100.0)
Monocytes Absolute: 0.7 10*3/uL (ref 0.1–1.0)
Monocytes Relative: 6 %
Neutro Abs: 10 10*3/uL — ABNORMAL HIGH (ref 1.7–7.7)
Neutrophils Relative %: 84 %
Platelets: 326 10*3/uL (ref 150–400)
RBC: 4.54 MIL/uL (ref 3.87–5.11)
RDW: 18.6 % — ABNORMAL HIGH (ref 11.5–15.5)
WBC: 11.9 10*3/uL — ABNORMAL HIGH (ref 4.0–10.5)
nRBC: 0 % (ref 0.0–0.2)

## 2021-02-26 LAB — PROTIME-INR
INR: 1 (ref 0.8–1.2)
Prothrombin Time: 13 seconds (ref 11.4–15.2)

## 2021-02-26 LAB — CBG MONITORING, ED: Glucose-Capillary: 151 mg/dL — ABNORMAL HIGH (ref 70–99)

## 2021-02-26 LAB — HEMOGLOBIN A1C
Hgb A1c MFr Bld: 6.7 % — ABNORMAL HIGH (ref 4.8–5.6)
Mean Plasma Glucose: 145.59 mg/dL

## 2021-02-26 LAB — RESP PANEL BY RT-PCR (FLU A&B, COVID) ARPGX2
Influenza A by PCR: NEGATIVE
Influenza B by PCR: NEGATIVE
SARS Coronavirus 2 by RT PCR: NEGATIVE

## 2021-02-26 SURGERY — FIXATION, FRACTURE, INTERTROCHANTERIC, WITH INTRAMEDULLARY ROD
Anesthesia: General | Laterality: Left

## 2021-02-26 MED ORDER — PROPOFOL 10 MG/ML IV BOLUS
INTRAVENOUS | Status: AC
  Filled 2021-02-26: qty 20

## 2021-02-26 MED ORDER — GABAPENTIN 300 MG PO CAPS
600.0000 mg | ORAL_CAPSULE | Freq: Three times a day (TID) | ORAL | Status: DC
  Administered 2021-02-26 – 2021-03-04 (×17): 600 mg via ORAL
  Filled 2021-02-26 (×17): qty 2

## 2021-02-26 MED ORDER — OXYCODONE HCL 5 MG PO TABS
5.0000 mg | ORAL_TABLET | Freq: Once | ORAL | Status: AC | PRN
  Administered 2021-02-26: 5 mg via ORAL

## 2021-02-26 MED ORDER — LIDOCAINE 2% (20 MG/ML) 5 ML SYRINGE
INTRAMUSCULAR | Status: AC
  Filled 2021-02-26: qty 5

## 2021-02-26 MED ORDER — EPHEDRINE SULFATE-NACL 50-0.9 MG/10ML-% IV SOSY
PREFILLED_SYRINGE | INTRAVENOUS | Status: DC | PRN
  Administered 2021-02-26: 15 mg via INTRAVENOUS

## 2021-02-26 MED ORDER — HYDROXYZINE HCL 25 MG PO TABS: 25.0000 mg | ORAL_TABLET | Freq: Every evening | ORAL | Status: DC | PRN

## 2021-02-26 MED ORDER — FENTANYL CITRATE (PF) 250 MCG/5ML IJ SOLN
INTRAMUSCULAR | Status: AC
  Filled 2021-02-26: qty 5

## 2021-02-26 MED ORDER — EPHEDRINE 5 MG/ML INJ
INTRAVENOUS | Status: AC
  Filled 2021-02-26: qty 10

## 2021-02-26 MED ORDER — 0.9 % SODIUM CHLORIDE (POUR BTL) OPTIME
TOPICAL | Status: DC | PRN
  Administered 2021-02-26: 1000 mL

## 2021-02-26 MED ORDER — LORATADINE 10 MG PO TABS
5.0000 mg | ORAL_TABLET | Freq: Every day | ORAL | Status: DC
  Administered 2021-02-27 – 2021-03-04 (×6): 5 mg via ORAL
  Filled 2021-02-26 (×6): qty 1

## 2021-02-26 MED ORDER — LACTATED RINGERS IV SOLN: INTRAVENOUS | Status: DC

## 2021-02-26 MED ORDER — CHLORHEXIDINE GLUCONATE 4 % EX LIQD
60.0000 mL | Freq: Once | CUTANEOUS | Status: AC
  Administered 2021-03-02: 4 via TOPICAL
  Filled 2021-02-26: qty 60
  Filled 2021-02-26: qty 15

## 2021-02-26 MED ORDER — DEXAMETHASONE SODIUM PHOSPHATE 10 MG/ML IJ SOLN
INTRAMUSCULAR | Status: AC
  Filled 2021-02-26: qty 1

## 2021-02-26 MED ORDER — CLORAZEPATE DIPOTASSIUM 3.75 MG PO TABS
7.5000 mg | ORAL_TABLET | Freq: Every evening | ORAL | Status: DC | PRN
Start: 2021-02-26 — End: 2021-03-04

## 2021-02-26 MED ORDER — ROCURONIUM BROMIDE 10 MG/ML (PF) SYRINGE
PREFILLED_SYRINGE | INTRAVENOUS | Status: AC
  Filled 2021-02-26: qty 10

## 2021-02-26 MED ORDER — FENTANYL CITRATE (PF) 250 MCG/5ML IJ SOLN
INTRAMUSCULAR | Status: DC | PRN
  Administered 2021-02-26: 100 ug via INTRAVENOUS
  Administered 2021-02-26 (×3): 50 ug via INTRAVENOUS

## 2021-02-26 MED ORDER — LIDOCAINE 2% (20 MG/ML) 5 ML SYRINGE
INTRAMUSCULAR | Status: DC | PRN
  Administered 2021-02-26: 50 mg via INTRAVENOUS

## 2021-02-26 MED ORDER — LEVOTHYROXINE SODIUM 25 MCG PO TABS
125.0000 ug | ORAL_TABLET | Freq: Every day | ORAL | Status: DC
  Administered 2021-02-28 – 2021-03-04 (×4): 125 ug via ORAL
  Filled 2021-02-26 (×5): qty 1

## 2021-02-26 MED ORDER — PHENYLEPHRINE 40 MCG/ML (10ML) SYRINGE FOR IV PUSH (FOR BLOOD PRESSURE SUPPORT)
PREFILLED_SYRINGE | INTRAVENOUS | Status: AC
  Filled 2021-02-26: qty 10

## 2021-02-26 MED ORDER — HYDROCODONE-ACETAMINOPHEN 5-325 MG PO TABS
1.0000 | ORAL_TABLET | Freq: Four times a day (QID) | ORAL | Status: DC | PRN
  Administered 2021-02-28: 2 via ORAL
  Administered 2021-03-01: 1 via ORAL
  Administered 2021-03-03: 2 via ORAL
  Administered 2021-03-04: 1 via ORAL
  Filled 2021-02-26: qty 1
  Filled 2021-02-26: qty 2
  Filled 2021-02-26: qty 1
  Filled 2021-02-26: qty 2

## 2021-02-26 MED ORDER — GLYCOPYRROLATE PF 0.2 MG/ML IJ SOSY
PREFILLED_SYRINGE | INTRAMUSCULAR | Status: DC | PRN
  Administered 2021-02-26: .2 mg via INTRAVENOUS

## 2021-02-26 MED ORDER — HYDROMORPHONE HCL 1 MG/ML IJ SOLN
1.0000 mg | Freq: Once | INTRAMUSCULAR | Status: AC
Start: 2021-02-26 — End: 2021-02-26
  Administered 2021-02-26: 1 mg via INTRAVENOUS
  Filled 2021-02-26: qty 1

## 2021-02-26 MED ORDER — INSULIN ASPART 100 UNIT/ML ~~LOC~~ SOLN
0.0000 [IU] | Freq: Three times a day (TID) | SUBCUTANEOUS | Status: DC
  Administered 2021-02-27: 5 [IU] via SUBCUTANEOUS
  Administered 2021-02-27 (×2): 3 [IU] via SUBCUTANEOUS
  Administered 2021-02-28 (×3): 2 [IU] via SUBCUTANEOUS
  Administered 2021-03-01 (×3): 3 [IU] via SUBCUTANEOUS
  Administered 2021-03-02: 5 [IU] via SUBCUTANEOUS
  Administered 2021-03-02 – 2021-03-04 (×7): 3 [IU] via SUBCUTANEOUS

## 2021-02-26 MED ORDER — MIDAZOLAM HCL 2 MG/2ML IJ SOLN
INTRAMUSCULAR | Status: DC | PRN
  Administered 2021-02-26: 2 mg via INTRAVENOUS

## 2021-02-26 MED ORDER — ACETAMINOPHEN 325 MG PO TABS
325.0000 mg | ORAL_TABLET | Freq: Four times a day (QID) | ORAL | Status: DC | PRN
  Administered 2021-02-27: 325 mg via ORAL
  Administered 2021-02-28 – 2021-03-01 (×2): 650 mg via ORAL
  Filled 2021-02-26 (×3): qty 2

## 2021-02-26 MED ORDER — FENTANYL CITRATE (PF) 100 MCG/2ML IJ SOLN
INTRAMUSCULAR | Status: AC
  Filled 2021-02-26: qty 2

## 2021-02-26 MED ORDER — POVIDONE-IODINE 10 % EX SWAB: 2.0000 "application " | Freq: Once | CUTANEOUS | Status: DC

## 2021-02-26 MED ORDER — ONDANSETRON HCL 4 MG/2ML IJ SOLN: 4.0000 mg | Freq: Four times a day (QID) | INTRAMUSCULAR | Status: DC | PRN

## 2021-02-26 MED ORDER — ENOXAPARIN SODIUM 40 MG/0.4ML ~~LOC~~ SOLN
40.0000 mg | SUBCUTANEOUS | Status: DC
  Administered 2021-02-27 – 2021-03-04 (×6): 40 mg via SUBCUTANEOUS
  Filled 2021-02-26 (×6): qty 0.4

## 2021-02-26 MED ORDER — PHENYLEPHRINE 40 MCG/ML (10ML) SYRINGE FOR IV PUSH (FOR BLOOD PRESSURE SUPPORT)
PREFILLED_SYRINGE | INTRAVENOUS | Status: DC | PRN
  Administered 2021-02-26: 80 ug via INTRAVENOUS

## 2021-02-26 MED ORDER — HYDROMORPHONE HCL 1 MG/ML IJ SOLN: 0.5000 mg | INTRAMUSCULAR | Status: DC | PRN

## 2021-02-26 MED ORDER — VANCOMYCIN HCL 1000 MG IV SOLR
INTRAVENOUS | Status: DC | PRN
  Administered 2021-02-26: 1000 mg via TOPICAL

## 2021-02-26 MED ORDER — CEFAZOLIN SODIUM-DEXTROSE 2-4 GM/100ML-% IV SOLN
INTRAVENOUS | Status: AC
  Administered 2021-02-26: 2 g via INTRAVENOUS
  Filled 2021-02-26: qty 100

## 2021-02-26 MED ORDER — MIDAZOLAM HCL 2 MG/2ML IJ SOLN
INTRAMUSCULAR | Status: AC
  Filled 2021-02-26: qty 2

## 2021-02-26 MED ORDER — ONDANSETRON HCL 4 MG/2ML IJ SOLN
INTRAMUSCULAR | Status: AC
  Filled 2021-02-26: qty 2

## 2021-02-26 MED ORDER — ONDANSETRON HCL 4 MG/2ML IJ SOLN
INTRAMUSCULAR | Status: DC | PRN
  Administered 2021-02-26: 4 mg via INTRAVENOUS

## 2021-02-26 MED ORDER — FENTANYL CITRATE (PF) 100 MCG/2ML IJ SOLN
25.0000 ug | INTRAMUSCULAR | Status: DC | PRN
Start: 2021-02-26 — End: 2021-02-26
  Administered 2021-02-26 (×2): 50 ug via INTRAVENOUS

## 2021-02-26 MED ORDER — MORPHINE SULFATE (PF) 2 MG/ML IV SOLN: 0.5000 mg | INTRAVENOUS | Status: DC | PRN

## 2021-02-26 MED ORDER — SUGAMMADEX SODIUM 200 MG/2ML IV SOLN
INTRAVENOUS | Status: DC | PRN
  Administered 2021-02-26 (×2): 200 mg via INTRAVENOUS

## 2021-02-26 MED ORDER — OXYCODONE HCL 5 MG/5ML PO SOLN
5.0000 mg | Freq: Once | ORAL | Status: AC | PRN
Start: 2021-02-26 — End: 2021-02-26

## 2021-02-26 MED ORDER — TETANUS-DIPHTH-ACELL PERTUSSIS 5-2.5-18.5 LF-MCG/0.5 IM SUSY
0.5000 mL | PREFILLED_SYRINGE | Freq: Once | INTRAMUSCULAR | Status: AC
  Administered 2021-02-26: 0.5 mL via INTRAMUSCULAR
  Filled 2021-02-26: qty 0.5

## 2021-02-26 MED ORDER — ROSUVASTATIN CALCIUM 5 MG PO TABS
20.0000 mg | ORAL_TABLET | Freq: Every day | ORAL | Status: DC
Start: 2021-02-26 — End: 2021-03-04
  Administered 2021-02-27 – 2021-03-04 (×6): 20 mg via ORAL
  Filled 2021-02-26 (×6): qty 4

## 2021-02-26 MED ORDER — DEXAMETHASONE SODIUM PHOSPHATE 10 MG/ML IJ SOLN
INTRAMUSCULAR | Status: DC | PRN
  Administered 2021-02-26: 5 mg via INTRAVENOUS

## 2021-02-26 MED ORDER — CHLORHEXIDINE GLUCONATE 0.12 % MT SOLN
OROMUCOSAL | Status: AC
  Administered 2021-02-26: 15 mL via OROMUCOSAL
  Filled 2021-02-26: qty 15

## 2021-02-26 MED ORDER — HYDROCODONE-ACETAMINOPHEN 5-325 MG PO TABS: 1.0000 | ORAL_TABLET | Freq: Four times a day (QID) | ORAL | Status: DC | PRN

## 2021-02-26 MED ORDER — CEFAZOLIN SODIUM-DEXTROSE 2-4 GM/100ML-% IV SOLN
2.0000 g | Freq: Three times a day (TID) | INTRAVENOUS | Status: AC
  Administered 2021-02-27 (×2): 2 g via INTRAVENOUS
  Filled 2021-02-26 (×3): qty 100

## 2021-02-26 MED ORDER — VANCOMYCIN HCL 1000 MG IV SOLR
INTRAVENOUS | Status: AC
  Filled 2021-02-26: qty 1000

## 2021-02-26 MED ORDER — CHLORHEXIDINE GLUCONATE 0.12 % MT SOLN: 15.0000 mL | Freq: Once | OROMUCOSAL | Status: AC

## 2021-02-26 MED ORDER — CEFAZOLIN SODIUM-DEXTROSE 2-4 GM/100ML-% IV SOLN
2.0000 g | INTRAVENOUS | Status: AC
  Administered 2021-02-26: 2 g via INTRAVENOUS

## 2021-02-26 MED ORDER — HYDROMORPHONE HCL 1 MG/ML IJ SOLN
0.5000 mg | INTRAMUSCULAR | Status: DC | PRN
  Filled 2021-02-26: qty 1

## 2021-02-26 MED ORDER — GLYCOPYRROLATE PF 0.2 MG/ML IJ SOSY
PREFILLED_SYRINGE | INTRAMUSCULAR | Status: AC
  Filled 2021-02-26: qty 1

## 2021-02-26 MED ORDER — OXYCODONE HCL 5 MG PO TABS
ORAL_TABLET | ORAL | Status: AC
  Filled 2021-02-26: qty 1

## 2021-02-26 MED ORDER — PROPOFOL 10 MG/ML IV BOLUS
INTRAVENOUS | Status: DC | PRN
  Administered 2021-02-26: 120 mg via INTRAVENOUS

## 2021-02-26 MED ORDER — HYDROCODONE-ACETAMINOPHEN 7.5-325 MG PO TABS
1.0000 | ORAL_TABLET | Freq: Four times a day (QID) | ORAL | Status: DC | PRN
  Administered 2021-02-26 – 2021-02-28 (×2): 2 via ORAL
  Filled 2021-02-26 (×2): qty 2

## 2021-02-26 MED ORDER — ONDANSETRON HCL 4 MG/2ML IJ SOLN
4.0000 mg | Freq: Once | INTRAMUSCULAR | Status: AC
  Administered 2021-02-26: 4 mg via INTRAVENOUS
  Filled 2021-02-26: qty 2

## 2021-02-26 MED ORDER — CELECOXIB 100 MG PO CAPS
100.0000 mg | ORAL_CAPSULE | Freq: Every day | ORAL | Status: DC
  Administered 2021-02-27 – 2021-03-04 (×6): 100 mg via ORAL
  Filled 2021-02-26 (×8): qty 1

## 2021-02-26 MED ORDER — ROCURONIUM BROMIDE 10 MG/ML (PF) SYRINGE
PREFILLED_SYRINGE | INTRAVENOUS | Status: DC | PRN
  Administered 2021-02-26: 50 mg via INTRAVENOUS
  Administered 2021-02-26: 20 mg via INTRAVENOUS

## 2021-02-26 SURGICAL SUPPLY — 46 items
BIT DRILL INTERTAN LAG SCREW (BIT) ×2 IMPLANT
BIT DRILL LONG 4.0 (BIT) ×1 IMPLANT
BRUSH SCRUB EZ PLAIN DRY (MISCELLANEOUS) ×4 IMPLANT
CHLORAPREP W/TINT 26 (MISCELLANEOUS) ×2 IMPLANT
COVER PERINEAL POST (MISCELLANEOUS) ×2 IMPLANT
COVER SURGICAL LIGHT HANDLE (MISCELLANEOUS) ×2 IMPLANT
DERMABOND ADVANCED (GAUZE/BANDAGES/DRESSINGS) ×1
DERMABOND ADVANCED .7 DNX12 (GAUZE/BANDAGES/DRESSINGS) ×1 IMPLANT
DRAPE C-ARM 35X43 STRL (DRAPES) ×2 IMPLANT
DRAPE IMP U-DRAPE 54X76 (DRAPES) ×4 IMPLANT
DRAPE INCISE IOBAN 66X45 STRL (DRAPES) ×2 IMPLANT
DRAPE STERI IOBAN 125X83 (DRAPES) ×2 IMPLANT
DRAPE SURG 17X23 STRL (DRAPES) ×4 IMPLANT
DRAPE U-SHAPE 47X51 STRL (DRAPES) ×2 IMPLANT
DRESSING MEPILEX FLEX 4X4 (GAUZE/BANDAGES/DRESSINGS) ×1 IMPLANT
DRILL BIT LONG 4.0 (BIT) ×2
DRSG MEPILEX BORDER 4X4 (GAUZE/BANDAGES/DRESSINGS) ×2 IMPLANT
DRSG MEPILEX BORDER 4X8 (GAUZE/BANDAGES/DRESSINGS) ×2 IMPLANT
DRSG MEPILEX FLEX 4X4 (GAUZE/BANDAGES/DRESSINGS) ×2
GLOVE BIO SURGEON STRL SZ 6.5 (GLOVE) ×6 IMPLANT
GLOVE BIO SURGEON STRL SZ7.5 (GLOVE) ×8 IMPLANT
GLOVE BIOGEL PI IND STRL 7.5 (GLOVE) ×1 IMPLANT
GLOVE BIOGEL PI INDICATOR 7.5 (GLOVE) ×1
GLOVE SURG UNDER POLY LF SZ6.5 (GLOVE) ×2 IMPLANT
GOWN STRL REUS W/ TWL LRG LVL3 (GOWN DISPOSABLE) ×1 IMPLANT
GOWN STRL REUS W/TWL LRG LVL3 (GOWN DISPOSABLE) ×1
GUIDE PIN 3.2X343 (PIN) ×1
GUIDE PIN 3.2X343MM (PIN) ×1
KIT BASIN OR (CUSTOM PROCEDURE TRAY) ×2 IMPLANT
KIT TURNOVER KIT B (KITS) ×2 IMPLANT
NAIL INTERTAN 10X18 130D 10S (Nail) ×2 IMPLANT
NS IRRIG 1000ML POUR BTL (IV SOLUTION) ×2 IMPLANT
PACK GENERAL/GYN (CUSTOM PROCEDURE TRAY) ×2 IMPLANT
PAD ARMBOARD 7.5X6 YLW CONV (MISCELLANEOUS) ×4 IMPLANT
PIN GUIDE 3.2X343MM (PIN) ×1 IMPLANT
SCREW LAG COMPR KIT 90/85 (Screw) ×2 IMPLANT
SCREW TRIGEN LOW PROF 5.0X35 (Screw) ×2 IMPLANT
SUT MNCRL AB 3-0 PS2 18 (SUTURE) ×2 IMPLANT
SUT MON AB 3-0 SH 27 (SUTURE) ×1
SUT MON AB 3-0 SH27 (SUTURE) ×1 IMPLANT
SUT VIC AB 0 CT1 27 (SUTURE)
SUT VIC AB 0 CT1 27XBRD ANBCTR (SUTURE) IMPLANT
SUT VIC AB 2-0 CT1 27 (SUTURE) ×2
SUT VIC AB 2-0 CT1 TAPERPNT 27 (SUTURE) ×2 IMPLANT
TOWEL GREEN STERILE (TOWEL DISPOSABLE) ×4 IMPLANT
WATER STERILE IRR 1000ML POUR (IV SOLUTION) ×2 IMPLANT

## 2021-02-26 NOTE — Anesthesia Procedure Notes (Signed)
Procedure Name: Intubation Date/Time: 02/26/2021 2:08 PM Performed by: Michele Rockers, CRNA Pre-anesthesia Checklist: Patient identified, Patient being monitored, Timeout performed, Emergency Drugs available and Suction available Patient Re-evaluated:Patient Re-evaluated prior to induction Oxygen Delivery Method: Circle System Utilized Preoxygenation: Pre-oxygenation with 100% oxygen Induction Type: IV induction Ventilation: Mask ventilation without difficulty and Oral airway inserted - appropriate to patient size Laryngoscope Size: Sabra Heck and 2 Grade View: Grade I Tube type: Oral Tube size: 7.0 mm Number of attempts: 1 Airway Equipment and Method: Stylet Placement Confirmation: ETT inserted through vocal cords under direct vision,  positive ETCO2 and breath sounds checked- equal and bilateral Secured at: 21 cm Tube secured with: Tape Dental Injury: Teeth and Oropharynx as per pre-operative assessment

## 2021-02-26 NOTE — ED Notes (Signed)
Help placed a external cath patient is resting with call bell in reach

## 2021-02-26 NOTE — Plan of Care (Signed)
  Problem: Education: Goal: Knowledge of General Education information will improve Description: Including pain rating scale, medication(s)/side effects and non-pharmacologic comfort measures Outcome: Progressing   Problem: Nutrition: Goal: Adequate nutrition will be maintained Outcome: Progressing   

## 2021-02-26 NOTE — ED Notes (Signed)
Pt's oxygen dropped to 86% with good wave form after receiving IV pain medication. Pt placed on 2L. Will continue to monitor
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
## 2021-02-26 NOTE — ED Triage Notes (Signed)
Pt here from work where she tripped and fell landing on her left hip . Pt states that she felt a "pop" along with left shoulder pain

## 2021-02-26 NOTE — Anesthesia Preprocedure Evaluation (Signed)
Anesthesia Evaluation  Patient identified by MRN, date of birth, ID band Patient awake    Reviewed: Allergy & Precautions, H&P , NPO status , Patient's Chart, lab work & pertinent test results  Airway Mallampati: II   Neck ROM: full    Dental   Pulmonary Current Smoker,    breath sounds clear to auscultation       Cardiovascular hypertension,  Rhythm:regular Rate:Normal     Neuro/Psych  Neuromuscular disease    GI/Hepatic   Endo/Other  diabetes, Type 2Hypothyroidism   Renal/GU      Musculoskeletal  (+) Arthritis ,   Abdominal   Peds  Hematology  (+) Blood dyscrasia, anemia ,   Anesthesia Other Findings   Reproductive/Obstetrics                             Anesthesia Physical Anesthesia Plan  ASA: III  Anesthesia Plan: General   Post-op Pain Management:    Induction: Intravenous  PONV Risk Score and Plan: 2 and Ondansetron, Dexamethasone and Treatment may vary due to age or medical condition  Airway Management Planned: Oral ETT  Additional Equipment:   Intra-op Plan:   Post-operative Plan: Extubation in OR  Informed Consent: I have reviewed the patients History and Physical, chart, labs and discussed the procedure including the risks, benefits and alternatives for the proposed anesthesia with the patient or authorized representative who has indicated his/her understanding and acceptance.     Dental advisory given  Plan Discussed with: CRNA, Anesthesiologist and Surgeon  Anesthesia Plan Comments:         Anesthesia Quick Evaluation
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
## 2021-02-26 NOTE — Transfer of Care (Signed)
Immediate Anesthesia Transfer of Care Note  Patient: Pamela Cherry  Procedure(s) Performed: INTRAMEDULLARY (IM) NAIL INTERTROCHANTRIC (Left )  Patient Location: PACU  Anesthesia Type:General  Level of Consciousness: drowsy  Airway & Oxygen Therapy: Patient Spontanous Breathing and Patient connected to face mask oxygen  Post-op Assessment: Report given to RN and Post -op Vital signs reviewed and stable  Post vital signs: Reviewed and stable  Last Vitals:  Vitals Value Taken Time  BP 139/66 02/26/21 1547  Temp    Pulse 110 02/26/21 1554  Resp 21 02/26/21 1554  SpO2 92 % 02/26/21 1554  Vitals shown include unvalidated device data.  Last Pain:  Vitals:   02/26/21 1308  TempSrc: Oral  PainSc:          Complications: No complications documented.

## 2021-02-26 NOTE — Progress Notes (Signed)
Patient admitted from PACU. Vitals done. continuous SPO2 monitor on. Patient missing her pocket book and prosthesis. PACU contacted  and will bring it up to her room

## 2021-02-26 NOTE — Op Note (Signed)
Orthopaedic Surgery Operative Note (CSN: [id number] ) Date of Surgery: 02/26/2021  Admit Date: 02/26/2021   Diagnoses: Pre-Op Diagnoses: Left intertrochanteric femur fracture   Post-Op Diagnosis: Same  Procedures: CPT 27245-Cephalomedullary nailing of left intertrochanteric femur fracture   Surgeons : Primary: Shona Needles, MD  Assistant: Patrecia Pace, PA-C  Location: OR 3  Anesthesia:General  Antibiotics: Ancef 2g preop with 1 gm vancomycin powder placed topically   Tourniquet time:None  Estimated Blood FTDD:220 mL  Complications:None   Specimens:None   Implants: Implant Name Type Inv. Item Serial No. Manufacturer Lot No. LRB No. Used Action  NAIL INTERTAN 10X18 130D 10S - [id number] Nail NAIL INTERTAN 10X18 130D 10S  [employer] [id number] Left 1 Implanted  SCREW LAG COMBO 90.85 - [id number] Screw SCREW LAG COMBO 90.85  [employer] [id number] Left 1 Implanted  SCREW TRIGEN LOW PROF 5.0X35 - [id number] Screw SCREW TRIGEN LOW PROF 5.0X35  [employer] [id number] Left 1 Implanted     Indications for Surgery: 70 year old female who sustained a ground-level fall.  She has a left intertrochanteric femur fracture.  Due to the unstable nature of her injury I recommend proceeding with cephalomedullary nail in the left hip.  Risks and benefits were discussed with the patient.  Risks included but not limited to bleeding, infection, malunion, nonunion, hardware failure, hardware irritation, nerve or blood vessel injury, DVT, even the possibility anesthetic complications.  The patient agreed to proceed with surgery and consent was obtained.  Operative Findings: Cephalomedullary nailing of left intertrochanteric femur fracture using [employer] InterTAN 10 x 180 mm nail with a 95 mm lag screw and a 90 mm compression screw.  Procedure: The patient was identified in the preoperative holding area. Consent was confirmed with the patient  and their family and all questions were answered. The operative extremity was marked after confirmation with the patient. she was then brought back to the operating room by our anesthesia colleagues.  She was placed under general anesthetic and carefully transferred over to the [facility] table.  All bony prominences were well-padded.  Traction was applied to the left lower extremity.  Fluoroscopic imaging showed adequate reduction of the proximal femur fracture.  The left lower extremity was then prepped and draped in usual sterile fashion.  A timeout was performed to verify the patient, the procedure, and the extremity.  Preoperative antibiotics were dosed.  A small incision proximal to the greater trochanter was made and carried down through skin and subcutaneous tissue.  The gluteal fascia was split in line with the incision.  A threaded guidewire was placed at the tip of the greater trochanter and advanced into the proximal metaphysis.  An entry reamer was used to enter the medullary canal.  I then passed the 10 x 180 mm InterTAN nail down the center of the canal and seated it until it was in appropriate position.  I then made a lateral incision along the femur.  I used the targeting arm to place a threaded guidewire into the head neck segment.  I confirmed adequate tip apex distance using fluoroscopy.  I then drilled the path for the compression screw.  I placed an antirotation bar.  I then drilled the path for the lag screw.  The lag screw was placed.  I then placed a compression screw and applied approximately 5 mm of compression.  The nail was statically locked.  I then used the targeting arm to place a distal interlocking screw.  Final  fluoroscopic imaging was obtained.  The incision was copiously irrigated.  A gram of vancomycin powder was placed into the incision.  A layer closure of 2-0 Vicryl and 3-0 Monocryl with Dermabond was used to close the skin.  Sterile dressings were placed.  The patient was then  awoken from anesthesia and taken to the PACU in stable condition.   Post Op Plan/Instructions: Patient will be weightbearing as tolerated to the left lower extremity.  She will receive postoperative Ancef.  She will receive Lovenox for DVT prophylaxis.  We will have her mobilize with physical and Occupational Therapy.  I was present and performed the entire surgery.  Patrecia Pace, PA-C did assist me throughout the case. An assistant was necessary given the difficulty in approach, maintenance of reduction and ability to instrument the fracture.   Katha Hamming, MD [facility]
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
## 2021-02-27 ENCOUNTER — Encounter (HOSPITAL_COMMUNITY): Payer: Self-pay | Admitting: Student

## 2021-02-27 DIAGNOSIS — S98011A Complete traumatic amputation of right foot at ankle level, initial encounter: Secondary | ICD-10-CM

## 2021-02-27 DIAGNOSIS — E039 Hypothyroidism, unspecified: Secondary | ICD-10-CM

## 2021-02-27 DIAGNOSIS — I10 Essential (primary) hypertension: Secondary | ICD-10-CM

## 2021-02-27 DIAGNOSIS — S72142A Displaced intertrochanteric fracture of left femur, initial encounter for closed fracture: Principal | ICD-10-CM

## 2021-02-27 LAB — BASIC METABOLIC PANEL
Anion gap: 8 (ref 5–15)
BUN: 17 mg/dL (ref 8–23)
CO2: 27 mmol/L (ref 22–32)
Calcium: 8.6 mg/dL — ABNORMAL LOW (ref 8.9–10.3)
Chloride: 99 mmol/L (ref 98–111)
Creatinine, Ser: 0.91 mg/dL (ref 0.44–1.00)
GFR, Estimated: >60 mL/min (ref >60)
Glucose, Bld: 254 mg/dL — ABNORMAL HIGH (ref 70–99)
Potassium: 4.6 mmol/L (ref 3.5–5.1)
Sodium: 134 mmol/L — ABNORMAL LOW (ref 135–145)

## 2021-02-27 LAB — GLUCOSE, CAPILLARY
Glucose-Capillary: 211 mg/dL — ABNORMAL HIGH (ref 70–99)
Glucose-Capillary: 192 mg/dL — ABNORMAL HIGH (ref 70–99)
Glucose-Capillary: 152 mg/dL — ABNORMAL HIGH (ref 70–99)
Glucose-Capillary: 231 mg/dL — ABNORMAL HIGH (ref 70–99)

## 2021-02-27 LAB — CBC
HCT: 27.9 % — ABNORMAL LOW (ref 36.0–46.0)
Hemoglobin: 8.3 g/dL — ABNORMAL LOW (ref 12.0–15.0)
MCH: 22.1 pg — ABNORMAL LOW (ref 26.0–34.0)
MCHC: 29.7 g/dL — ABNORMAL LOW (ref 30.0–36.0)
MCV: 74.4 fL — ABNORMAL LOW (ref 80.0–100.0)
Platelets: 304 10*3/uL (ref 150–400)
RBC: 3.75 MIL/uL — ABNORMAL LOW (ref 3.87–5.11)
RDW: 18.8 % — ABNORMAL HIGH (ref 11.5–15.5)
WBC: 12.6 10*3/uL — ABNORMAL HIGH (ref 4.0–10.5)
nRBC: 0 % (ref 0.0–0.2)

## 2021-02-27 LAB — VITAMIN D 25 HYDROXY (VIT D DEFICIENCY, FRACTURES): Vit D, 25-Hydroxy: 16.76 ng/mL — ABNORMAL LOW (ref 30–100)

## 2021-02-27 MED ORDER — NICOTINE 21 MG/24HR TD PT24
21.0000 mg | MEDICATED_PATCH | Freq: Every day | TRANSDERMAL | Status: DC
  Administered 2021-02-27 – 2021-03-04 (×6): 21 mg via TRANSDERMAL
  Filled 2021-02-27 (×6): qty 1

## 2021-02-27 MED ORDER — VITAMIN D 25 MCG (1000 UNIT) PO TABS
1000.0000 [IU] | ORAL_TABLET | Freq: Two times a day (BID) | ORAL | Status: DC
  Administered 2021-02-27 – 2021-03-04 (×11): 1000 [IU] via ORAL
  Filled 2021-02-27 (×11): qty 1

## 2021-02-27 MED ORDER — FERROUS SULFATE 325 (65 FE) MG PO TABS
325.0000 mg | ORAL_TABLET | Freq: Every day | ORAL | Status: DC
  Administered 2021-02-27 – 2021-03-04 (×6): 325 mg via ORAL
  Filled 2021-02-27 (×6): qty 1

## 2021-02-27 MED ORDER — ADULT MULTIVITAMIN W/MINERALS CH
1.0000 | ORAL_TABLET | Freq: Every day | ORAL | Status: DC
  Administered 2021-02-27 – 2021-03-04 (×6): 1 via ORAL
  Filled 2021-02-27 (×6): qty 1

## 2021-02-27 MED ORDER — HYDROCODONE-ACETAMINOPHEN 7.5-325 MG PO TABS: 1.0000 | ORAL_TABLET | Freq: Four times a day (QID) | ORAL | 0 refills | Status: DC | PRN

## 2021-02-27 MED ORDER — ENOXAPARIN SODIUM 40 MG/0.4ML ~~LOC~~ SOLN: 40.0000 mg | SUBCUTANEOUS | 0 refills | Status: DC

## 2021-02-27 NOTE — Progress Notes (Signed)
Orthopaedic Trauma Progress Note  SUBJECTIVE: Working with physical therapy. Reports mild pain about operative site. No chest pain. No SOB. No nausea/vomiting. No other complaints.   OBJECTIVE:  Vitals:   02/27/21 0000 02/27/21 0406  BP: (!) 154/75 (!) 134/99  Pulse: 84 79  Resp: 17 18  Temp: 98.9 F (37.2 C) 98 F (36.7 C)  SpO2: 98% 100%    General: Sitting up in bed, no acute distress Respiratory: No increased work of breathing.  Left lower extremity: Dressings C/D/I. Moving extremity. Extremity warm. Neurovascularly at baseline  IMAGING: Stable post op imaging.   LABS:  Results for orders placed or performed during the hospital encounter of 02/26/21 (from the past 24 hour(s))  Basic metabolic panel     Status: Abnormal   Collection Time: 02/26/21 10:33 AM  Result Value Ref Range   Sodium 138 135 - 145 mmol/L   Potassium 3.6 3.5 - 5.1 mmol/L   Chloride 105 98 - 111 mmol/L   CO2 25 22 - 32 mmol/L   Glucose, Bld 187 (H) 70 - 99 mg/dL   BUN 12 8 - 23 mg/dL   Creatinine, Ser 0.72 0.44 - 1.00 mg/dL   Calcium 9.1 8.9 - 10.3 mg/dL   GFR, Estimated >60 >60 mL/min   Anion gap 8 5 - 15  CBC WITH DIFFERENTIAL     Status: Abnormal   Collection Time: 02/26/21 10:33 AM  Result Value Ref Range   WBC 11.9 (H) 4.0 - 10.5 K/uL   RBC 4.54 3.87 - 5.11 MIL/uL   Hemoglobin 10.1 (L) 12.0 - 15.0 g/dL   HCT 33.3 (L) 36.0 - 46.0 %   MCV 73.3 (L) 80.0 - 100.0 fL   MCH 22.2 (L) 26.0 - 34.0 pg   MCHC 30.3 30.0 - 36.0 g/dL   RDW 18.6 (H) 11.5 - 15.5 %   Platelets 326 150 - 400 K/uL   nRBC 0.0 0.0 - 0.2 %   Neutrophils Relative % 84 %   Neutro Abs 10.0 (H) 1.7 - 7.7 K/uL   Lymphocytes Relative 9 %   Lymphs Abs 1.1 0.7 - 4.0 K/uL   Monocytes Relative 6 %   Monocytes Absolute 0.7 0.1 - 1.0 K/uL   Eosinophils Relative 0 %   Eosinophils Absolute 0.0 0.0 - 0.5 K/uL   Basophils Relative 0 %   Basophils Absolute 0.0 0.0 - 0.1 K/uL   Immature Granulocytes 1 %   Abs Immature Granulocytes 0.10  (H) 0.00 - 0.07 K/uL  Protime-INR     Status: None   Collection Time: 02/26/21 10:33 AM  Result Value Ref Range   Prothrombin Time 13.0 11.4 - 15.2 seconds   INR 1.0 0.8 - 1.2  Hemoglobin A1c     Status: Abnormal   Collection Time: 02/26/21 10:33 AM  Result Value Ref Range   Hgb A1c MFr Bld 6.7 (H) 4.8 - 5.6 %   Mean Plasma Glucose 145.59 mg/dL  Resp Panel by RT-PCR (Flu A&B, Covid) Nasopharyngeal Swab     Status: None   Collection Time: 02/26/21 11:12 AM   Specimen: Nasopharyngeal Swab; Nasopharyngeal(NP) swabs in vial transport medium  Result Value Ref Range   SARS Coronavirus 2 by RT PCR NEGATIVE NEGATIVE   Influenza A by PCR NEGATIVE NEGATIVE   Influenza B by PCR NEGATIVE NEGATIVE  Type and screen [place]     Status: None   Collection Time: 02/26/21 12:20 PM  Result Value Ref Range   ABO/RH(D) A  POS    Antibody Screen NEG    Sample Expiration      03/01/2021,2359 Performed at [facility], [place] [address] [place], [place] [address]   CBG monitoring, ED     Status: Abnormal   Collection Time: 02/26/21  1:11 PM  Result Value Ref Range   Glucose-Capillary 151 (H) 70 - 99 mg/dL  Glucose, capillary     Status: Abnormal   Collection Time: 02/26/21  3:46 PM  Result Value Ref Range   Glucose-Capillary 135 (H) 70 - 99 mg/dL  Glucose, capillary     Status: Abnormal   Collection Time: 02/26/21  8:36 PM  Result Value Ref Range   Glucose-Capillary 241 (H) 70 - 99 mg/dL  CBC     Status: Abnormal   Collection Time: 02/27/21  4:19 AM  Result Value Ref Range   WBC 12.6 (H) 4.0 - 10.5 K/uL   RBC 3.75 (L) 3.87 - 5.11 MIL/uL   Hemoglobin 8.3 (L) 12.0 - 15.0 g/dL   HCT 27.9 (L) 36.0 - 46.0 %   MCV 74.4 (L) 80.0 - 100.0 fL   MCH 22.1 (L) 26.0 - 34.0 pg   MCHC 29.7 (L) 30.0 - 36.0 g/dL   RDW 18.8 (H) 11.5 - 15.5 %   Platelets 304 150 - 400 K/uL   nRBC 0.0 0.0 - 0.2 %  Basic metabolic panel     Status: Abnormal   Collection Time: 02/27/21  4:19 AM  Result  Value Ref Range   Sodium 134 (L) 135 - 145 mmol/L   Potassium 4.6 3.5 - 5.1 mmol/L   Chloride 99 98 - 111 mmol/L   CO2 27 22 - 32 mmol/L   Glucose, Bld 254 (H) 70 - 99 mg/dL   BUN 17 8 - 23 mg/dL   Creatinine, Ser 0.91 0.44 - 1.00 mg/dL   Calcium 8.6 (L) 8.9 - 10.3 mg/dL   GFR, Estimated >60 >60 mL/min   Anion gap 8 5 - 15    ASSESSMENT: Pamela Cherry is a 70 y.o. female, 1 Day Post-Op s/p  INTRAMEDULLARY NAIL LEFT INTERTROCHANTRIC FEMUR FRACTURE  CV/Blood loss: Acute blood loss anemia, Hgb 8.3 this morning.   PLAN: Weightbearing: WBAT LLE Incisional and dressing care: Reinforce dressings as needed  Showering: Okay to begin showering 03/01/2021 if no drainage from incisions.  Incisions okay to get wet at this point. Orthopedic device(s): None  Pain management:  1. Tylenol 325-650 mg q 6 hours PRN 2. Norco 7.5-326 mg q 6 hours PRN severe pain 3. Norco 5-326 mg q 6 hours PRN moderate pain 4. Neurontin 600 mg TID 5. Dilaudid 0.5 mg q 3 hours PRN 6. Celebrex 100 mg daily VTE prophylaxis: Lovenox, SCDs ID:  Ancef 2gm post op Foley/Lines:  No foley, KVO IVFs Impediments to Fracture Healing: Vitamin D level 16, start supplementation Dispo: PT/OT evaluation today, dispo pending.  Follow - up plan: 2 weeks after discharge for repeat x-rays and wound check  Contact information:  Katha Hamming MD, Patrecia Pace PA-C. After hours and holidays please check [email or web address] for group call information for [facility]   Pamela Severino A. Ricci Barker, PA-C [phone] (office) [email or web address]
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
## 2021-02-27 NOTE — Plan of Care (Signed)
  Problem: Education: Goal: Knowledge of General Education information will improve Description: Including pain rating scale, medication(s)/side effects and non-pharmacologic comfort measures Outcome: Progressing   Problem: Health Behavior/Discharge Planning: Goal: Ability to manage health-related needs will improve Outcome: Progressing   Problem: Clinical Measurements: Goal: Will remain free from infection Outcome: Progressing   
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
## 2021-02-27 NOTE — TOC CAGE-AID Note (Signed)
Transition of Care [facility]) - CAGE-AID Screening   Patient Details  Name: Pamela Cherry MRN: [id number] Date of Birth: 1951/04/01     Elvina Sidle, RN  Trauma Response Nurse  02/27/2021, 12:07 PM       CAGE-AID Screening:    Have You Ever Felt You Ought to Cut Down on Your Drinking or Drug Use?: No Have People Annoyed You By [employer] Your Drinking Or Drug Use?: No Have You Felt Bad Or Guilty About Your Drinking Or Drug Use?: No Have You Ever Had a Drink or Used Drugs First Thing In The Morning to Steady Your Nerves or to Get Rid of a Hangover?: No CAGE-AID Score: 0  Substance Abuse Education Offered: No (pt denies any alcohol/drug use)

## 2021-02-27 NOTE — Anesthesia Postprocedure Evaluation (Signed)
Anesthesia Post Note  Patient: Pamela Cherry  Procedure(s) Performed: INTRAMEDULLARY (IM) NAIL INTERTROCHANTRIC (Left )     Patient location during evaluation: PACU Anesthesia Type: General Level of consciousness: awake and alert Pain management: pain level controlled Vital Signs Assessment: post-procedure vital signs reviewed and stable Respiratory status: spontaneous breathing, nonlabored ventilation, respiratory function stable and patient connected to nasal cannula oxygen Cardiovascular status: blood pressure returned to baseline and stable Postop Assessment: no apparent nausea or vomiting Anesthetic complications: no   No complications documented.  Last Vitals:  Vitals:   02/27/21 0000 02/27/21 0406  BP: (!) 154/75 (!) 134/99  Pulse: 84 79  Resp: 17 18  Temp: 37.2 C 36.7 C  SpO2: 98% 100%    Last Pain:  Vitals:   02/27/21 0000  TempSrc: Oral  PainSc:                  [place]
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
## 2021-02-27 NOTE — Progress Notes (Signed)
PROGRESS NOTE    Pamela Cherry  CHY:[id number] DOB: 03-12-1951 DOA: 02/26/2021 PCP: Susy Frizzle, MD    Brief Narrative:  70 year old female with a history of diabetes, hyperlipidemia, prior right foot amputation, admitted to the hospital with mechanical fall.  Found to have left hip fracture.  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 [facility])   Essential hypertension   Type 2 diabetes mellitus without complication (HCC)   Closed left hip fracture secondary to mechanical fall -Status post operative repair on 3/17 -Seen by PT/OT with recommendations for SNF placement -Continue pain management  Hypertension -Blood pressures were elevated on admission, but this may have been related to pain -Continue to monitor, will consider ACE inhibitor if remain elevated  Diabetes -Chronically on Metformin, pioglitazone and glipizide which were held on admission -Currently on sliding scale insulin -Blood sugars currently stable  Anemia -Hemoglobin of 10.1 on admission which is close to baseline -Postoperative hemoglobin 8.3, likely related to intraoperative blood loss as well as hemodilution with IV fluids -Continue to follow hemoglobin  Hypothyroidism -Continue on Synthroid  Hyperlipidemia -Continue on statin  Tobacco use -Tobacco cessation counseling -Nicotine patch  DVT prophylaxis: enoxaparin (LOVENOX) injection 40 mg Start: 02/27/21 0900 SCDs Start: 02/26/21 1325  Code Status: Full code Family Communication: No family present Disposition Plan: Status is: Inpatient  Remains inpatient appropriate because:Inpatient level of care appropriate due to severity of illness   Dispo: The patient is from: Home              Anticipated d/c is to: SNF              Patient currently is not medically stable to d/c.    Difficult to place patient No   Consultants:   Orthopedics, Dr. Doreatha Martin  Procedures:  Cephalomedullary nailing of left intertrochanteric femur fracture  Antimicrobials:       Subjective: Reports pain is reasonably controlled.  No shortness of breath or cough at this time.  No wheezing  Objective: Vitals:   02/27/21 0406 02/27/21 0804 02/27/21 1134 02/27/21 1358  BP: (!) 134/99 (!) 157/74  (!) 128/59  Pulse: 79 74  84  Resp: 18 18  18   Temp: 98 F (36.7 C)     TempSrc:      SpO2: 100% 100% 97% 95%  Weight:      Height:        Intake/Output Summary (Last 24 hours) at 02/27/2021 1920 Last data filed at 02/27/2021 1700 Gross per 24 hour  Intake 720 ml  Output 400 ml  Net 320 ml   Filed Weights   02/26/21 1335  Weight: 94.8 kg    Examination:  General exam: Appears calm and comfortable  Respiratory system: Clear to auscultation. Respiratory effort normal. Cardiovascular system: S1 & S2 heard, RRR. No JVD, murmurs, rubs, gallops or clicks. No pedal edema. Gastrointestinal system: Abdomen is nondistended, soft and nontender. No organomegaly or masses felt. Normal bowel sounds heard. Central nervous system: Alert and oriented. No focal neurological deficits. Extremities: Right below-knee amputation Skin: No rashes, lesions or ulcers Psychiatry: Judgement and insight appear normal. Mood & affect appropriate.     Data Reviewed: I have personally reviewed following labs and imaging studies  CBC: Recent Labs  Lab 02/26/21 1033 02/27/21 0419  WBC 11.9* 12.6*  NEUTROABS 10.0*  --  HGB 10.1* 8.3*  HCT 33.3* 27.9*  MCV 73.3* 74.4*  PLT 326 962   Basic Metabolic Panel: Recent Labs  Lab 02/26/21 1033 02/27/21 0419  NA 138 134*  K 3.6 4.6  CL 105 99  CO2 25 27  GLUCOSE 187* 254*  BUN 12 17  CREATININE 0.72 0.91  CALCIUM 9.1 8.6*   GFR: Estimated Creatinine Clearance: 63.3 mL/min (by C-G formula based on SCr of 0.91 mg/dL). Liver Function Tests: No  results for input(s): AST, ALT, ALKPHOS, BILITOT, PROT, ALBUMIN in the last 168 hours. No results for input(s): LIPASE, AMYLASE in the last 168 hours. No results for input(s): AMMONIA in the last 168 hours. Coagulation Profile: Recent Labs  Lab 02/26/21 1033  INR 1.0   Cardiac Enzymes: No results for input(s): CKTOTAL, CKMB, CKMBINDEX, TROPONINI in the last 168 hours. BNP (last 3 results) No results for input(s): PROBNP in the last 8760 hours. HbA1C: Recent Labs    02/26/21 1033  HGBA1C 6.7*   CBG: Recent Labs  Lab 02/26/21 1546 02/26/21 2036 02/27/21 0802 02/27/21 1257 02/27/21 1643  GLUCAP 135* 241* 211* 192* 152*   Lipid Profile: No results for input(s): CHOL, HDL, LDLCALC, TRIG, CHOLHDL, LDLDIRECT in the last 72 hours. Thyroid Function Tests: No results for input(s): TSH, T4TOTAL, FREET4, T3FREE, THYROIDAB in the last 72 hours. Anemia Panel: No results for input(s): VITAMINB12, FOLATE, FERRITIN, TIBC, IRON, RETICCTPCT in the last 72 hours. Sepsis Labs: No results for input(s): PROCALCITON, LATICACIDVEN in the last 168 hours.  Recent Results (from the past 240 hour(s))  Resp Panel by RT-PCR (Flu A&B, Covid) Nasopharyngeal Swab     Status: None   Collection Time: 02/26/21 11:12 AM   Specimen: Nasopharyngeal Swab; Nasopharyngeal(NP) swabs in vial transport medium  Result Value Ref Range Status   SARS Coronavirus 2 by RT PCR NEGATIVE NEGATIVE Final    Comment: (NOTE) SARS-CoV-2 target nucleic acids are NOT DETECTED.  The SARS-CoV-2 RNA is generally detectable in upper respiratory specimens during the acute phase of infection. The lowest concentration of SARS-CoV-2 viral copies this assay can detect is 138 copies/mL. A negative result does not preclude SARS-Cov-2 infection and should not be used as the sole basis for treatment or other patient management decisions. A negative result may occur with  improper specimen collection/handling, submission of specimen  other than nasopharyngeal swab, presence of viral mutation(s) within the areas targeted by this assay, and inadequate number of viral copies(<138 copies/mL). A negative result must be combined with clinical observations, patient history, and epidemiological information. The expected result is Negative.  Fact Sheet for Patients:  EntrepreneurPulse.com.au  Fact Sheet for Healthcare Providers:  IncredibleEmployment.be  This test is no t yet approved or cleared by the [place] FDA and  has been authorized for detection and/or diagnosis of SARS-CoV-2 by FDA under an Emergency Use Authorization (EUA). This EUA will remain  in effect (meaning this test can be used) for the duration of the COVID-19 declaration under Section 564(b)(1) of the Act, 21 U.S.C.section 360bbb-3(b)(1), unless the authorization is terminated  or revoked sooner.       Influenza A by PCR NEGATIVE NEGATIVE Final   Influenza B by PCR NEGATIVE NEGATIVE Final    Comment: (NOTE) The Xpert Xpress SARS-CoV-2/FLU/RSV plus assay is intended as an aid in the diagnosis of influenza from Nasopharyngeal swab specimens and should not be used as a sole basis for treatment. Nasal washings and aspirates are unacceptable for Xpert Xpress SARS-CoV-2/FLU/RSV testing.  Fact Sheet  for Patients: EntrepreneurPulse.com.au  Fact Sheet for Healthcare Providers: IncredibleEmployment.be  This test is not yet approved or cleared by the [place] FDA and has been authorized for detection and/or diagnosis of SARS-CoV-2 by FDA under an Emergency Use Authorization (EUA). This EUA will remain in effect (meaning this test can be used) for the duration of the COVID-19 declaration under Section 564(b)(1) of the Act, 21 U.S.C. section 360bbb-3(b)(1), unless the authorization is terminated or revoked.  Performed at [facility], [place] [address], [place],  [place] [address]          Radiology Studies: Chest Portable 1 View  Result Date: 02/26/2021 CLINICAL DATA:  Preoperative assessment for hip surgery EXAM: PORTABLE CHEST 1 VIEW COMPARISON:  June 07, 2019 FINDINGS: The lungs are clear. Heart is borderline enlarged with pulmonary vascularity normal. No adenopathy. There is aortic atherosclerosis. No bone lesions. IMPRESSION: Lungs clear. Borderline cardiac enlargement. Aortic Atherosclerosis (ICD10-I70.0). Electronically Signed   By: Lowella Grip III M.D.   On: 02/26/2021 14:07   DG C-Arm 1-60 Min  Result Date: 02/26/2021 CLINICAL DATA:  Left trochanteric nail. EXAM: OPERATIVE LEFT HIP (WITH PELVIS IF PERFORMED) TECHNIQUE: Fluoroscopic spot image(s) were submitted for interpretation post-operatively. COMPARISON:  Radiograph earlier today. FINDINGS: Six fluoroscopic spot views of the left hip obtained in frontal and lateral projections. Intramedullary nail with distal locking and 2 trans trochanteric screws traverse intertrochanteric femur fracture. Fluoroscopy time 1 minutes 40 seconds. Dose 24.48 mGy. IMPRESSION: Procedural fluoroscopy during intramedullary nail fixation of trochanteric femur fracture. Electronically Signed   By: Keith Rake M.D.   On: 02/26/2021 15:43   DG HIP PORT UNILAT W OR W/O PELVIS 1V LEFT  Result Date: 02/26/2021 CLINICAL DATA:  Postop. EXAM: DG HIP (WITH OR WITHOUT PELVIS) 1V PORT LEFT COMPARISON:  Preoperative radiograph earlier today. FINDINGS: Intramedullary nail with distal locking and trans trochanteric screw fixation of intertrochanteric left femur fracture. Fracture is unchanged in alignment from preoperative imaging, nondisplaced. Recent postsurgical change includes air and edema in the lateral soft tissues. IMPRESSION: ORIF of intertrochanteric left femur fracture. No medial postoperative complication. Electronically Signed   By: Keith Rake M.D.   On: 02/26/2021 17:36   DG HIP OPERATIVE UNILAT W OR W/O  PELVIS LEFT  Result Date: 02/26/2021 CLINICAL DATA:  Left trochanteric nail. EXAM: OPERATIVE LEFT HIP (WITH PELVIS IF PERFORMED) TECHNIQUE: Fluoroscopic spot image(s) were submitted for interpretation post-operatively. COMPARISON:  Radiograph earlier today. FINDINGS: Six fluoroscopic spot views of the left hip obtained in frontal and lateral projections. Intramedullary nail with distal locking and 2 trans trochanteric screws traverse intertrochanteric femur fracture. Fluoroscopy time 1 minutes 40 seconds. Dose 24.48 mGy. IMPRESSION: Procedural fluoroscopy during intramedullary nail fixation of trochanteric femur fracture. Electronically Signed   By: Keith Rake M.D.   On: 02/26/2021 15:43   DG Hip Unilat W or Wo Pelvis 2-3 Views Left  Result Date: 02/26/2021 CLINICAL DATA:  Fall, left hip pain EXAM: DG HIP (WITH OR WITHOUT PELVIS) 2-3V LEFT COMPARISON:  None. FINDINGS: There is a proximal left femoral fracture noted at and just below the intertrochanteric region. Minimal displacement. No angulation. No subluxation or dislocation. IMPRESSION: Left intertrochanteric/proximal femoral shaft fracture. Electronically Signed   By: Rolm Baptise M.D.   On: 02/26/2021 11:23        Scheduled Meds: . celecoxib  100 mg Oral Daily  . chlorhexidine  60 mL Topical Once  . cholecalciferol  1,000 Units Oral BID  . enoxaparin (LOVENOX) injection  40  mg Subcutaneous Q24H  . ferrous sulfate  325 mg Oral Q breakfast  . gabapentin  600 mg Oral TID  . insulin aspart  0-15 Units Subcutaneous TID WC  . levothyroxine  125 mcg Oral QAC breakfast  . loratadine  5 mg Oral Daily  . multivitamin with minerals  1 tablet Oral Daily  . nicotine  21 mg Transdermal Daily  . povidone-iodine  2 application Topical Once  . rosuvastatin  20 mg Oral Daily   Continuous Infusions:   LOS: 1 day    Time spent: 29mins    Kathie Dike, MD [facility]   If 7PM-7AM, please contact  night-coverage [email or web address]  02/27/2021, 7:20 PM

## 2021-02-28 LAB — PREPARE RBC (CROSSMATCH)

## 2021-02-28 LAB — GLUCOSE, CAPILLARY
Glucose-Capillary: 132 mg/dL — ABNORMAL HIGH (ref 70–99)
Glucose-Capillary: 146 mg/dL — ABNORMAL HIGH (ref 70–99)
Glucose-Capillary: 143 mg/dL — ABNORMAL HIGH (ref 70–99)
Glucose-Capillary: 201 mg/dL — ABNORMAL HIGH (ref 70–99)

## 2021-02-28 LAB — CBC
HCT: 23.7 % — ABNORMAL LOW (ref 36.0–46.0)
Hemoglobin: 7.2 g/dL — ABNORMAL LOW (ref 12.0–15.0)
MCH: 22.5 pg — ABNORMAL LOW (ref 26.0–34.0)
MCHC: 30.4 g/dL (ref 30.0–36.0)
MCV: 74.1 fL — ABNORMAL LOW (ref 80.0–100.0)
Platelets: 203 10*3/uL (ref 150–400)
RBC: 3.2 MIL/uL — ABNORMAL LOW (ref 3.87–5.11)
RDW: 18.7 % — ABNORMAL HIGH (ref 11.5–15.5)
WBC: 7.2 10*3/uL (ref 4.0–10.5)
nRBC: 0 % (ref 0.0–0.2)

## 2021-02-28 LAB — BASIC METABOLIC PANEL
Anion gap: 5 (ref 5–15)
BUN: 25 mg/dL — ABNORMAL HIGH (ref 8–23)
CO2: 30 mmol/L (ref 22–32)
Calcium: 8.5 mg/dL — ABNORMAL LOW (ref 8.9–10.3)
Chloride: 101 mmol/L (ref 98–111)
Creatinine, Ser: 0.94 mg/dL (ref 0.44–1.00)
GFR, Estimated: >60 mL/min (ref >60)
Glucose, Bld: 183 mg/dL — ABNORMAL HIGH (ref 70–99)
Potassium: 3.7 mmol/L (ref 3.5–5.1)
Sodium: 136 mmol/L (ref 135–145)

## 2021-02-28 MED ORDER — SODIUM CHLORIDE 0.9% IV SOLUTION: Freq: Once | INTRAVENOUS | Status: AC

## 2021-02-28 MED ORDER — LOSARTAN POTASSIUM 50 MG PO TABS
25.0000 mg | ORAL_TABLET | Freq: Every day | ORAL | Status: DC
  Administered 2021-02-28 – 2021-03-04 (×5): 25 mg via ORAL
  Filled 2021-02-28 (×5): qty 1

## 2021-02-28 MED ORDER — LISINOPRIL 10 MG PO TABS: 10.0000 mg | ORAL_TABLET | Freq: Every day | ORAL | Status: DC

## 2021-02-28 NOTE — NC FL2 (Signed)
[place] LEVEL OF CARE SCREENING TOOL     IDENTIFICATION  Patient Name: Pamela Cherry Birthdate: 28-Feb-1951 Sex: female Admission Date (Current Location): 02/26/2021  [facility] and [place] Number:  Herbalist and Address:  The . [facility], [place] [address] [place], [place] [address]      Provider Number: [id number]  Attending Physician Name and Address:  Kathie Dike, MD  Relative Name and Phone Number:  Lavera Guise, spouse, [phone]    Current Level of Care: Hospital Recommended Level of Care: [place] Prior Approval Number:    Date Approved/Denied:   PASRR Number: [id number]  Discharge Plan: SNF    Current Diagnoses: Patient Active Problem List   Diagnosis Date Noted  . Closed left hip fracture ([place]) 02/26/2021  . Lumbar radiculopathy   . Acquired hypothyroidism 01/13/2016  . Alopecia 01/13/2016  . 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 Essential hypertension 01/13/2016  . Generalized OA 01/13/2016  . Primary insomnia 01/13/2016  . Type 2 diabetes mellitus without complication ([place]) 97/01/6377    Orientation RESPIRATION BLADDER Height & Weight     Self,Time,Situation,Place  Normal Incontinent Weight: 209 lb (94.8 kg) Height:  5\' 4"  (162.6 cm)  BEHAVIORAL SYMPTOMS/MOOD NEUROLOGICAL BOWEL NUTRITION STATUS      Continent Diet (Please see DC Summary)  AMBULATORY STATUS COMMUNICATION OF NEEDS Skin   Limited Assist Verbally Surgical wounds (Closed incision on hip)                       Personal Care Assistance Level of Assistance  Bathing,Feeding,Dressing Bathing Assistance: Limited assistance Feeding assistance: Independent Dressing Assistance: Limited assistance     Functional Limitations Info             SPECIAL CARE FACTORS FREQUENCY  PT (By licensed PT),OT (By licensed OT)     PT Frequency: 5x/week-WBAT LLE OT Frequency: 5x/week             Contractures Contractures Info: Not present    Additional Factors Info  Code Status,Allergies,Insulin Sliding Scale Code Status Info: Full Allergies Info: Other   Insulin Sliding Scale Info: See dc summary       Current Medications (02/28/2021):  This is the current hospital active medication list Current Facility-Administered Medications  Medication Dose Route Frequency Provider Last Rate Last Admin  . acetaminophen (TYLENOL) tablet 325-650 mg  325-650 mg Oral Q6H PRN Delray Alt, PA-C   325 mg at 02/27/21 1004  . celecoxib (CELEBREX) capsule 100 mg  100 mg Oral Daily Patrecia Pace A, PA-C   100 mg at 02/28/21 5885  . chlorhexidine (HIBICLENS) 4 % liquid 4 application  60 mL Topical Once Patrecia Pace A, PA-C      . cholecalciferol (VITAMIN D3) tablet 1,000 Units  1,000 Units Oral BID Delray Alt, PA-C   1,000 Units at 02/28/21 0277  . clorazepate (TRANXENE) tablet 7.5 mg  7.5 mg Oral QHS PRN Patrecia Pace A, PA-C      . enoxaparin (LOVENOX) injection 40 mg  40 mg Subcutaneous Q24H Patrecia Pace A, PA-C   40 mg at 02/28/21 0905  . ferrous sulfate tablet 325 mg  325 mg Oral Q breakfast Delray Alt, PA-C   325 mg at 02/28/21 4128  . gabapentin (NEURONTIN) capsule 600 mg  600 mg Oral TID Patrecia Pace A, PA-C   600 mg at 02/28/21 7867  . HYDROcodone-acetaminophen (NORCO) 7.5-325 MG per  tablet 1-2 tablet  1-2 tablet Oral Q6H PRN Delray Alt, PA-C   2 tablet at 02/28/21 0326  . HYDROcodone-acetaminophen (NORCO/VICODIN) 5-325 MG per tablet 1-2 tablet  1-2 tablet Oral Q6H PRN Delray Alt, PA-C      . HYDROmorphone (DILAUDID) injection 0.5 mg  0.5 mg Intravenous Q3H PRN Delray Alt, PA-C      . hydrOXYzine (ATARAX/VISTARIL) tablet 25 mg  25 mg Oral QHS PRN Patrecia Pace A, PA-C      . insulin aspart (novoLOG) injection 0-15 Units  0-15 Units Subcutaneous TID WC Delray Alt, PA-C   2 Units at 02/28/21 1610  . levothyroxine (SYNTHROID) tablet 125 mcg  125 mcg Oral  QAC breakfast Delray Alt, PA-C   125 mcg at 02/28/21 0546  . loratadine (CLARITIN) tablet 5 mg  5 mg Oral Daily Patrecia Pace A, PA-C   5 mg at 02/28/21 9604  . multivitamin with minerals tablet 1 tablet  1 tablet Oral Daily Kathie Dike, MD   1 tablet at 02/28/21 0905  . nicotine (NICODERM CQ - dosed in mg/24 hours) patch 21 mg  21 mg Transdermal Daily Kathie Dike, MD   21 mg at 02/28/21 0905  . povidone-iodine 10 % swab 2 application  2 application Topical Once Delray Alt, PA-C      . rosuvastatin (CRESTOR) tablet 20 mg  20 mg Oral Daily Patrecia Pace A, PA-C   20 mg at 02/28/21 5409     Discharge Medications: Please see discharge summary for a list of discharge medications.  Relevant Imaging Results:  Relevant Lab Results:   Additional Information SSN: [id number]. [place] COVID-19 Vaccine 10/11/20, 01/29/2020 , 01/08/2020  Benard Halsted, LCSW
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
## 2021-02-28 NOTE — Progress Notes (Signed)
PROGRESS NOTE    Pamela Cherry  BHA:[id number] DOB: January 24, 1951 DOA: 02/26/2021 PCP: Susy Frizzle, MD    Brief Narrative:  70 year old female with a history of diabetes, hyperlipidemia, prior right foot amputation, admitted to the hospital with mechanical fall.  Found to have left hip fracture.  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 [facility])   Essential hypertension   Type 2 diabetes mellitus without complication (HCC)   Closed left hip fracture secondary to mechanical fall -Status post operative repair on 3/17 -Seen by PT/OT with recommendations for SNF placement -Continue pain management  Hypertension -Blood pressures have been elevated -will start on losartan  Diabetes -Chronically on Metformin, pioglitazone and glipizide which were held on admission -Currently on sliding scale insulin -Blood sugars currently stable  Anemia -Hemoglobin of 10.1 on admission which is close to baseline -Postoperative hemoglobin down to 7.2, likely related to intraoperative blood loss as well as hemodilution with IV fluids -transfuse 1 unit prbc -Continue to follow hemoglobin  Hypothyroidism -Continue on Synthroid  Hyperlipidemia -Continue on statin  Tobacco use -Tobacco cessation counseling -Nicotine patch  DVT prophylaxis: enoxaparin (LOVENOX) injection 40 mg Start: 02/27/21 0900 SCDs Start: 02/26/21 1325  Code Status: Full code Family Communication: No family present Disposition Plan: Status is: Inpatient  Remains inpatient appropriate because:Inpatient level of care appropriate due to severity of illness   Dispo: The patient is from: Home              Anticipated d/c is to: SNF              Patient currently is not medically stable to d/c.   Difficult to place patient No   Consultants:   Orthopedics,  Dr. Doreatha Martin  Procedures:  Cephalomedullary nailing of left intertrochanteric femur fracture  Antimicrobials:       Subjective: No shortness of breath. No visible bleeding. Reports pain is controlled  Objective: Vitals:   02/28/21 1241 02/28/21 1256 02/28/21 1400 02/28/21 1437  BP: (!) 150/64 (!) 136/56 (!) 160/64 (!) 144/64  Pulse: 94 95 89 90  Resp: 18 17 18 18   Temp: 97.9 F (36.6 C) 98.5 F (36.9 C) 98.9 F (37.2 C) 98.9 F (37.2 C)  TempSrc: Oral  Oral Oral  SpO2: 94%  93%   Weight:      Height:        Intake/Output Summary (Last 24 hours) at 02/28/2021 1636 Last data filed at 02/28/2021 1437 Gross per 24 hour  Intake 481.67 ml  Output 1000 ml  Net -518.33 ml   Filed Weights   02/26/21 1335  Weight: 94.8 kg    Examination:  General exam: Alert, awake, oriented x 3 Respiratory system: Clear to auscultation. Respiratory effort normal. Cardiovascular system:RRR. No murmurs, rubs, gallops. Gastrointestinal system: Abdomen is nondistended, soft and nontender. No organomegaly or masses felt. Normal bowel sounds heard. Central nervous system: Alert and oriented. No focal neurological deficits. Extremities: No C/C/E, +pedal pulses Skin: No rashes, lesions or ulcers Psychiatry: Judgement and insight appear normal. Mood & affect appropriate.       Data Reviewed: I have personally reviewed following labs and imaging studies  CBC: Recent Labs  Lab 02/26/21 1033 02/27/21 0419 02/28/21 0122  WBC 11.9* 12.6* 7.2  NEUTROABS 10.0*  --   --   HGB 10.1* 8.3* 7.2*  HCT  33.3* 27.9* 23.7*  MCV 73.3* 74.4* 74.1*  PLT 326 304 892   Basic Metabolic Panel: Recent Labs  Lab 02/26/21 1033 02/27/21 0419 02/28/21 0122  NA 138 134* 136  K 3.6 4.6 3.7  CL 105 99 101  CO2 25 27 30   GLUCOSE 187* 254* 183*  BUN 12 17 25*  CREATININE 0.72 0.91 0.94  CALCIUM 9.1 8.6* 8.5*   GFR: Estimated Creatinine Clearance: 61.3 mL/min (by C-G formula based on SCr of 0.94  mg/dL). Liver Function Tests: No results for input(s): AST, ALT, ALKPHOS, BILITOT, PROT, ALBUMIN in the last 168 hours. No results for input(s): LIPASE, AMYLASE in the last 168 hours. No results for input(s): AMMONIA in the last 168 hours. Coagulation Profile: Recent Labs  Lab 02/26/21 1033  INR 1.0   Cardiac Enzymes: No results for input(s): CKTOTAL, CKMB, CKMBINDEX, TROPONINI in the last 168 hours. BNP (last 3 results) No results for input(s): PROBNP in the last 8760 hours. HbA1C: Recent Labs    02/26/21 1033  HGBA1C 6.7*   CBG: Recent Labs  Lab 02/27/21 1257 02/27/21 1643 02/27/21 1948 02/28/21 0656 02/28/21 1106  GLUCAP 192* 152* 231* 132* 146*   Lipid Profile: No results for input(s): CHOL, HDL, LDLCALC, TRIG, CHOLHDL, LDLDIRECT in the last 72 hours. Thyroid Function Tests: No results for input(s): TSH, T4TOTAL, FREET4, T3FREE, THYROIDAB in the last 72 hours. Anemia Panel: No results for input(s): VITAMINB12, FOLATE, FERRITIN, TIBC, IRON, RETICCTPCT in the last 72 hours. Sepsis Labs: No results for input(s): PROCALCITON, LATICACIDVEN in the last 168 hours.  Recent Results (from the past 240 hour(s))  Resp Panel by RT-PCR (Flu A&B, Covid) Nasopharyngeal Swab     Status: None   Collection Time: 02/26/21 11:12 AM   Specimen: Nasopharyngeal Swab; Nasopharyngeal(NP) swabs in vial transport medium  Result Value Ref Range Status   SARS Coronavirus 2 by RT PCR NEGATIVE NEGATIVE Final    Comment: (NOTE) SARS-CoV-2 target nucleic acids are NOT DETECTED.  The SARS-CoV-2 RNA is generally detectable in upper respiratory specimens during the acute phase of infection. The lowest concentration of SARS-CoV-2 viral copies this assay can detect is 138 copies/mL. A negative result does not preclude SARS-Cov-2 infection and should not be used as the sole basis for treatment or other patient management decisions. A negative result may occur with  improper specimen  collection/handling, submission of specimen other than nasopharyngeal swab, presence of viral mutation(s) within the areas targeted by this assay, and inadequate number of viral copies(<138 copies/mL). A negative result must be combined with clinical observations, patient history, and epidemiological information. The expected result is Negative.  Fact Sheet for Patients:  EntrepreneurPulse.com.au  Fact Sheet for Healthcare Providers:  IncredibleEmployment.be  This test is no t yet approved or cleared by the [place] FDA and  has been authorized for detection and/or diagnosis of SARS-CoV-2 by FDA under an Emergency Use Authorization (EUA). This EUA will remain  in effect (meaning this test can be used) for the duration of the COVID-19 declaration under Section 564(b)(1) of the Act, 21 U.S.C.section 360bbb-3(b)(1), unless the authorization is terminated  or revoked sooner.       Influenza A by PCR NEGATIVE NEGATIVE Final   Influenza B by PCR NEGATIVE NEGATIVE Final    Comment: (NOTE) The Xpert Xpress SARS-CoV-2/FLU/RSV plus assay is intended as an aid in the diagnosis of influenza from Nasopharyngeal swab specimens and should not be used as a sole basis for treatment. Nasal washings and aspirates are unacceptable  for Xpert Xpress SARS-CoV-2/FLU/RSV testing.  Fact Sheet for Patients: EntrepreneurPulse.com.au  Fact Sheet for Healthcare Providers: IncredibleEmployment.be  This test is not yet approved or cleared by the [place] FDA and has been authorized for detection and/or diagnosis of SARS-CoV-2 by FDA under an Emergency Use Authorization (EUA). This EUA will remain in effect (meaning this test can be used) for the duration of the COVID-19 declaration under Section 564(b)(1) of the Act, 21 U.S.C. section 360bbb-3(b)(1), unless the authorization is terminated or revoked.  Performed at [facility], [place] [address], [place], [place] [address]          Radiology Studies: No results found.      Scheduled Meds: . celecoxib  100 mg Oral Daily  . chlorhexidine  60 mL Topical Once  . cholecalciferol  1,000 Units Oral BID  . enoxaparin (LOVENOX) injection  40 mg Subcutaneous Q24H  . ferrous sulfate  325 mg Oral Q breakfast  . gabapentin  600 mg Oral TID  . insulin aspart  0-15 Units Subcutaneous TID WC  . levothyroxine  125 mcg Oral QAC breakfast  . loratadine  5 mg Oral Daily  . multivitamin with minerals  1 tablet Oral Daily  . nicotine  21 mg Transdermal Daily  . povidone-iodine  2 application Topical Once  . rosuvastatin  20 mg Oral Daily   Continuous Infusions:   LOS: 2 days    Time spent: 17mins    Kathie Dike, MD [facility]   If 7PM-7AM, please contact night-coverage [email or web address]  02/28/2021, 4:36 PM

## 2021-02-28 NOTE — Progress Notes (Signed)
   ORTHOPAEDIC PROGRESS NOTE  s/p Procedure(s): INTRAMEDULLARY (IM) NAIL INTERTROCHANTRIC  SUBJECTIVE: Reports mild pain about operative site. No chest pain. No SOB. No nausea/vomiting. No other complaints.  OBJECTIVE: PE:  Vitals:   02/27/21 1949 02/28/21 0433  BP: (!) 120/57 118/66  Pulse: 80 76  Resp: 16 17  Temp: 98.9 F (37.2 C) 98.8 F (37.1 C)  SpO2: 94% 95%     ASSESSMENT: Pamela Cherry is a 70 y.o. female doing well postoperatively.  PLAN: Weightbearing: WBAT LLE Insicional and dressing care: Dressings left intact until follow-up Orthopedic device(s): None Showering: not yet VTE prophylaxis: Lovenox 40mg  qd 30 days Pain control: well controlled Follow - up plan: plan on SNF on Monday if she continues to progress slowly Contact information:   Elidia Bonenfant A. Kaleen Mask Physician Assistant [facility] [phone]  02/28/2021, 8:41 AM  Patient ID: Pamela Cherry, female   DOB: 1951/11/03, 70 y.o.   MRN: [id number]

## 2021-03-01 LAB — BASIC METABOLIC PANEL
Anion gap: 8 (ref 5–15)
BUN: 22 mg/dL (ref 8–23)
CO2: 28 mmol/L (ref 22–32)
Calcium: 8.5 mg/dL — ABNORMAL LOW (ref 8.9–10.3)
Chloride: 100 mmol/L (ref 98–111)
Creatinine, Ser: 0.77 mg/dL (ref 0.44–1.00)
GFR, Estimated: >60 mL/min (ref >60)
Glucose, Bld: 236 mg/dL — ABNORMAL HIGH (ref 70–99)
Potassium: 3.7 mmol/L (ref 3.5–5.1)
Sodium: 136 mmol/L (ref 135–145)

## 2021-03-01 LAB — CBC
HCT: 28 % — ABNORMAL LOW (ref 36.0–46.0)
Hemoglobin: 8.4 g/dL — ABNORMAL LOW (ref 12.0–15.0)
MCH: 23.1 pg — ABNORMAL LOW (ref 26.0–34.0)
MCHC: 30 g/dL (ref 30.0–36.0)
MCV: 77.1 fL — ABNORMAL LOW (ref 80.0–100.0)
Platelets: 229 10*3/uL (ref 150–400)
RBC: 3.63 MIL/uL — ABNORMAL LOW (ref 3.87–5.11)
RDW: 19.9 % — ABNORMAL HIGH (ref 11.5–15.5)
WBC: 7.9 10*3/uL (ref 4.0–10.5)
nRBC: 0 % (ref 0.0–0.2)

## 2021-03-01 LAB — TYPE AND SCREEN
ABO/RH(D): A POS
Antibody Screen: NEGATIVE
Unit division: 0

## 2021-03-01 LAB — BPAM RBC
Blood Product Expiration Date: 202204122359
ISSUE DATE / TIME: 202203191233
Unit Type and Rh: 6200

## 2021-03-01 LAB — GLUCOSE, CAPILLARY
Glucose-Capillary: 171 mg/dL — ABNORMAL HIGH (ref 70–99)
Glucose-Capillary: 187 mg/dL — ABNORMAL HIGH (ref 70–99)
Glucose-Capillary: 185 mg/dL — ABNORMAL HIGH (ref 70–99)
Glucose-Capillary: 192 mg/dL — ABNORMAL HIGH (ref 70–99)

## 2021-03-01 MED ORDER — ONDANSETRON HCL 4 MG/2ML IJ SOLN
4.0000 mg | Freq: Four times a day (QID) | INTRAMUSCULAR | Status: DC | PRN
  Administered 2021-03-01: 4 mg via INTRAVENOUS
  Filled 2021-03-01: qty 2

## 2021-03-01 MED ORDER — MAGNESIUM HYDROXIDE 400 MG/5ML PO SUSP
30.0000 mL | ORAL | Status: AC
  Administered 2021-03-01: 30 mL via ORAL
  Filled 2021-03-01: qty 30

## 2021-03-01 NOTE — Progress Notes (Signed)
    Subjective: Patient reports pain as mild to moderate. Tolerating diet. Urinating. No CP, SOB.  Has mobilized OOB with PT/OT. Says she has been doing ankle pumps while in bed to keep her left leg from getting stiff.   Objective:   VITALS:   Vitals:   02/28/21 1400 02/28/21 1437 02/28/21 1923 03/01/21 0300  BP: (!) 160/64 (!) 144/64 (!) 152/58 (!) 136/59  Pulse: 89 90 (!) 107 89  Resp: 18 18 16 17   Temp: 98.9 F (37.2 C) 98.9 F (37.2 C) 98 F (36.7 C) 98.6 F (37 C)  TempSrc: Oral Oral  Oral  SpO2: 93%  92% 94%  Weight:      Height:       CBC Latest Ref Rng & Units 03/01/2021 02/28/2021 02/27/2021  WBC 4.0 - 10.5 K/uL 7.9 7.2 12.6(H)  Hemoglobin 12.0 - 15.0 g/dL 8.4(L) 7.2(L) 8.3(L)  Hematocrit 36.0 - 46.0 % 28.0(L) 23.7(L) 27.9(L)  Platelets 150 - 400 K/uL 229 203 304   BMP Latest Ref Rng & Units 02/28/2021 02/27/2021 02/26/2021  Glucose 70 - 99 mg/dL 183(H) 254(H) 187(H)  BUN 8 - 23 mg/dL 25(H) 17 12  Creatinine 0.44 - 1.00 mg/dL 0.94 0.91 0.72  BUN/Creat Ratio 6 - 22 (calc) - - -  Sodium 135 - 145 mmol/L 136 134(L) 138  Potassium 3.5 - 5.1 mmol/L 3.7 4.6 3.6  Chloride 98 - 111 mmol/L 101 99 105  CO2 22 - 32 mmol/L 30 27 25   Calcium 8.9 - 10.3 mg/dL 8.5(L) 8.6(L) 9.1   Intake/Output      03/19 0701 03/20 0700 03/20 0701 03/21 0700   P.O. 360    Blood 241.7    Total Intake(mL/kg) 601.7 (6.3)    Urine (mL/kg/hr) 675 (0.3)    Total Output 675    Net -73.3         Urine Occurrence 1 x 1 x      Physical Exam: General: NAD. Sitting up in bed.  Resp: No increased wob Cardio: regular rate and rhythm ABD soft Neurologically intact MSK Neurovascularly intact Sensation intact distally Intact pulses distally Dorsiflexion/Plantar flexion intact Incision: dressing C/D/I   Assessment: 3 Days Post-Op  S/P Procedure(s) (LRB): INTRAMEDULLARY (IM) NAIL INTERTROCHANTRIC (Left) by Dr. 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 Suella Grove information for today:  Edmonia Lynch MD, Aggie Moats PA-C  Dispo: PT/OT recommending Skilled Nursing Facility/Rehab. Agree with that. Bed search should be started if not already.    Britt Bottom, PA-C Office [phone] 03/01/2021, 11:06 AM

## 2021-03-01 NOTE — Progress Notes (Signed)
PROGRESS NOTE    Pamela Cherry  IDP:[id number] DOB: 1951/08/04 DOA: 02/26/2021 PCP: Susy Frizzle, MD    Brief Narrative:  70 year old female with a history of diabetes, hyperlipidemia, prior right foot amputation, admitted to the hospital with mechanical fall.  Found to have left hip fracture.  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 [facility])   Essential hypertension   Type 2 diabetes mellitus without complication (HCC)   Closed left hip fracture secondary to mechanical fall -Status post operative repair on 3/17 -Seen by PT/OT with recommendations for SNF placement -Continue pain management  Hypertension -Blood pressures have been elevated -continue on losartan  Diabetes -Chronically on Metformin, pioglitazone and glipizide which were held on admission -Currently on sliding scale insulin -Blood sugars currently stable  Anemia -Hemoglobin of 10.1 on admission which is close to baseline -Postoperative hemoglobin down to 7.2, likely related to intraoperative blood loss as well as hemodilution with IV fluids -transfused 1 unit prbc on 3/19 -follow up hemoglobin 8.4 -Continue to follow hemoglobin  Hypothyroidism -Continue on Synthroid  Hyperlipidemia -Continue on statin  Tobacco use -Tobacco cessation counseling -Nicotine patch  DVT prophylaxis: enoxaparin (LOVENOX) injection 40 mg Start: 02/27/21 0900 SCDs Start: 02/26/21 1325  Code Status: Full code Family Communication: No family present Disposition Plan: Status is: Inpatient  Remains inpatient appropriate because:Inpatient level of care appropriate due to severity of illness   Dispo: The patient is from: Home              Anticipated d/c is to: SNF              Patient currently is not medically stable to d/c.   Difficult to place patient  No   Consultants:   Orthopedics, Dr. Doreatha Martin  Procedures:  Cephalomedullary nailing of left intertrochanteric femur fracture  Antimicrobials:       Subjective: Had some nausea today. No vomiting. Has not had a BM in several days  Objective: Vitals:   02/28/21 1400 02/28/21 1437 02/28/21 1923 03/01/21 0300  BP: (!) 160/64 (!) 144/64 (!) 152/58 (!) 136/59  Pulse: 89 90 (!) 107 89  Resp: 18 18 16 17   Temp: 98.9 F (37.2 C) 98.9 F (37.2 C) 98 F (36.7 C) 98.6 F (37 C)  TempSrc: Oral Oral  Oral  SpO2: 93%  92% 94%  Weight:      Height:        Intake/Output Summary (Last 24 hours) at 03/01/2021 1111 Last data filed at 03/01/2021 4431 Gross per 24 hour  Intake 601.67 ml  Output 675 ml  Net -73.33 ml   Filed Weights   02/26/21 1335  Weight: 94.8 kg    Examination:  General exam: Alert, awake, oriented x 3 Respiratory system: Clear to auscultation. Respiratory effort normal. Cardiovascular system:RRR. No murmurs, rubs, gallops. Gastrointestinal system: Abdomen is nondistended, soft and nontender. No organomegaly or masses felt. Normal bowel sounds heard. Central nervous system: Alert and oriented. No focal neurological deficits. Extremities: No C/C/E, +pedal pulses Skin: No rashes, lesions or ulcers Psychiatry: Judgement and insight appear normal. Mood & affect appropriate.        Data Reviewed: I have personally reviewed following labs and imaging studies  CBC: Recent Labs  Lab 02/26/21 1033 02/27/21 0419 02/28/21 0122 03/01/21 1034  WBC 11.9* 12.6* 7.2 7.9  NEUTROABS 10.0*  --   --   --  HGB 10.1* 8.3* 7.2* 8.4*  HCT 33.3* 27.9* 23.7* 28.0*  MCV 73.3* 74.4* 74.1* 77.1*  PLT 326 304 203 607   Basic Metabolic Panel: Recent Labs  Lab 02/26/21 1033 02/27/21 0419 02/28/21 0122  NA 138 134* 136  K 3.6 4.6 3.7  CL 105 99 101  CO2 25 27 30   GLUCOSE 187* 254* 183*  BUN 12 17 25*  CREATININE 0.72 0.91 0.94  CALCIUM 9.1 8.6* 8.5*    GFR: Estimated Creatinine Clearance: 61.3 mL/min (by C-G formula based on SCr of 0.94 mg/dL). Liver Function Tests: No results for input(s): AST, ALT, ALKPHOS, BILITOT, PROT, ALBUMIN in the last 168 hours. No results for input(s): LIPASE, AMYLASE in the last 168 hours. No results for input(s): AMMONIA in the last 168 hours. Coagulation Profile: Recent Labs  Lab 02/26/21 1033  INR 1.0   Cardiac Enzymes: No results for input(s): CKTOTAL, CKMB, CKMBINDEX, TROPONINI in the last 168 hours. BNP (last 3 results) No results for input(s): PROBNP in the last 8760 hours. HbA1C: No results for input(s): HGBA1C in the last 72 hours. CBG: Recent Labs  Lab 02/28/21 0656 02/28/21 1106 02/28/21 1648 02/28/21 2208 03/01/21 0637  GLUCAP 132* 146* 143* 201* 171*   Lipid Profile: No results for input(s): CHOL, HDL, LDLCALC, TRIG, CHOLHDL, LDLDIRECT in the last 72 hours. Thyroid Function Tests: No results for input(s): TSH, T4TOTAL, FREET4, T3FREE, THYROIDAB in the last 72 hours. Anemia Panel: No results for input(s): VITAMINB12, FOLATE, FERRITIN, TIBC, IRON, RETICCTPCT in the last 72 hours. Sepsis Labs: No results for input(s): PROCALCITON, LATICACIDVEN in the last 168 hours.  Recent Results (from the past 240 hour(s))  Resp Panel by RT-PCR (Flu A&B, Covid) Nasopharyngeal Swab     Status: None   Collection Time: 02/26/21 11:12 AM   Specimen: Nasopharyngeal Swab; Nasopharyngeal(NP) swabs in vial transport medium  Result Value Ref Range Status   SARS Coronavirus 2 by RT PCR NEGATIVE NEGATIVE Final    Comment: (NOTE) SARS-CoV-2 target nucleic acids are NOT DETECTED.  The SARS-CoV-2 RNA is generally detectable in upper respiratory specimens during the acute phase of infection. The lowest concentration of SARS-CoV-2 viral copies this assay can detect is 138 copies/mL. A negative result does not preclude SARS-Cov-2 infection and should not be used as the sole basis for treatment or other  patient management decisions. A negative result may occur with  improper specimen collection/handling, submission of specimen other than nasopharyngeal swab, presence of viral mutation(s) within the areas targeted by this assay, and inadequate number of viral copies(<138 copies/mL). A negative result must be combined with clinical observations, patient history, and epidemiological information. The expected result is Negative.  Fact Sheet for Patients:  EntrepreneurPulse.com.au  Fact Sheet for Healthcare Providers:  IncredibleEmployment.be  This test is no t yet approved or cleared by the [place] FDA and  has been authorized for detection and/or diagnosis of SARS-CoV-2 by FDA under an Emergency Use Authorization (EUA). This EUA will remain  in effect (meaning this test can be used) for the duration of the COVID-19 declaration under Section 564(b)(1) of the Act, 21 U.S.C.section 360bbb-3(b)(1), unless the authorization is terminated  or revoked sooner.       Influenza A by PCR NEGATIVE NEGATIVE Final   Influenza B by PCR NEGATIVE NEGATIVE Final    Comment: (NOTE) The Xpert Xpress SARS-CoV-2/FLU/RSV plus assay is intended as an aid in the diagnosis of influenza from Nasopharyngeal swab specimens and should not be used as a sole basis  for treatment. Nasal washings and aspirates are unacceptable for Xpert Xpress SARS-CoV-2/FLU/RSV testing.  Fact Sheet for Patients: EntrepreneurPulse.com.au  Fact Sheet for Healthcare Providers: IncredibleEmployment.be  This test is not yet approved or cleared by the [place] FDA and has been authorized for detection and/or diagnosis of SARS-CoV-2 by FDA under an Emergency Use Authorization (EUA). This EUA will remain in effect (meaning this test can be used) for the duration of the COVID-19 declaration under Section 564(b)(1) of the Act, 21 U.S.C. section  360bbb-3(b)(1), unless the authorization is terminated or revoked.  Performed at [facility], [place] [address], [place], [place] [address]          Radiology Studies: No results found.      Scheduled Meds:  celecoxib  100 mg Oral Daily   chlorhexidine  60 mL Topical Once   cholecalciferol  1,000 Units Oral BID   enoxaparin (LOVENOX) injection  40 mg Subcutaneous Q24H   ferrous sulfate  325 mg Oral Q breakfast   gabapentin  600 mg Oral TID   insulin aspart  0-15 Units Subcutaneous TID WC   levothyroxine  125 mcg Oral QAC breakfast   loratadine  5 mg Oral Daily   losartan  25 mg Oral Daily   magnesium hydroxide  30 mL Oral NOW   multivitamin with minerals  1 tablet Oral Daily   nicotine  21 mg Transdermal Daily   povidone-iodine  2 application Topical Once   rosuvastatin  20 mg Oral Daily   Continuous Infusions:   LOS: 3 days    Time spent: 97mins    Kathie Dike, MD [facility]   If 7PM-7AM, please contact night-coverage [email or web address]  03/01/2021, 11:11 AM

## 2021-03-02 DIAGNOSIS — E119 Type 2 diabetes mellitus without complications: Secondary | ICD-10-CM

## 2021-03-02 LAB — CBC
HCT: 26.4 % — ABNORMAL LOW (ref 36.0–46.0)
Hemoglobin: 7.7 g/dL — ABNORMAL LOW (ref 12.0–15.0)
MCH: 23.1 pg — ABNORMAL LOW (ref 26.0–34.0)
MCHC: 29.2 g/dL — ABNORMAL LOW (ref 30.0–36.0)
MCV: 79.3 fL — ABNORMAL LOW (ref 80.0–100.0)
Platelets: 221 10*3/uL (ref 150–400)
RBC: 3.33 MIL/uL — ABNORMAL LOW (ref 3.87–5.11)
RDW: 19.9 % — ABNORMAL HIGH (ref 11.5–15.5)
WBC: 6.8 10*3/uL (ref 4.0–10.5)
nRBC: 0 % (ref 0.0–0.2)

## 2021-03-02 LAB — GLUCOSE, CAPILLARY
Glucose-Capillary: 177 mg/dL — ABNORMAL HIGH (ref 70–99)
Glucose-Capillary: 166 mg/dL — ABNORMAL HIGH (ref 70–99)
Glucose-Capillary: 220 mg/dL — ABNORMAL HIGH (ref 70–99)
Glucose-Capillary: 196 mg/dL — ABNORMAL HIGH (ref 70–99)

## 2021-03-02 NOTE — Progress Notes (Signed)
Orthopaedic Trauma Progress Note  SUBJECTIVE: Doing well, pain controlled. Has not required IV pain medication, will d/c Dilaudid. No chest pain. No SOB. No nausea/vomiting. No other complaints. Has been working well with therapies  OBJECTIVE:  Vitals:   03/01/21 2000 03/02/21 0418  BP: 135/64 136/68  Pulse: (!) 102 97  Resp: 18 18  Temp: 98.8 F (37.1 C) 99.3 F (37.4 C)  SpO2: 94% 98%    General: Sitting up in bed, no acute distress Respiratory: No increased work of breathing.  Left lower extremity: Dressings removed, incisions C/D/I.ankle dorsiflexion/planatarflexion intact. Extremity warm. Neurovascularly at baseline  IMAGING: Stable post op imaging.   LABS:  Results for orders placed or performed during the hospital encounter of 02/26/21 (from the past 24 hour(s))  CBC     Status: Abnormal   Collection Time: 03/01/21 10:34 AM  Result Value Ref Range   WBC 7.9 4.0 - 10.5 K/uL   RBC 3.63 (L) 3.87 - 5.11 MIL/uL   Hemoglobin 8.4 (L) 12.0 - 15.0 g/dL   HCT 28.0 (L) 36.0 - 46.0 %   MCV 77.1 (L) 80.0 - 100.0 fL   MCH 23.1 (L) 26.0 - 34.0 pg   MCHC 30.0 30.0 - 36.0 g/dL   RDW 19.9 (H) 11.5 - 15.5 %   Platelets 229 150 - 400 K/uL   nRBC 0.0 0.0 - 0.2 %  Basic metabolic panel     Status: Abnormal   Collection Time: 03/01/21 10:34 AM  Result Value Ref Range   Sodium 136 135 - 145 mmol/L   Potassium 3.7 3.5 - 5.1 mmol/L   Chloride 100 98 - 111 mmol/L   CO2 28 22 - 32 mmol/L   Glucose, Bld 236 (H) 70 - 99 mg/dL   BUN 22 8 - 23 mg/dL   Creatinine, Ser 0.77 0.44 - 1.00 mg/dL   Calcium 8.5 (L) 8.9 - 10.3 mg/dL   GFR, Estimated >60 >60 mL/min   Anion gap 8 5 - 15  Glucose, capillary     Status: Abnormal   Collection Time: 03/01/21 12:32 PM  Result Value Ref Range   Glucose-Capillary 187 (H) 70 - 99 mg/dL  Glucose, capillary     Status: Abnormal   Collection Time: 03/01/21  4:12 PM  Result Value Ref Range   Glucose-Capillary 185 (H) 70 - 99 mg/dL  Glucose, capillary      Status: Abnormal   Collection Time: 03/01/21  8:38 PM  Result Value Ref Range   Glucose-Capillary 192 (H) 70 - 99 mg/dL  CBC     Status: Abnormal   Collection Time: 03/02/21  2:51 AM  Result Value Ref Range   WBC 6.8 4.0 - 10.5 K/uL   RBC 3.33 (L) 3.87 - 5.11 MIL/uL   Hemoglobin 7.7 (L) 12.0 - 15.0 g/dL   HCT 26.4 (L) 36.0 - 46.0 %   MCV 79.3 (L) 80.0 - 100.0 fL   MCH 23.1 (L) 26.0 - 34.0 pg   MCHC 29.2 (L) 30.0 - 36.0 g/dL   RDW 19.9 (H) 11.5 - 15.5 %   Platelets 221 150 - 400 K/uL   nRBC 0.0 0.0 - 0.2 %  Glucose, capillary     Status: Abnormal   Collection Time: 03/02/21  6:32 AM  Result Value Ref Range   Glucose-Capillary 177 (H) 70 - 99 mg/dL    ASSESSMENT: Pamela Cherry is a 70 y.o. female, 4 Days Post-Op s/p  INTRAMEDULLARY NAIL LEFT INTERTROCHANTRIC FEMUR FRACTURE  CV/Blood loss:  Acute blood loss anemia, Hgb 8.3 this morning.   PLAN: Weightbearing: WBAT LLE Incisional and dressing care: Ok to leave open to air  Showering: Okay to begin showering with assistance. Incisions okay to get wet at this point. Orthopedic device(s): None  Pain management:  1. Tylenol 325-650 mg q 6 hours PRN 2. Norco 7.5-326 mg q 6 hours PRN severe pain 3. Norco 5-326 mg q 6 hours PRN moderate pain 4. Neurontin 600 mg TID 5. Dilaudid 0.5 mg q 3 hours PRN 6. Celebrex 100 mg daily VTE prophylaxis: Lovenox, SCDs ID:  Ancef 2gm post op Foley/Lines:  No foley, KVO IVFs Impediments to Fracture Healing: Vitamin D level 16, continue supplementation Dispo: therapies as tolerated. PT/OT recommending SNF. Ok for d/c from ortho standpoint Follow - up plan: 2 weeks after discharge for repeat x-rays and wound check  Contact information:  Katha Hamming MD, Patrecia Pace PA-C. After hours and holidays please check [email or web address] for group call information for [facility]   Delando Satter A. Ricci Barker, PA-C [phone] (office) [email or web address]
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
## 2021-03-02 NOTE — Plan of Care (Signed)

## 2021-03-02 NOTE — Progress Notes (Signed)
PROGRESS NOTE    Pamela Cherry  QIW:[id number] DOB: 01-29-1951 DOA: 02/26/2021 PCP: Susy Frizzle, MD    Brief Narrative:  70 year old female with a history of diabetes, hyperlipidemia, prior right foot amputation, admitted to the hospital with mechanical fall.  Found to have left hip fracture.  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 [facility])   Essential hypertension   Type 2 diabetes mellitus without complication (HCC)   Closed left hip fracture secondary to mechanical fall -Status post operative repair on 3/17 -Seen by PT/OT with recommendations for SNF placement -Continue pain management  Hypertension -Blood pressures have been elevated -continue on losartan  Diabetes -Chronically on Metformin, pioglitazone and glipizide which were held on admission -Currently on sliding scale insulin -Blood sugars currently stable  Anemia -Hemoglobin of 10.1 on admission which is close to baseline -Postoperative hemoglobin down to 7.2, likely related to intraoperative blood loss as well as hemodilution with IV fluids -transfused 1 unit prbc on 3/19 -follow up hemoglobin 8.4 -Continue to follow hemoglobin  Hypothyroidism -Continue on Synthroid  Hyperlipidemia -Continue on statin  Tobacco use -Tobacco cessation counseling -Nicotine patch  DVT prophylaxis: enoxaparin (LOVENOX) injection 40 mg Start: 02/27/21 0900 SCDs Start: 02/26/21 1325  Code Status: Full code Family Communication: No family present Disposition Plan: Status is: Inpatient  Remains inpatient appropriate because:Inpatient level of care appropriate due to severity of illness   Dispo: The patient is from: Home              Anticipated d/c is to: SNF              Patient currently is medically stable to d/c.   Difficult to place patient  No   Consultants:   Orthopedics, Dr. Doreatha Martin  Procedures:  Cephalomedullary nailing of left intertrochanteric femur fracture  Antimicrobials:       Subjective: She had a bowel movement today. No new complaints  Objective: Vitals:   03/01/21 2000 03/02/21 0418 03/02/21 0900 03/02/21 1219  BP: 135/64 136/68 (!) 127/56 (!) 146/60  Pulse: (!) 102 97 91 91  Resp: 18 18 18 18   Temp: 98.8 F (37.1 C) 99.3 F (37.4 C) 98 F (36.7 C)   TempSrc: Oral Oral    SpO2: 94% 98% 93% 92%  Weight:      Height:       No intake or output data in the 24 hours ending 03/02/21 1528 Filed Weights   02/26/21 1335  Weight: 94.8 kg    Examination:  General exam: Alert, awake, oriented x 3 Respiratory system: Clear to auscultation. Respiratory effort normal. Cardiovascular system:RRR. No murmurs, rubs, gallops. Gastrointestinal system: Abdomen is nondistended, soft and nontender. No organomegaly or masses felt. Normal bowel sounds heard. Central nervous system: Alert and oriented. No focal neurological deficits. Extremities: right BKA Skin: No rashes, lesions or ulcers Psychiatry: Judgement and insight appear normal. Mood & affect appropriate.      Data Reviewed: I have personally reviewed following labs and imaging studies  CBC: Recent Labs  Lab 02/26/21 1033 02/27/21 0419 02/28/21 0122 03/01/21 1034 03/02/21 0251  WBC 11.9* 12.6* 7.2 7.9 6.8  NEUTROABS 10.0*  --   --   --   --   HGB 10.1* 8.3* 7.2* 8.4* 7.7*  HCT 33.3* 27.9* 23.7* 28.0* 26.4*  MCV 73.3* 74.4* 74.1* 77.1* 79.3*  PLT 326 304 203 229 176   Basic Metabolic Panel: Recent Labs  Lab 02/26/21 1033 02/27/21 0419 02/28/21 0122 03/01/21 1034  NA 138 134* 136 136  K 3.6 4.6 3.7 3.7  CL 105 99 101 100  CO2 25 27 30 28   GLUCOSE 187* 254* 183* 236*  BUN 12 17 25* 22  CREATININE 0.72 0.91 0.94 0.77  CALCIUM 9.1 8.6* 8.5* 8.5*   GFR: Estimated Creatinine Clearance: 72 mL/min (by C-G formula based on SCr of  0.77 mg/dL). Liver Function Tests: No results for input(s): AST, ALT, ALKPHOS, BILITOT, PROT, ALBUMIN in the last 168 hours. No results for input(s): LIPASE, AMYLASE in the last 168 hours. No results for input(s): AMMONIA in the last 168 hours. Coagulation Profile: Recent Labs  Lab 02/26/21 1033  INR 1.0   Cardiac Enzymes: No results for input(s): CKTOTAL, CKMB, CKMBINDEX, TROPONINI in the last 168 hours. BNP (last 3 results) No results for input(s): PROBNP in the last 8760 hours. HbA1C: No results for input(s): HGBA1C in the last 72 hours. CBG: Recent Labs  Lab 03/01/21 1232 03/01/21 1612 03/01/21 2038 03/02/21 0632 03/02/21 1215  GLUCAP 187* 185* 192* 177* 166*   Lipid Profile: No results for input(s): CHOL, HDL, LDLCALC, TRIG, CHOLHDL, LDLDIRECT in the last 72 hours. Thyroid Function Tests: No results for input(s): TSH, T4TOTAL, FREET4, T3FREE, THYROIDAB in the last 72 hours. Anemia Panel: No results for input(s): VITAMINB12, FOLATE, FERRITIN, TIBC, IRON, RETICCTPCT in the last 72 hours. Sepsis Labs: No results for input(s): PROCALCITON, LATICACIDVEN in the last 168 hours.  Recent Results (from the past 240 hour(s))  Resp Panel by RT-PCR (Flu A&B, Covid) Nasopharyngeal Swab     Status: None   Collection Time: 02/26/21 11:12 AM   Specimen: Nasopharyngeal Swab; Nasopharyngeal(NP) swabs in vial transport medium  Result Value Ref Range Status   SARS Coronavirus 2 by RT PCR NEGATIVE NEGATIVE Final    Comment: (NOTE) SARS-CoV-2 target nucleic acids are NOT DETECTED.  The SARS-CoV-2 RNA is generally detectable in upper respiratory specimens during the acute phase of infection. The lowest concentration of SARS-CoV-2 viral copies this assay can detect is 138 copies/mL. A negative result does not preclude SARS-Cov-2 infection and should not be used as the sole basis for treatment or other patient management decisions. A negative result may occur with  improper specimen  collection/handling, submission of specimen other than nasopharyngeal swab, presence of viral mutation(s) within the areas targeted by this assay, and inadequate number of viral copies(<138 copies/mL). A negative result must be combined with clinical observations, patient history, and epidemiological information. The expected result is Negative.  Fact Sheet for Patients:  EntrepreneurPulse.com.au  Fact Sheet for Healthcare Providers:  IncredibleEmployment.be  This test is no t yet approved or cleared by the [place] FDA and  has been authorized for detection and/or diagnosis of SARS-CoV-2 by FDA under an Emergency Use Authorization (EUA). This EUA will remain  in effect (meaning this test can be used) for the duration of the COVID-19 declaration under Section 564(b)(1) of the Act, 21 U.S.C.section 360bbb-3(b)(1), unless the authorization is terminated  or revoked sooner.       Influenza A by PCR NEGATIVE NEGATIVE Final   Influenza B by PCR NEGATIVE NEGATIVE Final    Comment: (NOTE) The Xpert Xpress SARS-CoV-2/FLU/RSV plus assay is intended as an aid in the diagnosis of influenza from Nasopharyngeal swab specimens and should not be used as a sole basis for treatment. Nasal washings and aspirates are  unacceptable for Xpert Xpress SARS-CoV-2/FLU/RSV testing.  Fact Sheet for Patients: EntrepreneurPulse.com.au  Fact Sheet for Healthcare Providers: IncredibleEmployment.be  This test is not yet approved or cleared by the [place] FDA and has been authorized for detection and/or diagnosis of SARS-CoV-2 by FDA under an Emergency Use Authorization (EUA). This EUA will remain in effect (meaning this test can be used) for the duration of the COVID-19 declaration under Section 564(b)(1) of the Act, 21 U.S.C. section 360bbb-3(b)(1), unless the authorization is terminated or revoked.  Performed at [facility], [place] [address], [place], [place] [address]          Radiology Studies: No results found.      Scheduled Meds: . celecoxib  100 mg Oral Daily  . cholecalciferol  1,000 Units Oral BID  . enoxaparin (LOVENOX) injection  40 mg Subcutaneous Q24H  . ferrous sulfate  325 mg Oral Q breakfast  . gabapentin  600 mg Oral TID  . insulin aspart  0-15 Units Subcutaneous TID WC  . levothyroxine  125 mcg Oral QAC breakfast  . loratadine  5 mg Oral Daily  . losartan  25 mg Oral Daily  . multivitamin with minerals  1 tablet Oral Daily  . nicotine  21 mg Transdermal Daily  . povidone-iodine  2 application Topical Once  . rosuvastatin  20 mg Oral Daily   Continuous Infusions:   LOS: 4 days    Time spent: 71mins    Kathie Dike, MD [facility]   If 7PM-7AM, please contact night-coverage [email or web address]  03/02/2021, 3:28 PM

## 2021-03-02 NOTE — Discharge Instructions (Signed)
Orthopaedic Trauma Service Discharge Instructions   General Discharge Instructions  WEIGHT BEARING STATUS: weightbearing as tolerated  RANGE OF MOTION/ACTIVITY: Ok for hip and knee motion as tolerated  Wound Care: Incisions can be left open to air if there is no drainage. If incision continues to have drainage, follow wound care instructions below. Okay to shower if no drainage from incisions. - Do NOT apply any ointments, solutions or lotions to pin sites or surgical wounds.  These prevent needed drainage and even though solutions like hydrogen peroxide kill bacteria, they also damage cells lining the pin sites that help fight infection.  Applying lotions or ointments can keep the wounds moist and can cause them to breakdown and open up as well. This can increase the risk for infection. When in doubt call the office.  - If any drainage is noted, use foam or other dry dressing  - Once the incision is completely dry and without drainage, it may be left open to air out.  Showering may begin 36-48 hours later.  Cleaning gently with soap and water.  DVT/PE prophylaxis: Lovenox  Diet: as you were eating previously.  Can use over the counter stool softeners and bowel preparations, such as Miralax, to help with bowel movements.  Narcotics can be constipating.  Be sure to drink plenty of fluids  PAIN MEDICATION USE AND EXPECTATIONS  You have likely been given narcotic medications to help control your pain.  After a traumatic event that results in an fracture (broken bone) with or without surgery, it is ok to use narcotic pain medications to help control one's pain.  We understand that everyone responds to pain differently and each individual patient will be evaluated on a regular basis for the continued need for narcotic medications. Ideally, narcotic medication use should last no more than 6-8 weeks (coinciding with fracture healing).   As a patient it is your responsibility as well to monitor  narcotic medication use and report the amount and frequency you use these medications when you come to your office visit.   We would also advise that if you are using narcotic medications, you should take a dose prior to therapy to maximize you participation.  IF YOU ARE ON NARCOTIC MEDICATIONS IT IS NOT PERMISSIBLE TO OPERATE A MOTOR VEHICLE (MOTORCYCLE/CAR/TRUCK/MOPED) OR HEAVY MACHINERY DO NOT MIX NARCOTICS WITH OTHER CNS (CENTRAL NERVOUS SYSTEM) DEPRESSANTS SUCH AS ALCOHOL   STOP SMOKING OR USING NICOTINE PRODUCTS!!!!  As discussed nicotine severely impairs your body's ability to heal surgical and traumatic wounds but also impairs bone healing.  Wounds and bone heal by forming microscopic blood vessels (angiogenesis) and nicotine is a vasoconstrictor (essentially, shrinks blood vessels).  Therefore, if vasoconstriction occurs to these microscopic blood vessels they essentially disappear and are unable to deliver necessary nutrients to the healing tissue.  This is one modifiable factor that you can do to dramatically increase your chances of healing your injury.    (This means no smoking, no nicotine gum, patches, etc)  DO NOT USE NONSTEROIDAL ANTI-INFLAMMATORY DRUGS (NSAID'S)  Using products such as Advil (ibuprofen), Aleve (naproxen), Motrin (ibuprofen) for additional pain control during fracture healing can delay and/or prevent the healing response.  If you would like to take over the counter (OTC) medication, Tylenol (acetaminophen) is ok.  However, some narcotic medications that are given for pain control contain acetaminophen as well. Therefore, you should not exceed more than 4000 mg of tylenol in a day if you do not have liver disease.  Also note that there are may OTC medicines, such as cold medicines and allergy medicines that my contain tylenol as well.  If you have any questions about medications and/or interactions please ask your doctor/PA or your pharmacist.      ICE AND ELEVATE  INJURED/OPERATIVE EXTREMITY  Using ice and elevating the injured extremity above your heart can help with swelling and pain control.  Icing in a pulsatile fashion, such as 20 minutes on and 20 minutes off, can be followed.    Do not place ice directly on skin. Make sure there is a barrier between to skin and the ice pack.    Using frozen items such as frozen peas works well as the conform nicely to the are that needs to be iced.  USE AN ACE WRAP OR TED HOSE FOR SWELLING CONTROL  In addition to icing and elevation, Ace wraps or TED hose are used to help limit and resolve swelling.  It is recommended to use Ace wraps or TED hose until you are informed to stop.    When using Ace Wraps start the wrapping distally (farthest away from the body) and wrap proximally (closer to the body)   Example: If you had surgery on your leg or thing and you do not have a splint on, start the ace wrap at the toes and work your way up to the thigh        If you had surgery on your upper extremity and do not have a splint on, start the ace wrap at your fingers and work your way up to the upper arm    [place]: [phone]   VISIT OUR WEBSITE FOR ADDITIONAL INFORMATION: [email or web address]

## 2021-03-03 ENCOUNTER — Other Ambulatory Visit: Payer: Self-pay

## 2021-03-03 ENCOUNTER — Encounter (HOSPITAL_COMMUNITY): Payer: Self-pay | Admitting: Internal Medicine

## 2021-03-03 LAB — CBC
HCT: 25.8 % — ABNORMAL LOW (ref 36.0–46.0)
Hemoglobin: 7.6 g/dL — ABNORMAL LOW (ref 12.0–15.0)
MCH: 23.2 pg — ABNORMAL LOW (ref 26.0–34.0)
MCHC: 29.5 g/dL — ABNORMAL LOW (ref 30.0–36.0)
MCV: 78.9 fL — ABNORMAL LOW (ref 80.0–100.0)
Platelets: 212 10*3/uL (ref 150–400)
RBC: 3.27 MIL/uL — ABNORMAL LOW (ref 3.87–5.11)
RDW: 20.4 % — ABNORMAL HIGH (ref 11.5–15.5)
WBC: 6.5 10*3/uL (ref 4.0–10.5)
nRBC: 0.3 % — ABNORMAL HIGH (ref 0.0–0.2)

## 2021-03-03 LAB — GLUCOSE, CAPILLARY
Glucose-Capillary: 161 mg/dL — ABNORMAL HIGH (ref 70–99)
Glucose-Capillary: 190 mg/dL — ABNORMAL HIGH (ref 70–99)
Glucose-Capillary: 156 mg/dL — ABNORMAL HIGH (ref 70–99)

## 2021-03-03 MED ORDER — LOSARTAN POTASSIUM 25 MG PO TABS: 25.0000 mg | ORAL_TABLET | Freq: Every day | ORAL | Status: DC

## 2021-03-03 MED ORDER — GABAPENTIN 300 MG PO CAPS: 600.0000 mg | ORAL_CAPSULE | Freq: Three times a day (TID) | ORAL | Status: DC

## 2021-03-03 NOTE — Plan of Care (Signed)

## 2021-03-03 NOTE — TOC Progression Note (Signed)
Transition of Care [facility]) - Progression Note    Patient Details  Name: Pamela Cherry MRN: [id number] Date of Birth: [birth year]  Transition of Care [facility]) CM/SW Contact  Milinda Antis, [place] Phone Number: 03/03/2021, 2:07 PM  Clinical Narrative:    CSW spoke with [facility] at [facility], [phone].  CSW confirmed bed offer and is awaiting insurance authorization.     Expected Discharge Plan: [place] Barriers to Discharge: Insurance Authorization,Continued Medical Work up  Expected Discharge Plan and Services Expected Discharge Plan: [place] In-house Referral: Clinical Social Work   Post Acute Care Choice: [place] Living arrangements for the past 2 months: Single Family Home                                       Social Determinants of Health (SDOH) Interventions    Readmission Risk Interventions No flowsheet data found.

## 2021-03-03 NOTE — Discharge Summary (Signed)
Physician Discharge Summary  Pamela Cherry HER:[id number] DOB: 08-07-1951 DOA: 02/26/2021  PCP: Susy Frizzle, MD  Admit date: 02/26/2021 Discharge date: 03/04/2021  Admitted From: home Disposition:  SNF  Recommendations for Outpatient Follow-up:  1. Follow up with PCP in 1-2 weeks 2. Please obtain BMP/CBC in one week 3. Follow up with Dr. Doreatha Martin in 2 weeks  Discharge Condition:stable CODE STATUS:full code Diet recommendation: heart healthy, carb modified  Brief/Interim Summary: 70 year old female with a history of diabetes, hyperlipidemia, prior right foot amputation, admitted to the hospital with mechanical fall.  Found to have left hip fracture.  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)   Essential hypertension   Type 2 diabetes mellitus without complication ([place])  Closed left hip fracture secondary to mechanical fall -Status post operative repair on 3/17 -Seen by PT/OT with recommendations for SNF placement -Continue pain management -WBAT  Hypertension -Blood pressures were elevated -started on losartan  Diabetes -Chronically on Metformin, pioglitazone and glipizide which were held on admission -Currently on sliding scale insulin -Blood sugars currently stable -resume oral regimen on discharge  Anemia -Hemoglobin of 10.1 on admission which is close to baseline -Postoperative hemoglobin down to 7.2, likely related to intraoperative blood loss as well as hemodilution with IV fluids -transfused 1 unit prbc on 3/19 -follow up hemoglobin 8.4, appears to have stabilized around 7.6 -Continue to follow hemoglobin  Hypothyroidism -Continue on Synthroid  Hyperlipidemia -Continue on statin  Tobacco use -Tobacco cessation counseling -Nicotine patch  Discharge Instructions   Allergies as of  03/03/2021      Reactions   Other Swelling   NICKLE ALLERGY      Medication List    STOP taking these medications   fludrocortisone 0.1 MG tablet Commonly known as: FLORINEF   midodrine 5 MG tablet Commonly known as: PROAMATINE   pravastatin 40 MG tablet Commonly known as: PRAVACHOL     TAKE these medications   aspirin EC 81 MG tablet Take 81 mg by mouth daily.   calcium carbonate 600 MG Tabs tablet Commonly known as: OS-CAL Take 600 mg by mouth daily.   celecoxib 100 MG capsule Commonly known as: CELEBREX Take 100 mg by mouth daily.   clorazepate 7.5 MG tablet Commonly known as: TRANXENE Take 1 tablet (7.5 mg total) by mouth at bedtime as needed for anxiety or sleep.   enoxaparin 40 MG/0.4ML injection Commonly known as: LOVENOX Inject 0.4 mLs (40 mg total) into the skin daily for 28 days.   ferrous sulfate 325 (65 FE) MG tablet Take 1 tablet (325 mg total) by mouth daily with breakfast.   gabapentin 300 MG capsule Commonly known as: NEURONTIN Take 2 capsules (600 mg total) by mouth 3 (three) times daily. What changed: See the new instructions.   glipiZIDE 10 MG 24 hr tablet Commonly known as: GLUCOTROL XL TAKE 1 TABLET (10 MG TOTAL) BY MOUTH DAILY WITH BREAKFAST.   HYDROcodone-acetaminophen 7.5-325 MG tablet Commonly known as: NORCO Take 1 tablet by mouth every 6 (six) hours as needed for severe pain.   hydrOXYzine 25 MG tablet Commonly known as: ATARAX/VISTARIL Take 25 mg by mouth at bedtime as needed for sleep.   levocetirizine 5 MG tablet Commonly known as: XYZAL Take 1 tablet (5 mg total) by mouth daily.   levothyroxine 125 MCG tablet Commonly known as: SYNTHROID Take 1  tablet (125 mcg total) by mouth daily. What changed: when to take this   losartan 25 MG tablet Commonly known as: COZAAR Take 1 tablet (25 mg total) by mouth daily. Start taking on: March 04, 2021   metFORMIN 1000 MG tablet Commonly known as: GLUCOPHAGE Take 1 tablet  (1,000 mg total) by mouth 2 (two) times daily with a meal. What changed: when to take this   Multi-Vitamins Tabs Take 1 tablet by mouth daily.   pioglitazone 30 MG tablet Commonly known as: Actos Take 1 tablet (30 mg total) by mouth daily.   rosuvastatin 20 MG tablet Commonly known as: Crestor Take 1 tablet (20 mg total) by mouth daily.       Contact information for follow-up providers    Haddix, Thomasene Lot, MD. Schedule an appointment as soon as possible for a visit in 2 week(s).   Specialty: Orthopedic Surgery Why: for repeat x-rays and wound check Contact information: [place] [address] [phone]            Contact information for after-discharge care    Destination    [facility] .   Service: Skilled Nursing Contact information: [place] [address] [phone]                 Allergies  Allergen Reactions  . Other Swelling    NICKLE ALLERGY    Consultations:  orthopedics   Procedures/Studies: Chest Portable 1 View  Result Date: 02/26/2021 CLINICAL DATA:  Preoperative assessment for hip surgery EXAM: PORTABLE CHEST 1 VIEW COMPARISON:  June 07, 2019 FINDINGS: The lungs are clear. Heart is borderline enlarged with pulmonary vascularity normal. No adenopathy. There is aortic atherosclerosis. No bone lesions. IMPRESSION: Lungs clear. Borderline cardiac enlargement. Aortic Atherosclerosis (ICD10-I70.0). Electronically Signed   By: Lowella Grip III M.D.   On: 02/26/2021 14:07   DG C-Arm 1-60 Min  Result Date: 02/26/2021 CLINICAL DATA:  Left trochanteric nail. EXAM: OPERATIVE LEFT HIP (WITH PELVIS IF PERFORMED) TECHNIQUE: Fluoroscopic spot image(s) were submitted for interpretation post-operatively. COMPARISON:  Radiograph earlier today. FINDINGS: Six fluoroscopic spot views of the left hip obtained in frontal and lateral projections. Intramedullary nail with distal locking and 2  trans trochanteric screws traverse intertrochanteric femur fracture. Fluoroscopy time 1 minutes 40 seconds. Dose 24.48 mGy. IMPRESSION: Procedural fluoroscopy during intramedullary nail fixation of trochanteric femur fracture. Electronically Signed   By: Keith Rake M.D.   On: 02/26/2021 15:43   DG HIP PORT UNILAT W OR W/O PELVIS 1V LEFT  Result Date: 02/26/2021 CLINICAL DATA:  Postop. EXAM: DG HIP (WITH OR WITHOUT PELVIS) 1V PORT LEFT COMPARISON:  Preoperative radiograph earlier today. FINDINGS: Intramedullary nail with distal locking and trans trochanteric screw fixation of intertrochanteric left femur fracture. Fracture is unchanged in alignment from preoperative imaging, nondisplaced. Recent postsurgical change includes air and edema in the lateral soft tissues. IMPRESSION: ORIF of intertrochanteric left femur fracture. No medial postoperative complication. Electronically Signed   By: Keith Rake M.D.   On: 02/26/2021 17:36   DG HIP OPERATIVE UNILAT W OR W/O PELVIS LEFT  Result Date: 02/26/2021 CLINICAL DATA:  Left trochanteric nail. EXAM: OPERATIVE LEFT HIP (WITH PELVIS IF PERFORMED) TECHNIQUE: Fluoroscopic spot image(s) were submitted for interpretation post-operatively. COMPARISON:  Radiograph earlier today. FINDINGS: Six fluoroscopic spot views of the left hip obtained in frontal and lateral projections. Intramedullary nail with distal locking and 2 trans trochanteric screws traverse intertrochanteric femur fracture. Fluoroscopy time 1 minutes  40 seconds. Dose 24.48 mGy. IMPRESSION: Procedural fluoroscopy during intramedullary nail fixation of trochanteric femur fracture. Electronically Signed   By: Keith Rake M.D.   On: 02/26/2021 15:43   DG Hip Unilat W or Wo Pelvis 2-3 Views Left  Result Date: 02/26/2021 CLINICAL DATA:  Fall, left hip pain EXAM: DG HIP (WITH OR WITHOUT PELVIS) 2-3V LEFT COMPARISON:  None. FINDINGS: There is a proximal left femoral fracture noted at and just  below the intertrochanteric region. Minimal displacement. No angulation. No subluxation or dislocation. IMPRESSION: Left intertrochanteric/proximal femoral shaft fracture. Electronically Signed   By: Rolm Baptise M.D.   On: 02/26/2021 11:23       Subjective: Pain is reasonably controlled, no new complaints  Discharge Exam: Vitals:   03/02/21 2033 03/03/21 0400 03/03/21 0807 03/03/21 1520  BP: (!) 142/52 (!) 122/58 (!) 119/58 130/66  Pulse: 92 83 76 88  Resp: 16 16 16 16   Temp: 98.6 F (37 C) 98.5 F (36.9 C) 98.4 F (36.9 C) 97.9 F (36.6 C)  TempSrc: Oral Oral Oral Oral  SpO2: 93% 92% 93% 95%  Weight:      Height:        General: Pt is alert, awake, not in acute distress Cardiovascular: RRR, S1/S2 +, no rubs, no gallops Respiratory: CTA bilaterally, no wheezing, no rhonchi Abdominal: Soft, NT, ND, bowel sounds + Extremities: RLE BKA    The results of significant diagnostics from this hospitalization (including imaging, microbiology, ancillary and laboratory) are listed below for reference.     Microbiology: Recent Results (from the past 240 hour(s))  Resp Panel by RT-PCR (Flu A&B, Covid) Nasopharyngeal Swab     Status: None   Collection Time: 02/26/21 11:12 AM   Specimen: Nasopharyngeal Swab; Nasopharyngeal(NP) swabs in vial transport medium  Result Value Ref Range Status   SARS Coronavirus 2 by RT PCR NEGATIVE NEGATIVE Final    Comment: (NOTE) SARS-CoV-2 target nucleic acids are NOT DETECTED.  The SARS-CoV-2 RNA is generally detectable in upper respiratory specimens during the acute phase of infection. The lowest concentration of SARS-CoV-2 viral copies this assay can detect is 138 copies/mL. A negative result does not preclude SARS-Cov-2 infection and should not be used as the sole basis for treatment or other patient management decisions. A negative result may occur with  improper specimen collection/handling, submission of specimen other than nasopharyngeal  swab, presence of viral mutation(s) within the areas targeted by this assay, and inadequate number of viral copies(<138 copies/mL). A negative result must be combined with clinical observations, patient history, and epidemiological information. The expected result is Negative.  Fact Sheet for Patients:  EntrepreneurPulse.com.au  Fact Sheet for Healthcare Providers:  IncredibleEmployment.be  This test is no t yet approved or cleared by the [place] FDA and  has been authorized for detection and/or diagnosis of SARS-CoV-2 by FDA under an Emergency Use Authorization (EUA). This EUA will remain  in effect (meaning this test can be used) for the duration of the COVID-19 declaration under Section 564(b)(1) of the Act, 21 U.S.C.section 360bbb-3(b)(1), unless the authorization is terminated  or revoked sooner.       Influenza A by PCR NEGATIVE NEGATIVE Final   Influenza B by PCR NEGATIVE NEGATIVE Final    Comment: (NOTE) The Xpert Xpress SARS-CoV-2/FLU/RSV plus assay is intended as an aid in the diagnosis of influenza from Nasopharyngeal swab specimens and should not be used as a sole basis for treatment. Nasal washings and aspirates are unacceptable for Xpert Xpress SARS-CoV-2/FLU/RSV  testing.  Fact Sheet for Patients: EntrepreneurPulse.com.au  Fact Sheet for Healthcare Providers: IncredibleEmployment.be  This test is not yet approved or cleared by the [place] FDA and has been authorized for detection and/or diagnosis of SARS-CoV-2 by FDA under an Emergency Use Authorization (EUA). This EUA will remain in effect (meaning this test can be used) for the duration of the COVID-19 declaration under Section 564(b)(1) of the Act, 21 U.S.C. section 360bbb-3(b)(1), unless the authorization is terminated or revoked.  Performed at [facility], [place] [address] [place], [place] [address]       Labs: BNP (last 3 results) No results for input(s): BNP in the last 8760 hours. Basic Metabolic Panel: Recent Labs  Lab 02/26/21 1033 02/27/21 0419 02/28/21 0122 03/01/21 1034  NA 138 134* 136 136  K 3.6 4.6 3.7 3.7  CL 105 99 101 100  CO2 25 27 30 28   GLUCOSE 187* 254* 183* 236*  BUN 12 17 25* 22  CREATININE 0.72 0.91 0.94 0.77  CALCIUM 9.1 8.6* 8.5* 8.5*   Liver Function Tests: No results for input(s): AST, ALT, ALKPHOS, BILITOT, PROT, ALBUMIN in the last 168 hours. No results for input(s): LIPASE, AMYLASE in the last 168 hours. No results for input(s): AMMONIA in the last 168 hours. CBC: Recent Labs  Lab 02/26/21 1033 02/27/21 0419 02/28/21 0122 03/01/21 1034 03/02/21 0251 03/03/21 0632  WBC 11.9* 12.6* 7.2 7.9 6.8 6.5  NEUTROABS 10.0*  --   --   --   --   --   HGB 10.1* 8.3* 7.2* 8.4* 7.7* 7.6*  HCT 33.3* 27.9* 23.7* 28.0* 26.4* 25.8*  MCV 73.3* 74.4* 74.1* 77.1* 79.3* 78.9*  PLT 326 304 203 229 221 212   Cardiac Enzymes: No results for input(s): CKTOTAL, CKMB, CKMBINDEX, TROPONINI in the last 168 hours. BNP: Invalid input(s): POCBNP CBG: Recent Labs  Lab 03/02/21 1215 03/02/21 1559 03/02/21 2031 03/03/21 0610 03/03/21 1205  GLUCAP 166* 220* 196* 161* 190*   D-Dimer No results for input(s): DDIMER in the last 72 hours. Hgb A1c No results for input(s): HGBA1C in the last 72 hours. Lipid Profile No results for input(s): CHOL, HDL, LDLCALC, TRIG, CHOLHDL, LDLDIRECT in the last 72 hours. Thyroid function studies No results for input(s): TSH, T4TOTAL, T3FREE, THYROIDAB in the last 72 hours.  Invalid input(s): FREET3 Anemia work up No results for input(s): VITAMINB12, FOLATE, FERRITIN, TIBC, IRON, RETICCTPCT in the last 72 hours. Urinalysis    Component Value Date/Time   COLORURINE YELLOW 09/04/2017 1714   APPEARANCEUR CLEAR 09/04/2017 1714   LABSPEC <1.005 (L) 09/04/2017 1714   PHURINE 6.5 09/04/2017 1714   GLUCOSEU NEGATIVE 09/04/2017 1714    HGBUR TRACE (A) 09/04/2017 1714   BILIRUBINUR NEGATIVE 09/04/2017 1714   KETONESUR NEGATIVE 09/04/2017 1714   PROTEINUR NEGATIVE 09/04/2017 1714   NITRITE POSITIVE (A) 09/04/2017 1714   LEUKOCYTESUR SMALL (A) 09/04/2017 1714   Sepsis Labs Invalid input(s): PROCALCITONIN,  WBC,  LACTICIDVEN Microbiology Recent Results (from the past 240 hour(s))  Resp Panel by RT-PCR (Flu A&B, Covid) Nasopharyngeal Swab     Status: None   Collection Time: 02/26/21 11:12 AM   Specimen: Nasopharyngeal Swab; Nasopharyngeal(NP) swabs in vial transport medium  Result Value Ref Range Status   SARS Coronavirus 2 by RT PCR NEGATIVE NEGATIVE Final    Comment: (NOTE) SARS-CoV-2 target nucleic acids are NOT DETECTED.  The SARS-CoV-2 RNA is generally detectable in upper respiratory specimens during the acute phase of infection. The lowest concentration of  SARS-CoV-2 viral copies this assay can detect is 138 copies/mL. A negative result does not preclude SARS-Cov-2 infection and should not be used as the sole basis for treatment or other patient management decisions. A negative result may occur with  improper specimen collection/handling, submission of specimen other than nasopharyngeal swab, presence of viral mutation(s) within the areas targeted by this assay, and inadequate number of viral copies(<138 copies/mL). A negative result must be combined with clinical observations, patient history, and epidemiological information. The expected result is Negative.  Fact Sheet for Patients:  EntrepreneurPulse.com.au  Fact Sheet for Healthcare Providers:  IncredibleEmployment.be  This test is no t yet approved or cleared by the [place] FDA and  has been authorized for detection and/or diagnosis of SARS-CoV-2 by FDA under an Emergency Use Authorization (EUA). This EUA will remain  in effect (meaning this test can be used) for the duration of the COVID-19 declaration  under Section 564(b)(1) of the Act, 21 U.S.C.section 360bbb-3(b)(1), unless the authorization is terminated  or revoked sooner.       Influenza A by PCR NEGATIVE NEGATIVE Final   Influenza B by PCR NEGATIVE NEGATIVE Final    Comment: (NOTE) The Xpert Xpress SARS-CoV-2/FLU/RSV plus assay is intended as an aid in the diagnosis of influenza from Nasopharyngeal swab specimens and should not be used as a sole basis for treatment. Nasal washings and aspirates are unacceptable for Xpert Xpress SARS-CoV-2/FLU/RSV testing.  Fact Sheet for Patients: EntrepreneurPulse.com.au  Fact Sheet for Healthcare Providers: IncredibleEmployment.be  This test is not yet approved or cleared by the [place] FDA and has been authorized for detection and/or diagnosis of SARS-CoV-2 by FDA under an Emergency Use Authorization (EUA). This EUA will remain in effect (meaning this test can be used) for the duration of the COVID-19 declaration under Section 564(b)(1) of the Act, 21 U.S.C. section 360bbb-3(b)(1), unless the authorization is terminated or revoked.  Performed at [facility], [place] [address], [place], [place] [address]      Time coordinating discharge: 61mins  SIGNED:   Kathie Dike, MD  [facility] 03/03/2021, 3:36 PM   If 7PM-7AM, please contact night-coverage [email or web address]

## 2021-03-03 NOTE — TOC Initial Note (Signed)
Transition of Care [facility]) - Initial/Assessment Note    Patient Details  Name: Pamela Cherry MRN: [id number] Date of Birth: 11-12-1951  [facility]) CM/SW Contact:    Milinda Antis, [place] Phone Number: 03/03/2021, 1:23 PM  Clinical Narrative:                 CSW received consult for possible SNF placement at time of discharge. CSW spoke with patient. Patient reported that patient's spouse is currently unable to care for patient at their home given patient's current physical needs and fall risk. Patient expressed understanding of PT recommendation and is agreeable to SNF placement at time of discharge. Patient does not have a facility preference, but reports that she does not want to go to [place] discussed insurance authorization process and provided Medicare SNF ratings list. Patient has received the COVID vaccines. Patient expressed being hopeful for rehab and to feel better soon. No further questions reported at this time.   Expected Discharge Plan: Skilled Nursing Facility Barriers to Discharge: Insurance Authorization,Continued Medical Work up   Patient Goals and CMS Choice Patient states their goals for this hospitalization and ongoing recovery are:: Participate in [facility] at [facility] and return home CMS Medicare.gov Compare Post Acute Care list provided to:: Patient Choice offered to / list presented to : Patient  Expected Discharge Plan and Services Expected Discharge Plan: [place] In-house Referral: Clinical Social Work   Post Acute Care Choice: [place] Living arrangements for the past 2 months: [place]                                      Prior Living Arrangements/Services Living arrangements for the past 2 months: Single Family Home Lives with:: Spouse   Do you feel safe going back to the place where you live?: Yes        Care giver support system in place?: Yes (comment)   Criminal  Activity/Legal Involvement Pertinent to Current Situation/Hospitalization: No - Comment as needed  Activities of Daily Living Home Assistive Devices/Equipment: Engineer, drilling (specify type) (prostetic leg) ADL Screening (condition at time of admission) Patient's cognitive ability adequate to safely complete daily activities?: Yes Is the patient deaf or have difficulty hearing?: No Does the patient have difficulty seeing, even when wearing glasses/contacts?: No Does the patient have difficulty concentrating, remembering, or making decisions?: No Patient able to express need for assistance with ADLs?: Yes Does the patient have difficulty dressing or bathing?: No Independently performs ADLs?: Yes (appropriate for developmental age) Does the patient have difficulty walking or climbing stairs?: Yes Weakness of Legs: Right Weakness of Arms/Hands: None  Permission Sought/Granted Permission sought to share information with : Other (comment) (SNF) Permission granted to share information with : Yes, Verbal Permission Granted     Permission granted to share info w AGENCY: [facility]        Emotional Assessment Appearance:: Appears stated age Attitude/Demeanor/Rapport: Engaged Affect (typically observed): Appropriate Orientation: : Oriented to Self,Oriented to Place,Oriented to  Time,Oriented to Situation   Psych Involvement: No (comment)  Admission diagnosis:  Preoperative examination [Z01.818] Closed left hip fracture ([place]) [S72.002A] Intertrochanteric fracture of left femur, closed, initial encounter [facility]) [S72.142A] Patient Active Problem List   Diagnosis Date Noted  . Closed left hip fracture ([place]) 02/26/2021  . Lumbar radiculopathy   . Acquired hypothyroidism 01/13/2016  . Alopecia 01/13/2016  .  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 Essential hypertension 01/13/2016  . Generalized OA 01/13/2016  . Primary insomnia 01/13/2016  . Type  2 diabetes mellitus without complication ([place]) 03/49/6116   PCP:  Susy Frizzle, MD Pharmacy:   [id number] - [place], [place] [address] Phone: [phone] Fax: [phone]     Social Determinants of Health (SDOH) Interventions    Readmission Risk Interventions No flowsheet data found.

## 2021-03-03 NOTE — Care Management Important Message (Signed)
Important Message  Patient Details  Name: Pamela Cherry MRN: [id number] Date of Birth: 12-06-1951   Medicare Important Message Given:  Yes     [place] [address] 03/03/2021, 4:09 PM
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
## 2021-03-04 DIAGNOSIS — S72002D Fracture of unspecified part of neck of left femur, subsequent encounter for closed fracture with routine healing: Secondary | ICD-10-CM

## 2021-03-04 LAB — RESP PANEL BY RT-PCR (FLU A&B, COVID) ARPGX2
Influenza A by PCR: NEGATIVE
Influenza B by PCR: NEGATIVE
SARS Coronavirus 2 by RT PCR: NEGATIVE

## 2021-03-04 LAB — GLUCOSE, CAPILLARY
Glucose-Capillary: 163 mg/dL — ABNORMAL HIGH (ref 70–99)
Glucose-Capillary: 180 mg/dL — ABNORMAL HIGH (ref 70–99)
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
## 2021-03-04 NOTE — Progress Notes (Signed)
   03/04/21 1410  AVS Discharge Documentation  AVS Discharge Instructions Including Medications Provided to patient/caregiver  Name of Person Receiving AVS Discharge Instructions Including Medications Pamela Cherry  Name of Clinician That Reviewed AVS Discharge Instructions Including Medications Gregary Signs RN   Patient discharged to home. Writer reviewed discharge instructions with patient and patient verbalized understanding to teaching. Patient left via wheelchair with all patient belongings. PIV removed, tolerated well.

## 2021-03-04 NOTE — Progress Notes (Signed)
Pamela Cherry  TZG:[id number] DOB: Oct 23, 1951 DOA: 02/26/2021 PCP: Susy Frizzle, MD    Brief Narrative:  70 year old female with a history of diabetes, hyperlipidemia, prior right foot amputation, admitted to the hospital with mechanical fall. Found to have left hip fracture. Admitted for operative management   Subjective: Patient was cleared for discharge to SNF yesterday by the previous attending physician who knew her well.  Her stay was extended another night while arrangements were being made.  Today on reevaluation by PT and OT it was felt that the patient had progressed to the point that she was now safe for home health physical therapy instead of SNF.  The patient was highly motivated to go home instead of SNF.  She insisted however that she had all the support that she needed at home and would not allow [place] to arrange home health PT OT.  As the patient had previously been medically cleared and no acute issues had developed otherwise she was allowed to be discharged home before she was evaluated by this MD.  Assessment & Plan:  Closed left hip fracture secondary to mechanical fall -Status post operative repair on 3/17 -Seen by PT/OT with recommendations for SNF placement changed to home w/ therapy -WBAT  Hypertension -Blood pressures were elevated -started on losartan this admit   Diabetes -Chronically on Metformin, pioglitazone and glipizide which were held on admission -Currently on sliding scale insulin -Blood sugars currently stable -resume oral regimen on discharge  Anemia -Hemoglobin of 10.1 on admission which is close to baseline -Postoperative hemoglobin down to 7.2, likely related to intraoperative blood loss as well as hemodilution with IV fluids -transfused 1 unit prbc on 3/19 -follow up hemoglobin 8.4, appears to have stabilized around 7.6  Hypothyroidism -Continue Synthroid  Hyperlipidemia -Continue statin  Tobacco use -Tobacco cessation  counseled -Nicotine patch   Code Status: FULL CODE Family Communication:   Objective: Blood pressure (!) 152/70, pulse 87, temperature 98.1 F (36.7 C), temperature source Oral, resp. rate 17, height 5\' 4"  (1.626 m), weight 94.8 kg, SpO2 93 %.  Intake/Output Summary (Last 24 hours) at 03/04/2021 1046 Last data filed at 03/03/2021 2100 Gross per 24 hour  Intake -  Output 800 ml  Net -800 ml   Filed Weights   02/26/21 1335  Weight: 94.8 kg    Examination: No exam - chart reviewed   CBC: Recent Labs  Lab 02/26/21 1033 02/27/21 0419 03/01/21 1034 03/02/21 0251 03/03/21 0632  WBC 11.9*   < > 7.9 6.8 6.5  NEUTROABS 10.0*  --   --   --   --   HGB 10.1*   < > 8.4* 7.7* 7.6*  HCT 33.3*   < > 28.0* 26.4* 25.8*  MCV 73.3*   < > 77.1* 79.3* 78.9*  PLT 326   < > 229 221 212   < > = values in this interval not displayed.   Basic Metabolic Panel: Recent Labs  Lab 02/27/21 0419 02/28/21 0122 03/01/21 1034  NA 134* 136 136  K 4.6 3.7 3.7  CL 99 101 100  CO2 27 30 28   GLUCOSE 254* 183* 236*  BUN 17 25* 22  CREATININE 0.91 0.94 0.77  CALCIUM 8.6* 8.5* 8.5*    Coagulation Profile: Recent Labs  Lab 02/26/21 1033  INR 1.0     HbA1C: Hgb A1c MFr Bld  Date/Time Value Ref Range Status  02/26/2021 10:33 AM 6.7 (H) 4.8 - 5.6 % Final    Comment:    (  NOTE) Pre diabetes:          5.7%-6.4%  Diabetes:              >6.4%  Glycemic control for   <7.0% adults with diabetes   10/05/2019 08:14 AM 6.7 (H) <5.7 % of total Hgb Final    Comment:    For someone without known diabetes, a hemoglobin A1c value of 6.5% or greater indicates that they may have  diabetes and this should be confirmed with a follow-up  test. . For someone with known diabetes, a value <7% indicates  that their diabetes is well controlled and a value  greater than or equal to 7% indicates suboptimal  control. A1c targets should be individualized based on  duration of diabetes, age, comorbid  conditions, and  other considerations. . Currently, no consensus exists regarding use of hemoglobin A1c for diagnosis of diabetes for children. .     CBG: Recent Labs  Lab 03/02/21 2031 03/03/21 0610 03/03/21 1205 03/03/21 1615 03/04/21 0745  GLUCAP 196* 161* 190* 156* 163*    Recent Results (from the past 240 hour(s))  Resp Panel by RT-PCR (Flu A&B, Covid) Nasopharyngeal Swab     Status: None   Collection Time: 02/26/21 11:12 AM   Specimen: Nasopharyngeal Swab; Nasopharyngeal(NP) swabs in vial transport medium  Result Value Ref Range Status   SARS Coronavirus 2 by RT PCR NEGATIVE NEGATIVE Final    Comment: (NOTE) SARS-CoV-2 target nucleic acids are NOT DETECTED.  The SARS-CoV-2 RNA is generally detectable in upper respiratory specimens during the acute phase of infection. The lowest concentration of SARS-CoV-2 viral copies this assay can detect is 138 copies/mL. A negative result does not preclude SARS-Cov-2 infection and should not be used as the sole basis for treatment or other patient management decisions. A negative result may occur with  improper specimen collection/handling, submission of specimen other than nasopharyngeal swab, presence of viral mutation(s) within the areas targeted by this assay, and inadequate number of viral copies(<138 copies/mL). A negative result must be combined with clinical observations, patient history, and epidemiological information. The expected result is Negative.  Fact Sheet for Patients:  EntrepreneurPulse.com.au  Fact Sheet for Healthcare Providers:  IncredibleEmployment.be  This test is no t yet approved or cleared by the [place] FDA and  has been authorized for detection and/or diagnosis of SARS-CoV-2 by FDA under an Emergency Use Authorization (EUA). This EUA will remain  in effect (meaning this test can be used) for the duration of the COVID-19 declaration under Section  564(b)(1) of the Act, 21 U.S.C.section 360bbb-3(b)(1), unless the authorization is terminated  or revoked sooner.       Influenza A by PCR NEGATIVE NEGATIVE Final   Influenza B by PCR NEGATIVE NEGATIVE Final    Comment: (NOTE) The Xpert Xpress SARS-CoV-2/FLU/RSV plus assay is intended as an aid in the diagnosis of influenza from Nasopharyngeal swab specimens and should not be used as a sole basis for treatment. Nasal washings and aspirates are unacceptable for Xpert Xpress SARS-CoV-2/FLU/RSV testing.  Fact Sheet for Patients: EntrepreneurPulse.com.au  Fact Sheet for Healthcare Providers: IncredibleEmployment.be  This test is not yet approved or cleared by the [place] FDA and has been authorized for detection and/or diagnosis of SARS-CoV-2 by FDA under an Emergency Use Authorization (EUA). This EUA will remain in effect (meaning this test can be used) for the duration of the COVID-19 declaration under Section 564(b)(1) of the Act, 21 U.S.C. section 360bbb-3(b)(1), unless the authorization is terminated  or revoked.  Performed at [facility], [place] [address] [place], [place] [address]      Scheduled Meds: . celecoxib  100 mg Oral Daily  . cholecalciferol  1,000 Units Oral BID  . enoxaparin (LOVENOX) injection  40 mg Subcutaneous Q24H  . ferrous sulfate  325 mg Oral Q breakfast  . gabapentin  600 mg Oral TID  . insulin aspart  0-15 Units Subcutaneous TID WC  . levothyroxine  125 mcg Oral QAC breakfast  . loratadine  5 mg Oral Daily  . losartan  25 mg Oral Daily  . multivitamin with minerals  1 tablet Oral Daily  . nicotine  21 mg Transdermal Daily  . povidone-iodine  2 application Topical Once  . rosuvastatin  20 mg Oral Daily     LOS: 6 days   Cherene Altes, MD [facility] Office  [phone] Pager - Text Page per Amion  If 7PM-7AM, please contact night-coverage per Amion 03/04/2021, 10:46 AM
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
## 2021-03-04 NOTE — TOC Transition Note (Signed)
Transition of Care [facility]) - CM/SW Discharge Note   Patient Details  Name: Pamela Cherry MRN: [id number] Date of Birth: Feb 21, 1951  Transition of Care [facility]) CM/SW Contact:  Milinda Antis, [place] Phone Number: 03/04/2021, 12:48 PM   Clinical Narrative:    Patient will DC to: Home Anticipated DC date: 03/04/2021 Family notified: Yes Transport by: Family   Per MD patient ready for DC home.  Patient and patient's family notified of DC.  Patient was offered home health services. Patient declined home health services.  Patient reports that she will be able to manage at home without services.  Patient reports that her husband (per patient report her husband is in a wheel chair, but can walk and can assist the patient), sister Pamela Cherry), and niece Pamela Cherry) can assist if needed.    OT and PT recommend a 3 in 1 and a rolling walker.  Patient reports that she has a rolling walker at home and declined the 3 in 1.  Patient reports that her bathroom is small and she can hold on to the "sink" and "tub" to ambulate or call her husband for assistance if needed.       CSW will sign off for now as social work intervention is no longer needed. Please consult [place] again if new needs arise.     Final next level of care: Home/Self Care Barriers to Discharge: Barriers Resolved   Patient Goals and CMS Choice Patient states their goals for this hospitalization and ongoing recovery are:: Patient now wants to return home to be with her husband as she has progressed so well during hospitalization CMS Medicare.gov Compare Post Acute Care list provided to:: Patient Choice offered to / list presented to : Patient  Discharge Placement                       Discharge Plan and Services In-house Referral: Clinical Social Work   Post Acute Care Choice: [place] Arranged: Refused [facility]          Social Determinants of Health (SDOH) Interventions      Readmission Risk Interventions No flowsheet data found.

## 2021-03-04 NOTE — Plan of Care (Signed)

## 2021-03-14 DIAGNOSIS — D649 Anemia, unspecified: Secondary | ICD-10-CM | POA: Diagnosis not present

## 2021-03-14 DIAGNOSIS — I119 Hypertensive heart disease without heart failure: Secondary | ICD-10-CM | POA: Diagnosis not present

## 2021-03-14 DIAGNOSIS — S72142D Displaced intertrochanteric fracture of left femur, subsequent encounter for closed fracture with routine healing: Secondary | ICD-10-CM | POA: Diagnosis not present

## 2021-03-14 DIAGNOSIS — M19011 Primary osteoarthritis, right shoulder: Secondary | ICD-10-CM | POA: Diagnosis not present

## 2021-03-14 DIAGNOSIS — E785 Hyperlipidemia, unspecified: Secondary | ICD-10-CM | POA: Diagnosis not present

## 2021-03-14 DIAGNOSIS — K5900 Constipation, unspecified: Secondary | ICD-10-CM | POA: Diagnosis not present

## 2021-03-14 DIAGNOSIS — E119 Type 2 diabetes mellitus without complications: Secondary | ICD-10-CM | POA: Diagnosis not present

## 2021-03-14 DIAGNOSIS — I700 Atherosclerosis of aorta: Secondary | ICD-10-CM | POA: Diagnosis not present

## 2021-03-14 DIAGNOSIS — M1710 Unilateral primary osteoarthritis, unspecified knee: Secondary | ICD-10-CM | POA: Diagnosis not present

## 2021-03-16 DIAGNOSIS — M19011 Primary osteoarthritis, right shoulder: Secondary | ICD-10-CM | POA: Diagnosis not present

## 2021-03-16 DIAGNOSIS — I700 Atherosclerosis of aorta: Secondary | ICD-10-CM | POA: Diagnosis not present

## 2021-03-16 DIAGNOSIS — E119 Type 2 diabetes mellitus without complications: Secondary | ICD-10-CM | POA: Diagnosis not present

## 2021-03-16 DIAGNOSIS — I119 Hypertensive heart disease without heart failure: Secondary | ICD-10-CM | POA: Diagnosis not present

## 2021-03-16 DIAGNOSIS — S72142D Displaced intertrochanteric fracture of left femur, subsequent encounter for closed fracture with routine healing: Secondary | ICD-10-CM | POA: Diagnosis not present

## 2021-03-16 DIAGNOSIS — M1710 Unilateral primary osteoarthritis, unspecified knee: Secondary | ICD-10-CM | POA: Diagnosis not present

## 2021-03-16 DIAGNOSIS — D649 Anemia, unspecified: Secondary | ICD-10-CM | POA: Diagnosis not present

## 2021-03-16 DIAGNOSIS — E785 Hyperlipidemia, unspecified: Secondary | ICD-10-CM | POA: Diagnosis not present

## 2021-03-16 DIAGNOSIS — K5900 Constipation, unspecified: Secondary | ICD-10-CM | POA: Diagnosis not present

## 2021-03-18 DIAGNOSIS — S72142D Displaced intertrochanteric fracture of left femur, subsequent encounter for closed fracture with routine healing: Secondary | ICD-10-CM | POA: Diagnosis not present

## 2021-03-18 DIAGNOSIS — M19011 Primary osteoarthritis, right shoulder: Secondary | ICD-10-CM | POA: Diagnosis not present

## 2021-03-18 DIAGNOSIS — M1710 Unilateral primary osteoarthritis, unspecified knee: Secondary | ICD-10-CM | POA: Diagnosis not present

## 2021-03-18 DIAGNOSIS — D649 Anemia, unspecified: Secondary | ICD-10-CM | POA: Diagnosis not present

## 2021-03-18 DIAGNOSIS — E785 Hyperlipidemia, unspecified: Secondary | ICD-10-CM | POA: Diagnosis not present

## 2021-03-18 DIAGNOSIS — I700 Atherosclerosis of aorta: Secondary | ICD-10-CM | POA: Diagnosis not present

## 2021-03-18 DIAGNOSIS — I119 Hypertensive heart disease without heart failure: Secondary | ICD-10-CM | POA: Diagnosis not present

## 2021-03-18 DIAGNOSIS — E119 Type 2 diabetes mellitus without complications: Secondary | ICD-10-CM | POA: Diagnosis not present

## 2021-03-18 DIAGNOSIS — K5900 Constipation, unspecified: Secondary | ICD-10-CM | POA: Diagnosis not present

## 2021-03-19 DIAGNOSIS — K5900 Constipation, unspecified: Secondary | ICD-10-CM | POA: Diagnosis not present

## 2021-03-19 DIAGNOSIS — M1710 Unilateral primary osteoarthritis, unspecified knee: Secondary | ICD-10-CM | POA: Diagnosis not present

## 2021-03-19 DIAGNOSIS — S72142D Displaced intertrochanteric fracture of left femur, subsequent encounter for closed fracture with routine healing: Secondary | ICD-10-CM | POA: Diagnosis not present

## 2021-03-19 DIAGNOSIS — M19011 Primary osteoarthritis, right shoulder: Secondary | ICD-10-CM | POA: Diagnosis not present

## 2021-03-19 DIAGNOSIS — E785 Hyperlipidemia, unspecified: Secondary | ICD-10-CM | POA: Diagnosis not present

## 2021-03-19 DIAGNOSIS — E119 Type 2 diabetes mellitus without complications: Secondary | ICD-10-CM | POA: Diagnosis not present

## 2021-03-19 DIAGNOSIS — I700 Atherosclerosis of aorta: Secondary | ICD-10-CM | POA: Diagnosis not present

## 2021-03-19 DIAGNOSIS — D649 Anemia, unspecified: Secondary | ICD-10-CM | POA: Diagnosis not present

## 2021-03-19 DIAGNOSIS — I119 Hypertensive heart disease without heart failure: Secondary | ICD-10-CM | POA: Diagnosis not present

## 2021-03-23 DIAGNOSIS — E785 Hyperlipidemia, unspecified: Secondary | ICD-10-CM | POA: Diagnosis not present

## 2021-03-23 DIAGNOSIS — E119 Type 2 diabetes mellitus without complications: Secondary | ICD-10-CM | POA: Diagnosis not present

## 2021-03-23 DIAGNOSIS — M19011 Primary osteoarthritis, right shoulder: Secondary | ICD-10-CM | POA: Diagnosis not present

## 2021-03-23 DIAGNOSIS — K5900 Constipation, unspecified: Secondary | ICD-10-CM | POA: Diagnosis not present

## 2021-03-23 DIAGNOSIS — D649 Anemia, unspecified: Secondary | ICD-10-CM | POA: Diagnosis not present

## 2021-03-23 DIAGNOSIS — M1710 Unilateral primary osteoarthritis, unspecified knee: Secondary | ICD-10-CM | POA: Diagnosis not present

## 2021-03-23 DIAGNOSIS — S72142D Displaced intertrochanteric fracture of left femur, subsequent encounter for closed fracture with routine healing: Secondary | ICD-10-CM | POA: Diagnosis not present

## 2021-03-23 DIAGNOSIS — I119 Hypertensive heart disease without heart failure: Secondary | ICD-10-CM | POA: Diagnosis not present

## 2021-03-23 DIAGNOSIS — I700 Atherosclerosis of aorta: Secondary | ICD-10-CM | POA: Diagnosis not present

## 2021-03-25 DIAGNOSIS — E785 Hyperlipidemia, unspecified: Secondary | ICD-10-CM | POA: Diagnosis not present

## 2021-03-25 DIAGNOSIS — D649 Anemia, unspecified: Secondary | ICD-10-CM | POA: Diagnosis not present

## 2021-03-25 DIAGNOSIS — E119 Type 2 diabetes mellitus without complications: Secondary | ICD-10-CM | POA: Diagnosis not present

## 2021-03-25 DIAGNOSIS — K5900 Constipation, unspecified: Secondary | ICD-10-CM | POA: Diagnosis not present

## 2021-03-25 DIAGNOSIS — M19011 Primary osteoarthritis, right shoulder: Secondary | ICD-10-CM | POA: Diagnosis not present

## 2021-03-25 DIAGNOSIS — S72142D Displaced intertrochanteric fracture of left femur, subsequent encounter for closed fracture with routine healing: Secondary | ICD-10-CM | POA: Diagnosis not present

## 2021-03-25 DIAGNOSIS — I119 Hypertensive heart disease without heart failure: Secondary | ICD-10-CM | POA: Diagnosis not present

## 2021-03-25 DIAGNOSIS — M1710 Unilateral primary osteoarthritis, unspecified knee: Secondary | ICD-10-CM | POA: Diagnosis not present

## 2021-03-25 DIAGNOSIS — I700 Atherosclerosis of aorta: Secondary | ICD-10-CM | POA: Diagnosis not present

## 2021-03-26 DIAGNOSIS — E119 Type 2 diabetes mellitus without complications: Secondary | ICD-10-CM | POA: Diagnosis not present

## 2021-03-26 DIAGNOSIS — M1710 Unilateral primary osteoarthritis, unspecified knee: Secondary | ICD-10-CM | POA: Diagnosis not present

## 2021-03-26 DIAGNOSIS — K5900 Constipation, unspecified: Secondary | ICD-10-CM | POA: Diagnosis not present

## 2021-03-26 DIAGNOSIS — E785 Hyperlipidemia, unspecified: Secondary | ICD-10-CM | POA: Diagnosis not present

## 2021-03-26 DIAGNOSIS — I700 Atherosclerosis of aorta: Secondary | ICD-10-CM | POA: Diagnosis not present

## 2021-03-26 DIAGNOSIS — D649 Anemia, unspecified: Secondary | ICD-10-CM | POA: Diagnosis not present

## 2021-03-26 DIAGNOSIS — S72142D Displaced intertrochanteric fracture of left femur, subsequent encounter for closed fracture with routine healing: Secondary | ICD-10-CM | POA: Diagnosis not present

## 2021-03-26 DIAGNOSIS — I119 Hypertensive heart disease without heart failure: Secondary | ICD-10-CM | POA: Diagnosis not present

## 2021-03-26 DIAGNOSIS — M19011 Primary osteoarthritis, right shoulder: Secondary | ICD-10-CM | POA: Diagnosis not present

## 2021-03-31 DIAGNOSIS — S72142D Displaced intertrochanteric fracture of left femur, subsequent encounter for closed fracture with routine healing: Secondary | ICD-10-CM | POA: Diagnosis not present

## 2021-04-01 DIAGNOSIS — M19011 Primary osteoarthritis, right shoulder: Secondary | ICD-10-CM | POA: Diagnosis not present

## 2021-04-01 DIAGNOSIS — M1710 Unilateral primary osteoarthritis, unspecified knee: Secondary | ICD-10-CM | POA: Diagnosis not present

## 2021-04-01 DIAGNOSIS — K5900 Constipation, unspecified: Secondary | ICD-10-CM | POA: Diagnosis not present

## 2021-04-01 DIAGNOSIS — I119 Hypertensive heart disease without heart failure: Secondary | ICD-10-CM | POA: Diagnosis not present

## 2021-04-01 DIAGNOSIS — E785 Hyperlipidemia, unspecified: Secondary | ICD-10-CM | POA: Diagnosis not present

## 2021-04-01 DIAGNOSIS — S72142D Displaced intertrochanteric fracture of left femur, subsequent encounter for closed fracture with routine healing: Secondary | ICD-10-CM | POA: Diagnosis not present

## 2021-04-01 DIAGNOSIS — I700 Atherosclerosis of aorta: Secondary | ICD-10-CM | POA: Diagnosis not present

## 2021-04-01 DIAGNOSIS — D649 Anemia, unspecified: Secondary | ICD-10-CM | POA: Diagnosis not present

## 2021-04-01 DIAGNOSIS — E119 Type 2 diabetes mellitus without complications: Secondary | ICD-10-CM | POA: Diagnosis not present

## 2021-04-03 DIAGNOSIS — M1710 Unilateral primary osteoarthritis, unspecified knee: Secondary | ICD-10-CM | POA: Diagnosis not present

## 2021-04-03 DIAGNOSIS — D649 Anemia, unspecified: Secondary | ICD-10-CM | POA: Diagnosis not present

## 2021-04-03 DIAGNOSIS — E119 Type 2 diabetes mellitus without complications: Secondary | ICD-10-CM | POA: Diagnosis not present

## 2021-04-03 DIAGNOSIS — I119 Hypertensive heart disease without heart failure: Secondary | ICD-10-CM | POA: Diagnosis not present

## 2021-04-03 DIAGNOSIS — E785 Hyperlipidemia, unspecified: Secondary | ICD-10-CM | POA: Diagnosis not present

## 2021-04-03 DIAGNOSIS — K5900 Constipation, unspecified: Secondary | ICD-10-CM | POA: Diagnosis not present

## 2021-04-03 DIAGNOSIS — S72142D Displaced intertrochanteric fracture of left femur, subsequent encounter for closed fracture with routine healing: Secondary | ICD-10-CM | POA: Diagnosis not present

## 2021-04-03 DIAGNOSIS — M19011 Primary osteoarthritis, right shoulder: Secondary | ICD-10-CM | POA: Diagnosis not present

## 2021-04-03 DIAGNOSIS — I700 Atherosclerosis of aorta: Secondary | ICD-10-CM | POA: Diagnosis not present

## 2021-04-06 DIAGNOSIS — I700 Atherosclerosis of aorta: Secondary | ICD-10-CM | POA: Diagnosis not present

## 2021-04-06 DIAGNOSIS — S72142D Displaced intertrochanteric fracture of left femur, subsequent encounter for closed fracture with routine healing: Secondary | ICD-10-CM | POA: Diagnosis not present

## 2021-04-06 DIAGNOSIS — M19011 Primary osteoarthritis, right shoulder: Secondary | ICD-10-CM | POA: Diagnosis not present

## 2021-04-06 DIAGNOSIS — I119 Hypertensive heart disease without heart failure: Secondary | ICD-10-CM | POA: Diagnosis not present

## 2021-04-06 DIAGNOSIS — M1710 Unilateral primary osteoarthritis, unspecified knee: Secondary | ICD-10-CM | POA: Diagnosis not present

## 2021-04-06 DIAGNOSIS — E785 Hyperlipidemia, unspecified: Secondary | ICD-10-CM | POA: Diagnosis not present

## 2021-04-06 DIAGNOSIS — E119 Type 2 diabetes mellitus without complications: Secondary | ICD-10-CM | POA: Diagnosis not present

## 2021-04-06 DIAGNOSIS — D649 Anemia, unspecified: Secondary | ICD-10-CM | POA: Diagnosis not present

## 2021-04-06 DIAGNOSIS — K5900 Constipation, unspecified: Secondary | ICD-10-CM | POA: Diagnosis not present

## 2021-04-08 DIAGNOSIS — K5900 Constipation, unspecified: Secondary | ICD-10-CM | POA: Diagnosis not present

## 2021-04-08 DIAGNOSIS — M1710 Unilateral primary osteoarthritis, unspecified knee: Secondary | ICD-10-CM | POA: Diagnosis not present

## 2021-04-08 DIAGNOSIS — I700 Atherosclerosis of aorta: Secondary | ICD-10-CM | POA: Diagnosis not present

## 2021-04-08 DIAGNOSIS — M19011 Primary osteoarthritis, right shoulder: Secondary | ICD-10-CM | POA: Diagnosis not present

## 2021-04-08 DIAGNOSIS — D649 Anemia, unspecified: Secondary | ICD-10-CM | POA: Diagnosis not present

## 2021-04-08 DIAGNOSIS — E785 Hyperlipidemia, unspecified: Secondary | ICD-10-CM | POA: Diagnosis not present

## 2021-04-08 DIAGNOSIS — I119 Hypertensive heart disease without heart failure: Secondary | ICD-10-CM | POA: Diagnosis not present

## 2021-04-08 DIAGNOSIS — S72142D Displaced intertrochanteric fracture of left femur, subsequent encounter for closed fracture with routine healing: Secondary | ICD-10-CM | POA: Diagnosis not present

## 2021-04-08 DIAGNOSIS — E119 Type 2 diabetes mellitus without complications: Secondary | ICD-10-CM | POA: Diagnosis not present

## 2021-04-13 DIAGNOSIS — S72142D Displaced intertrochanteric fracture of left femur, subsequent encounter for closed fracture with routine healing: Secondary | ICD-10-CM | POA: Diagnosis not present

## 2021-04-13 DIAGNOSIS — E119 Type 2 diabetes mellitus without complications: Secondary | ICD-10-CM | POA: Diagnosis not present

## 2021-04-13 DIAGNOSIS — E785 Hyperlipidemia, unspecified: Secondary | ICD-10-CM | POA: Diagnosis not present

## 2021-04-13 DIAGNOSIS — I700 Atherosclerosis of aorta: Secondary | ICD-10-CM | POA: Diagnosis not present

## 2021-04-13 DIAGNOSIS — K5900 Constipation, unspecified: Secondary | ICD-10-CM | POA: Diagnosis not present

## 2021-04-13 DIAGNOSIS — M1710 Unilateral primary osteoarthritis, unspecified knee: Secondary | ICD-10-CM | POA: Diagnosis not present

## 2021-04-13 DIAGNOSIS — I119 Hypertensive heart disease without heart failure: Secondary | ICD-10-CM | POA: Diagnosis not present

## 2021-04-13 DIAGNOSIS — D649 Anemia, unspecified: Secondary | ICD-10-CM | POA: Diagnosis not present

## 2021-04-13 DIAGNOSIS — M19011 Primary osteoarthritis, right shoulder: Secondary | ICD-10-CM | POA: Diagnosis not present

## 2021-04-20 DIAGNOSIS — S72142D Displaced intertrochanteric fracture of left femur, subsequent encounter for closed fracture with routine healing: Secondary | ICD-10-CM | POA: Diagnosis not present

## 2021-04-20 DIAGNOSIS — M1710 Unilateral primary osteoarthritis, unspecified knee: Secondary | ICD-10-CM | POA: Diagnosis not present

## 2021-04-20 DIAGNOSIS — I119 Hypertensive heart disease without heart failure: Secondary | ICD-10-CM | POA: Diagnosis not present

## 2021-04-20 DIAGNOSIS — M19011 Primary osteoarthritis, right shoulder: Secondary | ICD-10-CM | POA: Diagnosis not present

## 2021-04-20 DIAGNOSIS — E785 Hyperlipidemia, unspecified: Secondary | ICD-10-CM | POA: Diagnosis not present

## 2021-04-20 DIAGNOSIS — D649 Anemia, unspecified: Secondary | ICD-10-CM | POA: Diagnosis not present

## 2021-04-20 DIAGNOSIS — E119 Type 2 diabetes mellitus without complications: Secondary | ICD-10-CM | POA: Diagnosis not present

## 2021-04-20 DIAGNOSIS — K5900 Constipation, unspecified: Secondary | ICD-10-CM | POA: Diagnosis not present

## 2021-04-20 DIAGNOSIS — I700 Atherosclerosis of aorta: Secondary | ICD-10-CM | POA: Diagnosis not present

## 2021-04-21 DIAGNOSIS — S72142A Displaced intertrochanteric fracture of left femur, initial encounter for closed fracture: Secondary | ICD-10-CM | POA: Diagnosis not present

## 2021-04-27 DIAGNOSIS — M1710 Unilateral primary osteoarthritis, unspecified knee: Secondary | ICD-10-CM | POA: Diagnosis not present

## 2021-04-27 DIAGNOSIS — M19011 Primary osteoarthritis, right shoulder: Secondary | ICD-10-CM | POA: Diagnosis not present

## 2021-04-27 DIAGNOSIS — I119 Hypertensive heart disease without heart failure: Secondary | ICD-10-CM | POA: Diagnosis not present

## 2021-04-27 DIAGNOSIS — E119 Type 2 diabetes mellitus without complications: Secondary | ICD-10-CM | POA: Diagnosis not present

## 2021-04-27 DIAGNOSIS — K5900 Constipation, unspecified: Secondary | ICD-10-CM | POA: Diagnosis not present

## 2021-04-27 DIAGNOSIS — D649 Anemia, unspecified: Secondary | ICD-10-CM | POA: Diagnosis not present

## 2021-04-27 DIAGNOSIS — E785 Hyperlipidemia, unspecified: Secondary | ICD-10-CM | POA: Diagnosis not present

## 2021-04-27 DIAGNOSIS — S72142D Displaced intertrochanteric fracture of left femur, subsequent encounter for closed fracture with routine healing: Secondary | ICD-10-CM | POA: Diagnosis not present

## 2021-04-27 DIAGNOSIS — I700 Atherosclerosis of aorta: Secondary | ICD-10-CM | POA: Diagnosis not present

## 2021-05-06 DIAGNOSIS — E785 Hyperlipidemia, unspecified: Secondary | ICD-10-CM | POA: Diagnosis not present

## 2021-05-06 DIAGNOSIS — S72142D Displaced intertrochanteric fracture of left femur, subsequent encounter for closed fracture with routine healing: Secondary | ICD-10-CM | POA: Diagnosis not present

## 2021-05-06 DIAGNOSIS — M19011 Primary osteoarthritis, right shoulder: Secondary | ICD-10-CM | POA: Diagnosis not present

## 2021-05-06 DIAGNOSIS — I119 Hypertensive heart disease without heart failure: Secondary | ICD-10-CM | POA: Diagnosis not present

## 2021-05-06 DIAGNOSIS — M1710 Unilateral primary osteoarthritis, unspecified knee: Secondary | ICD-10-CM | POA: Diagnosis not present

## 2021-05-06 DIAGNOSIS — D649 Anemia, unspecified: Secondary | ICD-10-CM | POA: Diagnosis not present

## 2021-05-06 DIAGNOSIS — E119 Type 2 diabetes mellitus without complications: Secondary | ICD-10-CM | POA: Diagnosis not present

## 2021-05-06 DIAGNOSIS — I700 Atherosclerosis of aorta: Secondary | ICD-10-CM | POA: Diagnosis not present

## 2021-05-06 DIAGNOSIS — K5900 Constipation, unspecified: Secondary | ICD-10-CM | POA: Diagnosis not present

## 2021-05-12 DIAGNOSIS — S72142D Displaced intertrochanteric fracture of left femur, subsequent encounter for closed fracture with routine healing: Secondary | ICD-10-CM | POA: Diagnosis not present

## 2021-07-20 ENCOUNTER — Other Ambulatory Visit: Payer: Self-pay | Admitting: *Deleted

## 2021-07-20 DIAGNOSIS — S72002D Fracture of unspecified part of neck of left femur, subsequent encounter for closed fracture with routine healing: Secondary | ICD-10-CM

## 2021-07-30 ENCOUNTER — Ambulatory Visit: Payer: Medicare HMO | Admitting: Family Medicine

## 2021-08-06 ENCOUNTER — Ambulatory Visit: Payer: Medicare HMO | Admitting: Family Medicine

## 2021-08-10 DIAGNOSIS — S72142D Displaced intertrochanteric fracture of left femur, subsequent encounter for closed fracture with routine healing: Secondary | ICD-10-CM | POA: Diagnosis not present

## 2021-08-28 ENCOUNTER — Other Ambulatory Visit: Payer: Self-pay | Admitting: *Deleted

## 2021-08-28 ENCOUNTER — Other Ambulatory Visit: Payer: Self-pay | Admitting: Student

## 2021-08-28 ENCOUNTER — Telehealth: Payer: Self-pay | Admitting: Family Medicine

## 2021-08-28 DIAGNOSIS — S72142D Displaced intertrochanteric fracture of left femur, subsequent encounter for closed fracture with routine healing: Secondary | ICD-10-CM

## 2021-08-28 MED ORDER — GLIPIZIDE ER 10 MG PO TB24
10.0000 mg | ORAL_TABLET | Freq: Every day | ORAL | 0 refills | Status: AC
Start: 2021-08-28 — End: ?

## 2021-08-28 MED ORDER — FERROUS SULFATE 325 (65 FE) MG PO TABS: 325.0000 mg | ORAL_TABLET | Freq: Every day | ORAL | 0 refills | Status: DC

## 2021-08-28 MED ORDER — GABAPENTIN 300 MG PO CAPS: 600.0000 mg | ORAL_CAPSULE | Freq: Three times a day (TID) | ORAL | 0 refills | Status: DC

## 2021-08-28 MED ORDER — METFORMIN HCL 1000 MG PO TABS: 1000.0000 mg | ORAL_TABLET | Freq: Two times a day (BID) | ORAL | 0 refills | Status: DC

## 2021-08-28 MED ORDER — HYDROXYZINE HCL 25 MG PO TABS: 25.0000 mg | ORAL_TABLET | Freq: Every evening | ORAL | 0 refills | Status: DC | PRN

## 2021-08-28 MED ORDER — PIOGLITAZONE HCL 30 MG PO TABS: 30.0000 mg | ORAL_TABLET | Freq: Every day | ORAL | 0 refills | Status: AC

## 2021-08-28 NOTE — Telephone Encounter (Signed)
Patient called to follow up on refill requests for gabapentin (NEURONTIN) 300 MG capsule [id number]  metFORMIN (GLUCOPHAGE) 1000 MG tablet [id number]  glipiZIDE (GLUCOTROL XL) 10 MG 24 hr tablet [id number]  ferrous sulfate 325 (65 FE) MG tablet [id number]  hydrOXYzine (ATARAX/VISTARIL) 25 MG tablet [id number]    Pharmacy verified as   [facility] - [address]  [place], [place] [address]  Phone:  [phone]  Fax:  [phone]   Please advise at [phone]

## 2021-08-28 NOTE — Telephone Encounter (Signed)
Medication filled x1 with no refills.   Requires office visit before any further refills can be given.   Letter sent.  

## 2021-09-03 ENCOUNTER — Encounter: Payer: Self-pay | Admitting: Family Medicine

## 2021-09-03 ENCOUNTER — Other Ambulatory Visit: Payer: Self-pay

## 2021-09-03 ENCOUNTER — Telehealth: Payer: Self-pay | Admitting: Family Medicine

## 2021-09-03 ENCOUNTER — Ambulatory Visit (INDEPENDENT_AMBULATORY_CARE_PROVIDER_SITE_OTHER): Payer: Medicare HMO | Admitting: Family Medicine

## 2021-09-03 VITALS — BP 142/84 | HR 80 | Temp 98.9°F | Resp 14 | Ht 64.0 in | Wt 218.0 lb

## 2021-09-03 DIAGNOSIS — I10 Essential (primary) hypertension: Secondary | ICD-10-CM

## 2021-09-03 DIAGNOSIS — Z23 Encounter for immunization: Secondary | ICD-10-CM

## 2021-09-03 DIAGNOSIS — E039 Hypothyroidism, unspecified: Secondary | ICD-10-CM | POA: Diagnosis not present

## 2021-09-03 DIAGNOSIS — R6889 Other general symptoms and signs: Secondary | ICD-10-CM | POA: Diagnosis not present

## 2021-09-03 DIAGNOSIS — E119 Type 2 diabetes mellitus without complications: Secondary | ICD-10-CM | POA: Diagnosis not present

## 2021-09-03 DIAGNOSIS — D649 Anemia, unspecified: Secondary | ICD-10-CM | POA: Diagnosis not present

## 2021-09-03 MED ORDER — METFORMIN HCL 1000 MG PO TABS: 1000.0000 mg | ORAL_TABLET | Freq: Two times a day (BID) | ORAL | 1 refills | Status: DC

## 2021-09-03 MED ORDER — GABAPENTIN 300 MG PO CAPS: 300.0000 mg | ORAL_CAPSULE | Freq: Three times a day (TID) | ORAL | 1 refills | Status: DC

## 2021-09-03 MED ORDER — HYDROXYZINE HCL 25 MG PO TABS: 25.0000 mg | ORAL_TABLET | Freq: Every evening | ORAL | 1 refills | Status: DC | PRN
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
## 2021-09-03 NOTE — Telephone Encounter (Signed)
Patient called to confirm receipt of medication list she faxed. Patient stated provider may adjust or change olmesartan; called to follow up and find out what changes will be made. List placed in hanging folder at [place] desk.  Please advise at [phone].

## 2021-09-03 NOTE — Telephone Encounter (Signed)
Medication list updated.   Patient states that she is not taking Entresto. She is taking Olmesartan 20mg .

## 2021-09-04 ENCOUNTER — Other Ambulatory Visit: Payer: Self-pay | Admitting: *Deleted

## 2021-09-04 DIAGNOSIS — D649 Anemia, unspecified: Secondary | ICD-10-CM

## 2021-09-04 MED ORDER — OLMESARTAN MEDOXOMIL 40 MG PO TABS: 40.0000 mg | ORAL_TABLET | Freq: Every day | ORAL | 1 refills | Status: DC

## 2021-09-04 NOTE — Telephone Encounter (Signed)
Call placed to patient and patient made aware.   Prescription sent to pharmacy.  

## 2021-09-08 LAB — TEST AUTHORIZATION

## 2021-09-08 LAB — LIPID PANEL
Cholesterol: 139 mg/dL (ref <200)
HDL: 36 mg/dL — ABNORMAL LOW (ref >=50)
LDL Cholesterol (Calc): 83 mg/dL (calc)
Non-HDL Cholesterol (Calc): 103 mg/dL (calc) (ref <130)
Total CHOL/HDL Ratio: 3.9 (calc) (ref <5.0)
Triglycerides: 103 mg/dL (ref <150)

## 2021-09-08 LAB — IRON,TIBC AND FERRITIN PANEL
%SAT: 9 % (calc) — ABNORMAL LOW (ref 16–45)
Ferritin: 2 ng/mL — ABNORMAL LOW (ref 16–288)
Iron: 38 ug/dL — ABNORMAL LOW (ref 45–160)
TIBC: 429 mcg/dL (calc) (ref 250–450)

## 2021-09-08 LAB — B12 AND FOLATE PANEL
Folate: 9.9 ng/mL
Vitamin B-12: 345 pg/mL (ref 200–1100)

## 2021-09-08 LAB — CBC WITH DIFFERENTIAL/PLATELET
Absolute Monocytes: 348 cells/uL (ref 200–950)
Basophils Absolute: 42 cells/uL (ref 0–200)
Basophils Relative: 0.8 %
Eosinophils Absolute: 151 cells/uL (ref 15–500)
Eosinophils Relative: 2.9 %
HCT: 30.2 % — ABNORMAL LOW (ref 35.0–45.0)
Hemoglobin: 8.5 g/dL — ABNORMAL LOW (ref 11.7–15.5)
Lymphs Abs: 1232 cells/uL (ref 850–3900)
MCH: 20.2 pg — ABNORMAL LOW (ref 27.0–33.0)
MCHC: 28.1 g/dL — ABNORMAL LOW (ref 32.0–36.0)
MCV: 71.9 fL — ABNORMAL LOW (ref 80.0–100.0)
MPV: 10.7 fL (ref 7.5–12.5)
Monocytes Relative: 6.7 %
Neutro Abs: 3427 cells/uL (ref 1500–7800)
Neutrophils Relative %: 65.9 %
Platelets: 247 10*3/uL (ref 140–400)
RBC: 4.2 10*6/uL (ref 3.80–5.10)
RDW: 16.7 % — ABNORMAL HIGH (ref 11.0–15.0)
Total Lymphocyte: 23.7 %
WBC: 5.2 10*3/uL (ref 3.8–10.8)

## 2021-09-08 LAB — COMPLETE METABOLIC PANEL WITH GFR
AG Ratio: 1.5 (calc) (ref 1.0–2.5)
ALT: 8 U/L (ref 6–29)
AST: 17 U/L (ref 10–35)
Albumin: 3.7 g/dL (ref 3.6–5.1)
Alkaline phosphatase (APISO): 88 U/L (ref 37–153)
BUN: 10 mg/dL (ref 7–25)
CO2: 27 mmol/L (ref 20–32)
Calcium: 8.7 mg/dL (ref 8.6–10.4)
Chloride: 105 mmol/L (ref 98–110)
Creat: 0.68 mg/dL (ref 0.60–1.00)
Globulin: 2.4 g/dL (calc) (ref 1.9–3.7)
Glucose, Bld: 148 mg/dL — ABNORMAL HIGH (ref 65–99)
Potassium: 3.8 mmol/L (ref 3.5–5.3)
Sodium: 139 mmol/L (ref 135–146)
Total Bilirubin: 0.2 mg/dL (ref 0.2–1.2)
Total Protein: 6.1 g/dL (ref 6.1–8.1)
eGFR: 93 mL/min/{1.73_m2} (ref >=60)

## 2021-09-08 LAB — HEMOGLOBIN A1C
Hgb A1c MFr Bld: 5.9 % of total Hgb — ABNORMAL HIGH (ref <5.7)
Mean Plasma Glucose: 123 mg/dL
eAG (mmol/L): 6.8 mmol/L

## 2021-09-08 LAB — TSH: TSH: 8.5 mIU/L — ABNORMAL HIGH (ref 0.40–4.50)

## 2021-09-14 ENCOUNTER — Ambulatory Visit
Admission: RE | Admit: 2021-09-14 | Discharge: 2021-09-14 | Disposition: A | Discharge status: Home / Self Care | Payer: Medicare HMO | Source: Ambulatory Visit | Attending: Student | Admitting: Student

## 2021-09-14 ENCOUNTER — Inpatient Hospital Stay: Admission: RE | Admit: 2021-09-14 | Payer: Medicare HMO | Source: Ambulatory Visit

## 2021-09-14 ENCOUNTER — Other Ambulatory Visit: Payer: Self-pay

## 2021-09-14 DIAGNOSIS — M1712 Unilateral primary osteoarthritis, left knee: Secondary | ICD-10-CM | POA: Diagnosis not present

## 2021-09-14 DIAGNOSIS — S72142D Displaced intertrochanteric fracture of left femur, subsequent encounter for closed fracture with routine healing: Secondary | ICD-10-CM

## 2021-09-14 DIAGNOSIS — S72142A Displaced intertrochanteric fracture of left femur, initial encounter for closed fracture: Secondary | ICD-10-CM | POA: Diagnosis not present

## 2021-09-14 DIAGNOSIS — R102 Pelvic and perineal pain: Secondary | ICD-10-CM | POA: Diagnosis not present

## 2021-09-14 DIAGNOSIS — M1612 Unilateral primary osteoarthritis, left hip: Secondary | ICD-10-CM | POA: Diagnosis not present

## 2021-09-15 ENCOUNTER — Other Ambulatory Visit: Payer: Self-pay | Admitting: *Deleted

## 2021-09-15 DIAGNOSIS — D509 Iron deficiency anemia, unspecified: Secondary | ICD-10-CM

## 2021-09-15 DIAGNOSIS — D649 Anemia, unspecified: Secondary | ICD-10-CM

## 2021-09-17 ENCOUNTER — Telehealth: Payer: Self-pay | Admitting: Physician Assistant

## 2021-09-17 NOTE — Telephone Encounter (Signed)
Scheduled appt per 10/4 referral. Pt is aware of appt date and time.

## 2021-09-23 ENCOUNTER — Other Ambulatory Visit: Payer: Self-pay | Admitting: Student

## 2021-09-23 DIAGNOSIS — M25552 Pain in left hip: Secondary | ICD-10-CM
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
## 2021-09-25 ENCOUNTER — Inpatient Hospital Stay: Payer: Medicare HMO

## 2021-09-25 ENCOUNTER — Other Ambulatory Visit: Payer: Self-pay

## 2021-09-25 ENCOUNTER — Telehealth: Payer: Self-pay | Admitting: *Deleted

## 2021-09-25 ENCOUNTER — Encounter: Payer: Self-pay | Admitting: Physician Assistant

## 2021-09-25 ENCOUNTER — Inpatient Hospital Stay: Payer: Medicare HMO | Attending: Physician Assistant | Admitting: Physician Assistant

## 2021-09-25 VITALS — BP 164/80 | HR 110 | Temp 97.0°F | Resp 18 | Wt 214.6 lb

## 2021-09-25 DIAGNOSIS — D508 Other iron deficiency anemias: Secondary | ICD-10-CM

## 2021-09-25 DIAGNOSIS — F17210 Nicotine dependence, cigarettes, uncomplicated: Secondary | ICD-10-CM | POA: Insufficient documentation

## 2021-09-25 DIAGNOSIS — E039 Hypothyroidism, unspecified: Secondary | ICD-10-CM | POA: Diagnosis not present

## 2021-09-25 DIAGNOSIS — Z79899 Other long term (current) drug therapy: Secondary | ICD-10-CM | POA: Insufficient documentation

## 2021-09-25 DIAGNOSIS — M8580 Other specified disorders of bone density and structure, unspecified site: Secondary | ICD-10-CM | POA: Diagnosis not present

## 2021-09-25 DIAGNOSIS — D509 Iron deficiency anemia, unspecified: Secondary | ICD-10-CM | POA: Insufficient documentation

## 2021-09-25 LAB — CBC WITH DIFFERENTIAL (CANCER CENTER ONLY)
Abs Immature Granulocytes: 0.01 10*3/uL (ref 0.00–0.07)
Basophils Absolute: 0 10*3/uL (ref 0.0–0.1)
Basophils Relative: 0 %
Eosinophils Absolute: 0.2 10*3/uL (ref 0.0–0.5)
Eosinophils Relative: 3 %
HCT: 28.5 % — ABNORMAL LOW (ref 36.0–46.0)
Hemoglobin: 8 g/dL — ABNORMAL LOW (ref 12.0–15.0)
Immature Granulocytes: 0 %
Lymphocytes Relative: 20 %
Lymphs Abs: 1 10*3/uL (ref 0.7–4.0)
MCH: 20.2 pg — ABNORMAL LOW (ref 26.0–34.0)
MCHC: 28.1 g/dL — ABNORMAL LOW (ref 30.0–36.0)
MCV: 72 fL — ABNORMAL LOW (ref 80.0–100.0)
Monocytes Absolute: 0.4 10*3/uL (ref 0.1–1.0)
Monocytes Relative: 8 %
Neutro Abs: 3.6 10*3/uL (ref 1.7–7.7)
Neutrophils Relative %: 69 %
Platelet Count: 268 10*3/uL (ref 150–400)
RBC: 3.96 MIL/uL (ref 3.87–5.11)
RDW: 18.6 % — ABNORMAL HIGH (ref 11.5–15.5)
WBC Count: 5.2 10*3/uL (ref 4.0–10.5)
nRBC: 0 % (ref 0.0–0.2)

## 2021-09-25 LAB — CMP (CANCER CENTER ONLY)
ALT: 11 U/L (ref 0–44)
AST: 21 U/L (ref 15–41)
Albumin: 3.5 g/dL (ref 3.5–5.0)
Alkaline Phosphatase: 81 U/L (ref 38–126)
Anion gap: 9 (ref 5–15)
BUN: 10 mg/dL (ref 8–23)
CO2: 25 mmol/L (ref 22–32)
Calcium: 8.6 mg/dL — ABNORMAL LOW (ref 8.9–10.3)
Chloride: 104 mmol/L (ref 98–111)
Creatinine: 0.54 mg/dL (ref 0.44–1.00)
GFR, Estimated: >60 mL/min (ref >60)
Glucose, Bld: 130 mg/dL — ABNORMAL HIGH (ref 70–99)
Potassium: 3.4 mmol/L — ABNORMAL LOW (ref 3.5–5.1)
Sodium: 138 mmol/L (ref 135–145)
Total Bilirubin: 0.3 mg/dL (ref 0.3–1.2)
Total Protein: 6.8 g/dL (ref 6.5–8.1)

## 2021-09-25 LAB — SAMPLE TO BLOOD BANK

## 2021-09-25 LAB — IRON AND TIBC
Iron: 13 ug/dL — ABNORMAL LOW (ref 28–170)
Saturation Ratios: 3 % — ABNORMAL LOW (ref 10.4–31.8)
TIBC: 460 ug/dL — ABNORMAL HIGH (ref 250–450)
UIBC: 447 ug/dL

## 2021-09-25 LAB — RETIC PANEL
Immature Retic Fract: 30.4 % — ABNORMAL HIGH (ref 2.3–15.9)
RBC.: 3.84 MIL/uL — ABNORMAL LOW (ref 3.87–5.11)
Retic Count, Absolute: 116.7 10*3/uL (ref 19.0–186.0)
Retic Ct Pct: 3 % (ref 0.4–3.1)
Reticulocyte Hemoglobin: 18.2 pg — ABNORMAL LOW (ref >27.9)

## 2021-09-25 LAB — FERRITIN: Ferritin: 3 ng/mL — ABNORMAL LOW (ref 11–307)

## 2021-09-25 NOTE — Telephone Encounter (Signed)
Per Dede Query, P.A. called pt with message below. Pt verbalized understanding.

## 2021-09-25 NOTE — Telephone Encounter (Signed)
-----   Message from Lincoln Brigham, PA-C sent at 09/25/2021  2:29 PM EDT ----- Please call patient and let her know that labs from today confirm iron deficiency anemia. We checked with her insurance and IV venofer is preferred so she will need to come in once a week x 5 doses. Schedulers will contact her to arrange the infusion  next week.

## 2021-09-28 ENCOUNTER — Other Ambulatory Visit: Payer: Self-pay | Admitting: Pharmacy Technician

## 2021-09-29 ENCOUNTER — Ambulatory Visit
Admission: RE | Admit: 2021-09-29 | Discharge: 2021-09-29 | Disposition: A | Discharge status: Home / Self Care | Payer: Medicare HMO | Source: Ambulatory Visit | Attending: Student | Admitting: Student

## 2021-09-29 ENCOUNTER — Other Ambulatory Visit: Payer: Self-pay

## 2021-09-29 DIAGNOSIS — M25552 Pain in left hip: Secondary | ICD-10-CM

## 2021-09-29 MED ORDER — METHYLPREDNISOLONE ACETATE 40 MG/ML INJ SUSP (RADIOLOG
80.0000 mg | Freq: Once | INTRAMUSCULAR | Status: AC
  Administered 2021-09-29: 80 mg via INTRA_ARTICULAR

## 2021-09-29 MED ORDER — IOPAMIDOL (ISOVUE-M 200) INJECTION 41%
1.0000 mL | Freq: Once | INTRAMUSCULAR | Status: AC
  Administered 2021-09-29: 1 mL via INTRA_ARTICULAR

## 2021-09-30 ENCOUNTER — Ambulatory Visit (INDEPENDENT_AMBULATORY_CARE_PROVIDER_SITE_OTHER): Payer: Medicare HMO | Admitting: *Deleted

## 2021-09-30 VITALS — BP 178/76 | HR 80 | Temp 98.2°F | Resp 16 | Ht 64.0 in | Wt 215.2 lb

## 2021-09-30 DIAGNOSIS — D509 Iron deficiency anemia, unspecified: Secondary | ICD-10-CM | POA: Diagnosis not present

## 2021-09-30 DIAGNOSIS — D508 Other iron deficiency anemias: Secondary | ICD-10-CM

## 2021-09-30 MED ORDER — HEPARIN SOD (PORK) LOCK FLUSH 100 UNIT/ML IV SOLN: 250.0000 [IU] | Freq: Once | INTRAVENOUS | Status: DC | PRN

## 2021-09-30 MED ORDER — SODIUM CHLORIDE 0.9% FLUSH: 10.0000 mL | Freq: Once | INTRAVENOUS | Status: DC | PRN

## 2021-09-30 MED ORDER — ALTEPLASE 2 MG IJ SOLR: 2.0000 mg | Freq: Once | INTRAMUSCULAR | Status: DC | PRN

## 2021-09-30 MED ORDER — ANTICOAGULANT SODIUM CITRATE 4% (200MG/5ML) IV SOLN
5.0000 mL | Freq: Once | Status: DC | PRN
  Filled 2021-09-30: qty 5

## 2021-09-30 MED ORDER — ACETAMINOPHEN 325 MG PO TABS
650.0000 mg | ORAL_TABLET | Freq: Once | ORAL | Status: AC
  Administered 2021-09-30: 650 mg via ORAL
  Filled 2021-09-30: qty 2

## 2021-09-30 MED ORDER — SODIUM CHLORIDE 0.9% FLUSH: 3.0000 mL | Freq: Once | INTRAVENOUS | Status: DC | PRN

## 2021-09-30 MED ORDER — SODIUM CHLORIDE 0.9 % IV SOLN
200.0000 mg | Freq: Once | INTRAVENOUS | Status: AC
  Administered 2021-09-30: 200 mg via INTRAVENOUS
  Filled 2021-09-30: qty 10

## 2021-09-30 MED ORDER — HEPARIN SOD (PORK) LOCK FLUSH 100 UNIT/ML IV SOLN
500.0000 [IU] | Freq: Once | INTRAVENOUS | Status: DC | PRN
Start: 2021-09-30 — End: 2021-09-30

## 2021-09-30 MED ORDER — DIPHENHYDRAMINE HCL 25 MG PO CAPS
50.0000 mg | ORAL_CAPSULE | Freq: Once | ORAL | Status: AC
  Administered 2021-09-30: 50 mg via ORAL
  Filled 2021-09-30: qty 2

## 2021-09-30 NOTE — Progress Notes (Signed)
Diagnosis: Iron Deficiency Anemia  Provider:  Marshell Garfinkel, MD  Procedure: Infusion  IV Type: Peripheral, IV Location: L Antecubital  Venofer (Iron Sucrose), Dose: 200 mg  Infusion Start Time: 0097 am  Infusion Stop Time: 1057 am  Post Infusion IV Care: Observation period completed and Peripheral IV Discontinued  Discharge: Condition: Good, Destination: Home . AVS provided to patient.   Performed by:  Oren Beckmann, RN

## 2021-10-05 ENCOUNTER — Encounter: Payer: Self-pay | Admitting: Physician Assistant

## 2021-10-07 ENCOUNTER — Other Ambulatory Visit: Payer: Self-pay

## 2021-10-07 ENCOUNTER — Ambulatory Visit (INDEPENDENT_AMBULATORY_CARE_PROVIDER_SITE_OTHER): Payer: Medicare HMO

## 2021-10-07 VITALS — BP 180/72 | HR 86 | Temp 97.7°F | Resp 18 | Ht 64.0 in | Wt 212.0 lb

## 2021-10-07 DIAGNOSIS — D508 Other iron deficiency anemias: Secondary | ICD-10-CM

## 2021-10-07 MED ORDER — EPINEPHRINE 0.3 MG/0.3ML IJ SOAJ: 0.3000 mg | Freq: Once | INTRAMUSCULAR | Status: DC | PRN

## 2021-10-07 MED ORDER — ACETAMINOPHEN 325 MG PO TABS
650.0000 mg | ORAL_TABLET | Freq: Once | ORAL | Status: AC
  Administered 2021-10-07: 650 mg via ORAL
  Filled 2021-10-07: qty 2

## 2021-10-07 MED ORDER — SODIUM CHLORIDE 0.9 % IV SOLN: Freq: Once | INTRAVENOUS | Status: DC | PRN

## 2021-10-07 MED ORDER — METHYLPREDNISOLONE SODIUM SUCC 125 MG IJ SOLR: 125.0000 mg | Freq: Once | INTRAMUSCULAR | Status: DC | PRN

## 2021-10-07 MED ORDER — DIPHENHYDRAMINE HCL 50 MG/ML IJ SOLN: 50.0000 mg | Freq: Once | INTRAMUSCULAR | Status: DC | PRN

## 2021-10-07 MED ORDER — DIPHENHYDRAMINE HCL 25 MG PO CAPS
50.0000 mg | ORAL_CAPSULE | Freq: Once | ORAL | Status: AC
  Administered 2021-10-07: 50 mg via ORAL
  Filled 2021-10-07: qty 2

## 2021-10-07 MED ORDER — SODIUM CHLORIDE 0.9 % IV SOLN
200.0000 mg | Freq: Once | INTRAVENOUS | Status: AC
  Administered 2021-10-07: 200 mg via INTRAVENOUS
  Filled 2021-10-07: qty 10

## 2021-10-07 MED ORDER — ALBUTEROL SULFATE HFA 108 (90 BASE) MCG/ACT IN AERS: 2.0000 | INHALATION_SPRAY | Freq: Once | RESPIRATORY_TRACT | Status: DC | PRN

## 2021-10-07 MED ORDER — FAMOTIDINE IN NACL 20-0.9 MG/50ML-% IV SOLN: 20.0000 mg | Freq: Once | INTRAVENOUS | Status: DC | PRN

## 2021-10-07 NOTE — Progress Notes (Signed)
Diagnosis: Iron Deficiency Anemia  Provider:  Marshell Garfinkel, MD  Procedure: Infusion  IV Type: Peripheral, IV Location: R Hand  Venofer (Iron Sucrose), Dose: 200 mg  Infusion Start Time: 8403  Infusion Stop Time: 10.08  Post Infusion IV Care: Peripheral IV Discontinued  Discharge: Condition: Good, Destination: Home . AVS provided to patient.   Performed by:  Arnoldo Morale, RN

## 2021-10-14 ENCOUNTER — Other Ambulatory Visit: Payer: Self-pay

## 2021-10-14 ENCOUNTER — Ambulatory Visit (INDEPENDENT_AMBULATORY_CARE_PROVIDER_SITE_OTHER): Payer: Medicare HMO | Admitting: *Deleted

## 2021-10-14 VITALS — BP 166/74 | HR 72 | Temp 98.0°F | Resp 18 | Ht 64.0 in | Wt 213.8 lb

## 2021-10-14 DIAGNOSIS — D508 Other iron deficiency anemias: Secondary | ICD-10-CM | POA: Diagnosis not present

## 2021-10-14 MED ORDER — DIPHENHYDRAMINE HCL 25 MG PO CAPS
50.0000 mg | ORAL_CAPSULE | Freq: Once | ORAL | Status: AC
  Administered 2021-10-14: 50 mg via ORAL
  Filled 2021-10-14: qty 2

## 2021-10-14 MED ORDER — SODIUM CHLORIDE 0.9 % IV SOLN
200.0000 mg | Freq: Once | INTRAVENOUS | Status: AC
  Administered 2021-10-14: 200 mg via INTRAVENOUS
  Filled 2021-10-14: qty 10

## 2021-10-14 MED ORDER — ACETAMINOPHEN 325 MG PO TABS
650.0000 mg | ORAL_TABLET | Freq: Once | ORAL | Status: AC
  Administered 2021-10-14: 650 mg via ORAL
  Filled 2021-10-14: qty 2

## 2021-10-14 NOTE — Progress Notes (Signed)
Diagnosis: Iron Deficiency Anemia  Provider:  Marshell Garfinkel, MD  Procedure: Infusion  IV Type: Peripheral, IV Location: R Hand  Venofer (Iron Sucrose), Dose: 200 mg  Infusion Start Time: 0940 am  Infusion Stop Time: 6825 am  Post Infusion IV Care: Observation period completed  Discharge: Condition: Good, Destination: Home . AVS provided to patient.   Performed by:  Oren Beckmann, RN

## 2021-10-19 ENCOUNTER — Other Ambulatory Visit: Payer: Self-pay

## 2021-10-19 ENCOUNTER — Encounter: Payer: Self-pay | Admitting: Family Medicine

## 2021-10-19 ENCOUNTER — Ambulatory Visit (INDEPENDENT_AMBULATORY_CARE_PROVIDER_SITE_OTHER): Payer: Medicare HMO | Admitting: Family Medicine

## 2021-10-19 VITALS — BP 134/76 | HR 88 | Temp 97.6°F | Resp 16 | Ht 64.0 in | Wt 212.0 lb

## 2021-10-19 DIAGNOSIS — J441 Chronic obstructive pulmonary disease with (acute) exacerbation: Secondary | ICD-10-CM

## 2021-10-19 MED ORDER — PREDNISONE 20 MG PO TABS: ORAL_TABLET | ORAL | 0 refills | Status: DC

## 2021-10-19 MED ORDER — ALBUTEROL SULFATE HFA 108 (90 BASE) MCG/ACT IN AERS: 2.0000 | INHALATION_SPRAY | Freq: Four times a day (QID) | RESPIRATORY_TRACT | 0 refills | Status: DC | PRN

## 2021-10-19 MED ORDER — AZITHROMYCIN 250 MG PO TABS: ORAL_TABLET | ORAL | 0 refills | Status: DC
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
## 2021-10-21 ENCOUNTER — Other Ambulatory Visit: Payer: Self-pay

## 2021-10-21 ENCOUNTER — Ambulatory Visit (INDEPENDENT_AMBULATORY_CARE_PROVIDER_SITE_OTHER): Payer: Medicare HMO

## 2021-10-21 VITALS — BP 171/89 | HR 71 | Temp 98.2°F | Resp 16 | Ht 64.0 in | Wt 214.4 lb

## 2021-10-21 DIAGNOSIS — D508 Other iron deficiency anemias: Secondary | ICD-10-CM | POA: Diagnosis not present

## 2021-10-21 MED ORDER — ALBUTEROL SULFATE HFA 108 (90 BASE) MCG/ACT IN AERS: 2.0000 | INHALATION_SPRAY | Freq: Once | RESPIRATORY_TRACT | Status: DC | PRN

## 2021-10-21 MED ORDER — FAMOTIDINE IN NACL 20-0.9 MG/50ML-% IV SOLN: 20.0000 mg | Freq: Once | INTRAVENOUS | Status: DC | PRN

## 2021-10-21 MED ORDER — ACETAMINOPHEN 325 MG PO TABS
650.0000 mg | ORAL_TABLET | Freq: Once | ORAL | Status: AC
  Administered 2021-10-21: 650 mg via ORAL
  Filled 2021-10-21: qty 2

## 2021-10-21 MED ORDER — SODIUM CHLORIDE 0.9 % IV SOLN
200.0000 mg | Freq: Once | INTRAVENOUS | Status: AC
  Administered 2021-10-21: 200 mg via INTRAVENOUS
  Filled 2021-10-21: qty 10

## 2021-10-21 MED ORDER — DIPHENHYDRAMINE HCL 50 MG/ML IJ SOLN: 50.0000 mg | Freq: Once | INTRAMUSCULAR | Status: DC | PRN

## 2021-10-21 MED ORDER — METHYLPREDNISOLONE SODIUM SUCC 125 MG IJ SOLR: 125.0000 mg | Freq: Once | INTRAMUSCULAR | Status: DC | PRN

## 2021-10-21 MED ORDER — EPINEPHRINE 0.3 MG/0.3ML IJ SOAJ: 0.3000 mg | Freq: Once | INTRAMUSCULAR | Status: DC | PRN

## 2021-10-21 MED ORDER — DIPHENHYDRAMINE HCL 25 MG PO CAPS
50.0000 mg | ORAL_CAPSULE | Freq: Once | ORAL | Status: AC
  Administered 2021-10-21: 50 mg via ORAL
  Filled 2021-10-21: qty 2

## 2021-10-21 MED ORDER — SODIUM CHLORIDE 0.9 % IV SOLN: Freq: Once | INTRAVENOUS | Status: DC | PRN

## 2021-10-21 NOTE — Progress Notes (Signed)
Diagnosis: Iron Deficiency Anemia  Provider:  Marshell Garfinkel, MD  Procedure: Infusion  IV Type: Peripheral, IV Location: L Forearm  Venofer (Iron Sucrose), Dose: 200 mg  Infusion Start Time: 1448  Infusion Stop Time: 10.30  Post Infusion IV Care: Peripheral IV Discontinued  Discharge: Condition: Good, Destination: Home . AVS provided to patient.   Performed by:  [employer], Sherlon Handing, LPN

## 2021-10-28 ENCOUNTER — Other Ambulatory Visit: Payer: Self-pay

## 2021-10-28 ENCOUNTER — Ambulatory Visit (INDEPENDENT_AMBULATORY_CARE_PROVIDER_SITE_OTHER): Payer: Medicare HMO

## 2021-10-28 VITALS — BP 149/71 | HR 79 | Temp 98.7°F | Resp 16 | Ht 64.0 in | Wt 216.2 lb

## 2021-10-28 DIAGNOSIS — D508 Other iron deficiency anemias: Secondary | ICD-10-CM | POA: Diagnosis not present

## 2021-10-28 MED ORDER — SODIUM CHLORIDE 0.9 % IV SOLN
200.0000 mg | Freq: Once | INTRAVENOUS | Status: AC
  Administered 2021-10-28: 200 mg via INTRAVENOUS
  Filled 2021-10-28: qty 10

## 2021-10-28 MED ORDER — ACETAMINOPHEN 325 MG PO TABS
650.0000 mg | ORAL_TABLET | Freq: Once | ORAL | Status: AC
  Administered 2021-10-28: 650 mg via ORAL

## 2021-10-28 MED ORDER — FAMOTIDINE IN NACL 20-0.9 MG/50ML-% IV SOLN: 20.0000 mg | Freq: Once | INTRAVENOUS | Status: DC | PRN

## 2021-10-28 MED ORDER — EPINEPHRINE 0.3 MG/0.3ML IJ SOAJ: 0.3000 mg | Freq: Once | INTRAMUSCULAR | Status: DC | PRN

## 2021-10-28 MED ORDER — DIPHENHYDRAMINE HCL 50 MG/ML IJ SOLN: 50.0000 mg | Freq: Once | INTRAMUSCULAR | Status: DC | PRN

## 2021-10-28 MED ORDER — SODIUM CHLORIDE 0.9 % IV SOLN: Freq: Once | INTRAVENOUS | Status: DC | PRN

## 2021-10-28 MED ORDER — DIPHENHYDRAMINE HCL 25 MG PO CAPS
50.0000 mg | ORAL_CAPSULE | Freq: Once | ORAL | Status: AC
  Administered 2021-10-28: 50 mg via ORAL

## 2021-10-28 MED ORDER — METHYLPREDNISOLONE SODIUM SUCC 125 MG IJ SOLR: 125.0000 mg | Freq: Once | INTRAMUSCULAR | Status: DC | PRN

## 2021-10-28 MED ORDER — ALBUTEROL SULFATE HFA 108 (90 BASE) MCG/ACT IN AERS: 2.0000 | INHALATION_SPRAY | Freq: Once | RESPIRATORY_TRACT | Status: DC | PRN

## 2021-10-28 NOTE — Progress Notes (Signed)
Diagnosis: Iron Deficiency Anemia  Provider:  Marshell Garfinkel, MD  Procedure: Infusion  IV Type: Peripheral, IV Location: R Antecubital  Venofer (Iron Sucrose), Dose: 200 mg  Infusion Start Time: 2589  Infusion Stop Time: 1010  Post Infusion IV Care: Peripheral IV Discontinued  Discharge: Condition: Good, Destination: Home . AVS provided to patient.   Performed by:  Charlie Pitter, RN

## 2021-10-29 ENCOUNTER — Other Ambulatory Visit: Payer: Self-pay | Admitting: Physician Assistant

## 2021-10-29 DIAGNOSIS — D508 Other iron deficiency anemias: Secondary | ICD-10-CM

## 2021-10-30 ENCOUNTER — Telehealth: Payer: Self-pay

## 2021-10-30 ENCOUNTER — Other Ambulatory Visit: Payer: Self-pay

## 2021-10-30 ENCOUNTER — Inpatient Hospital Stay: Payer: Medicare HMO | Attending: Physician Assistant

## 2021-10-30 ENCOUNTER — Inpatient Hospital Stay (HOSPITAL_BASED_OUTPATIENT_CLINIC_OR_DEPARTMENT_OTHER): Payer: Medicare HMO | Admitting: Physician Assistant

## 2021-10-30 VITALS — BP 155/77 | HR 96 | Temp 97.7°F | Resp 17 | Ht 64.0 in | Wt 216.5 lb

## 2021-10-30 DIAGNOSIS — D508 Other iron deficiency anemias: Secondary | ICD-10-CM

## 2021-10-30 DIAGNOSIS — E039 Hypothyroidism, unspecified: Secondary | ICD-10-CM | POA: Diagnosis not present

## 2021-10-30 DIAGNOSIS — Z8719 Personal history of other diseases of the digestive system: Secondary | ICD-10-CM | POA: Diagnosis not present

## 2021-10-30 DIAGNOSIS — F17210 Nicotine dependence, cigarettes, uncomplicated: Secondary | ICD-10-CM | POA: Insufficient documentation

## 2021-10-30 DIAGNOSIS — D509 Iron deficiency anemia, unspecified: Secondary | ICD-10-CM | POA: Diagnosis not present

## 2021-10-30 DIAGNOSIS — Z87440 Personal history of urinary (tract) infections: Secondary | ICD-10-CM | POA: Diagnosis not present

## 2021-10-30 LAB — CBC WITH DIFFERENTIAL (CANCER CENTER ONLY)
Abs Immature Granulocytes: 0.01 10*3/uL (ref 0.00–0.07)
Basophils Absolute: 0 10*3/uL (ref 0.0–0.1)
Basophils Relative: 0 %
Eosinophils Absolute: 0.2 10*3/uL (ref 0.0–0.5)
Eosinophils Relative: 3 %
HCT: 33.6 % — ABNORMAL LOW (ref 36.0–46.0)
Hemoglobin: 9.7 g/dL — ABNORMAL LOW (ref 12.0–15.0)
Immature Granulocytes: 0 %
Lymphocytes Relative: 21 %
Lymphs Abs: 1.2 10*3/uL (ref 0.7–4.0)
MCH: 23.1 pg — ABNORMAL LOW (ref 26.0–34.0)
MCHC: 28.9 g/dL — ABNORMAL LOW (ref 30.0–36.0)
MCV: 80 fL (ref 80.0–100.0)
Monocytes Absolute: 0.4 10*3/uL (ref 0.1–1.0)
Monocytes Relative: 7 %
Neutro Abs: 4 10*3/uL (ref 1.7–7.7)
Neutrophils Relative %: 69 %
Platelet Count: 239 10*3/uL (ref 150–400)
RBC: 4.2 MIL/uL (ref 3.87–5.11)
RDW: 25.1 % — ABNORMAL HIGH (ref 11.5–15.5)
WBC Count: 5.7 10*3/uL (ref 4.0–10.5)
nRBC: 0 % (ref 0.0–0.2)

## 2021-10-30 LAB — FERRITIN: Ferritin: 123 ng/mL (ref 11–307)

## 2021-10-30 LAB — IRON AND TIBC
Iron: 41 ug/dL (ref 41–142)
Saturation Ratios: 13 % — ABNORMAL LOW (ref 21–57)
TIBC: 316 ug/dL (ref 236–444)
UIBC: 275 ug/dL (ref 120–384)
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
## 2021-10-30 NOTE — Telephone Encounter (Signed)
Referral faxed to [facility] [phone].  Confirmation rec'd.

## 2021-11-10 ENCOUNTER — Telehealth: Payer: Self-pay

## 2021-11-10 NOTE — Telephone Encounter (Signed)
Per Reuben Likes at [facility] pt declined to schedule referral appt.  She wants to wait until after her next appt at [facility].

## 2021-12-01 ENCOUNTER — Inpatient Hospital Stay: Payer: Medicare HMO | Attending: Physician Assistant

## 2021-12-01 ENCOUNTER — Other Ambulatory Visit: Payer: Self-pay

## 2021-12-01 DIAGNOSIS — D509 Iron deficiency anemia, unspecified: Secondary | ICD-10-CM | POA: Insufficient documentation

## 2021-12-01 DIAGNOSIS — D508 Other iron deficiency anemias: Secondary | ICD-10-CM

## 2021-12-01 LAB — CBC WITH DIFFERENTIAL (CANCER CENTER ONLY)
Abs Immature Granulocytes: 0.02 10*3/uL (ref 0.00–0.07)
Basophils Absolute: 0 10*3/uL (ref 0.0–0.1)
Basophils Relative: 0 %
Eosinophils Absolute: 0.1 10*3/uL (ref 0.0–0.5)
Eosinophils Relative: 2 %
HCT: 34.9 % — ABNORMAL LOW (ref 36.0–46.0)
Hemoglobin: 10.5 g/dL — ABNORMAL LOW (ref 12.0–15.0)
Immature Granulocytes: 0 %
Lymphocytes Relative: 18 %
Lymphs Abs: 1.1 10*3/uL (ref 0.7–4.0)
MCH: 24.4 pg — ABNORMAL LOW (ref 26.0–34.0)
MCHC: 30.1 g/dL (ref 30.0–36.0)
MCV: 81.2 fL (ref 80.0–100.0)
Monocytes Absolute: 0.4 10*3/uL (ref 0.1–1.0)
Monocytes Relative: 6 %
Neutro Abs: 4.5 10*3/uL (ref 1.7–7.7)
Neutrophils Relative %: 74 %
Platelet Count: 252 10*3/uL (ref 150–400)
RBC: 4.3 MIL/uL (ref 3.87–5.11)
RDW: 20.3 % — ABNORMAL HIGH (ref 11.5–15.5)
WBC Count: 6.1 10*3/uL (ref 4.0–10.5)
nRBC: 0 % (ref 0.0–0.2)

## 2021-12-01 LAB — IRON AND IRON BINDING CAPACITY (CC-WL,HP ONLY)
Iron: 27 ug/dL — ABNORMAL LOW (ref 28–170)
Saturation Ratios: 7 % — ABNORMAL LOW (ref 10.4–31.8)
TIBC: 405 ug/dL (ref 250–450)
UIBC: 378 ug/dL (ref 148–442)

## 2021-12-01 LAB — FERRITIN: Ferritin: 34 ng/mL (ref 11–307)

## 2022-01-21 ENCOUNTER — Other Ambulatory Visit: Payer: Self-pay | Admitting: Physician Assistant

## 2022-01-21 DIAGNOSIS — D508 Other iron deficiency anemias: Secondary | ICD-10-CM

## 2022-01-22 ENCOUNTER — Ambulatory Visit: Payer: Medicare HMO | Admitting: Physician Assistant

## 2022-01-22 ENCOUNTER — Inpatient Hospital Stay: Payer: Medicare HMO

## 2022-01-22 ENCOUNTER — Telehealth: Payer: Self-pay | Admitting: Physician Assistant

## 2022-01-22 NOTE — Telephone Encounter (Signed)
Attempted to r/s per pt request per 2/10 inbasket msg; Informed pt I was rescheduling Lab + MD per her request, pt was adamant she did not want to do more labs and that she had already received lab work. I informed her of Irene's orders from 11/18 los " 4 weeks: labs only -12 weeks: labs/office visit" , Patient then requested I cancel and not reschedule her appt and stated she was not coming for more lab work, appts cancelled. PA notified

## 2022-01-28 DIAGNOSIS — H43823 Vitreomacular adhesion, bilateral: Secondary | ICD-10-CM | POA: Diagnosis not present

## 2022-01-28 DIAGNOSIS — H2511 Age-related nuclear cataract, right eye: Secondary | ICD-10-CM | POA: Diagnosis not present

## 2022-02-09 ENCOUNTER — Other Ambulatory Visit: Payer: Medicare HMO

## 2022-02-26 ENCOUNTER — Telehealth: Payer: Self-pay | Admitting: Family Medicine

## 2022-02-26 NOTE — Chronic Care Management (AMB) (Signed)
?  Chronic Care Management  ? ?Outreach Note ? ?02/26/2022 ?Name: Pamela Cherry MRN: [id number] DOB: 11-Dec-1951 ? ?Referred by: Susy Frizzle, MD ?Reason for referral : No chief complaint on file. ? ? ?An unsuccessful telephone outreach was attempted today. The patient was referred to the pharmacist for assistance with care management and care coordination.  ? ?Follow Up Plan:  ? ?Pamela Cherry ?Upstream Scheduler  ?

## 2022-03-02 ENCOUNTER — Telehealth: Payer: Self-pay | Admitting: Family Medicine

## 2022-03-02 NOTE — Chronic Care Management (AMB) (Signed)
?  Chronic Care Management  ? ?Note ? ?03/02/2022 ?Name: [name] MRN: [id number] DOB: 30-Nov-1951 ? ?Pamela Cherry is a 71 y.o. year old female who is a primary care patient of Dennard Schaumann, Cammie Mcgee, MD. I reached out to Ephraim Hamburger by phone today in response to a referral sent by Ms. Ulyses Southward Glance's PCP, Susy Frizzle, MD.  ? ?Ms. Steppe was given information about Chronic Care Management services today including:  ?CCM service includes personalized support from designated clinical staff supervised by her physician, including individualized plan of care and coordination with other care providers ?24/7 contact phone numbers for assistance for urgent and routine care needs. ?Service will only be billed when office clinical staff spend 20 minutes or more in a month to coordinate care. ?Only one practitioner may furnish and bill the service in a calendar month. ?The patient may stop CCM services at any time (effective at the end of the month) by phone call to the office staff. ? ? ?Patient did not agree to enrollment in care management services and does not wish to consider at this time. ? ?Follow up plan: pt states she is moving out of town and will need to [facility] PCP ? ? ?Tatjana Dellinger ?Upstream Scheduler  ?

## 2022-06-11 ENCOUNTER — Ambulatory Visit (INDEPENDENT_AMBULATORY_CARE_PROVIDER_SITE_OTHER): Payer: Medicare HMO | Admitting: Family Medicine

## 2022-06-11 VITALS — BP 130/64 | HR 100 | Temp 98.5°F | Ht 64.0 in | Wt 206.0 lb

## 2022-06-11 DIAGNOSIS — I10 Essential (primary) hypertension: Secondary | ICD-10-CM

## 2022-06-11 DIAGNOSIS — E039 Hypothyroidism, unspecified: Secondary | ICD-10-CM

## 2022-06-11 DIAGNOSIS — E119 Type 2 diabetes mellitus without complications: Secondary | ICD-10-CM | POA: Diagnosis not present
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
## 2022-06-12 LAB — HEMOGLOBIN A1C
Hgb A1c MFr Bld: 6.1 % of total Hgb — ABNORMAL HIGH (ref <5.7)
Mean Plasma Glucose: 128 mg/dL
eAG (mmol/L): 7.1 mmol/L

## 2022-06-12 LAB — CBC WITH DIFFERENTIAL/PLATELET
Absolute Monocytes: 429 cells/uL (ref 200–950)
Basophils Absolute: 33 cells/uL (ref 0–200)
Basophils Relative: 0.5 %
Eosinophils Absolute: 150 cells/uL (ref 15–500)
Eosinophils Relative: 2.3 %
HCT: 35.1 % (ref 35.0–45.0)
Hemoglobin: 10.7 g/dL — ABNORMAL LOW (ref 11.7–15.5)
Lymphs Abs: 1385 cells/uL (ref 850–3900)
MCH: 23.6 pg — ABNORMAL LOW (ref 27.0–33.0)
MCHC: 30.5 g/dL — ABNORMAL LOW (ref 32.0–36.0)
MCV: 77.5 fL — ABNORMAL LOW (ref 80.0–100.0)
MPV: 10.9 fL (ref 7.5–12.5)
Monocytes Relative: 6.6 %
Neutro Abs: 4505 cells/uL (ref 1500–7800)
Neutrophils Relative %: 69.3 %
Platelets: 293 10*3/uL (ref 140–400)
RBC: 4.53 10*6/uL (ref 3.80–5.10)
RDW: 15.6 % — ABNORMAL HIGH (ref 11.0–15.0)
Total Lymphocyte: 21.3 %
WBC: 6.5 10*3/uL (ref 3.8–10.8)

## 2022-06-12 LAB — COMPLETE METABOLIC PANEL WITH GFR
AG Ratio: 1.3 (calc) (ref 1.0–2.5)
ALT: 8 U/L (ref 6–29)
AST: 16 U/L (ref 10–35)
Albumin: 3.6 g/dL (ref 3.6–5.1)
Alkaline phosphatase (APISO): 93 U/L (ref 37–153)
BUN: 16 mg/dL (ref 7–25)
CO2: 27 mmol/L (ref 20–32)
Calcium: 9.4 mg/dL (ref 8.6–10.4)
Chloride: 104 mmol/L (ref 98–110)
Creat: 0.62 mg/dL (ref 0.60–1.00)
Globulin: 2.7 g/dL (calc) (ref 1.9–3.7)
Glucose, Bld: 173 mg/dL — ABNORMAL HIGH (ref 65–99)
Potassium: 4 mmol/L (ref 3.5–5.3)
Sodium: 141 mmol/L (ref 135–146)
Total Bilirubin: 0.2 mg/dL (ref 0.2–1.2)
Total Protein: 6.3 g/dL (ref 6.1–8.1)
eGFR: 95 mL/min/{1.73_m2} (ref >=60)

## 2022-06-12 LAB — TSH: TSH: 9.71 mIU/L — ABNORMAL HIGH (ref 0.40–4.50)

## 2022-06-12 LAB — MICROALBUMIN, URINE: Microalb, Ur: 0.9 mg/dL

## 2022-06-16 ENCOUNTER — Telehealth: Payer: Self-pay

## 2022-06-16 ENCOUNTER — Other Ambulatory Visit: Payer: Self-pay

## 2022-06-16 MED ORDER — LEVOTHYROXINE SODIUM 137 MCG PO TABS: 137.0000 ug | ORAL_TABLET | Freq: Every day | ORAL | 3 refills | Status: DC

## 2022-06-16 NOTE — Telephone Encounter (Signed)
06/14/22 Rx for prosthetics fax for to DME per [place] went thru 07/04/22 per fax confirmation

## 2022-06-17 ENCOUNTER — Telehealth: Payer: Self-pay

## 2022-06-17 NOTE — Telephone Encounter (Signed)
Pt called stating that she also need a Rx for Diabetic Shoes, as well.  Pls write Rx and Ill fax to DME.  Thanks

## 2022-06-21 ENCOUNTER — Other Ambulatory Visit: Payer: Medicare HMO

## 2022-06-21 ENCOUNTER — Telehealth: Payer: Self-pay | Admitting: Family Medicine

## 2022-06-21 NOTE — Telephone Encounter (Signed)
Patient would like stool sample kit sent to her house. She can't come in at this time.  CB# [phone]

## 2022-06-21 NOTE — Telephone Encounter (Signed)
Spoke with pt today, pt has lab appt for 07/30/22 to recheck iron, however, pt don't want to do the stool sample.

## 2022-07-05 ENCOUNTER — Other Ambulatory Visit: Payer: Medicare HMO

## 2022-07-08 IMAGING — DX DG CHEST 1V PORT
1 series · 1 of 1 positions shown · non-contrast
Comparison: June 07, 2019

CLINICAL DATA: Preoperative assessment for hip surgery

EXAM:
PORTABLE CHEST 1 VIEW

[chest ap]
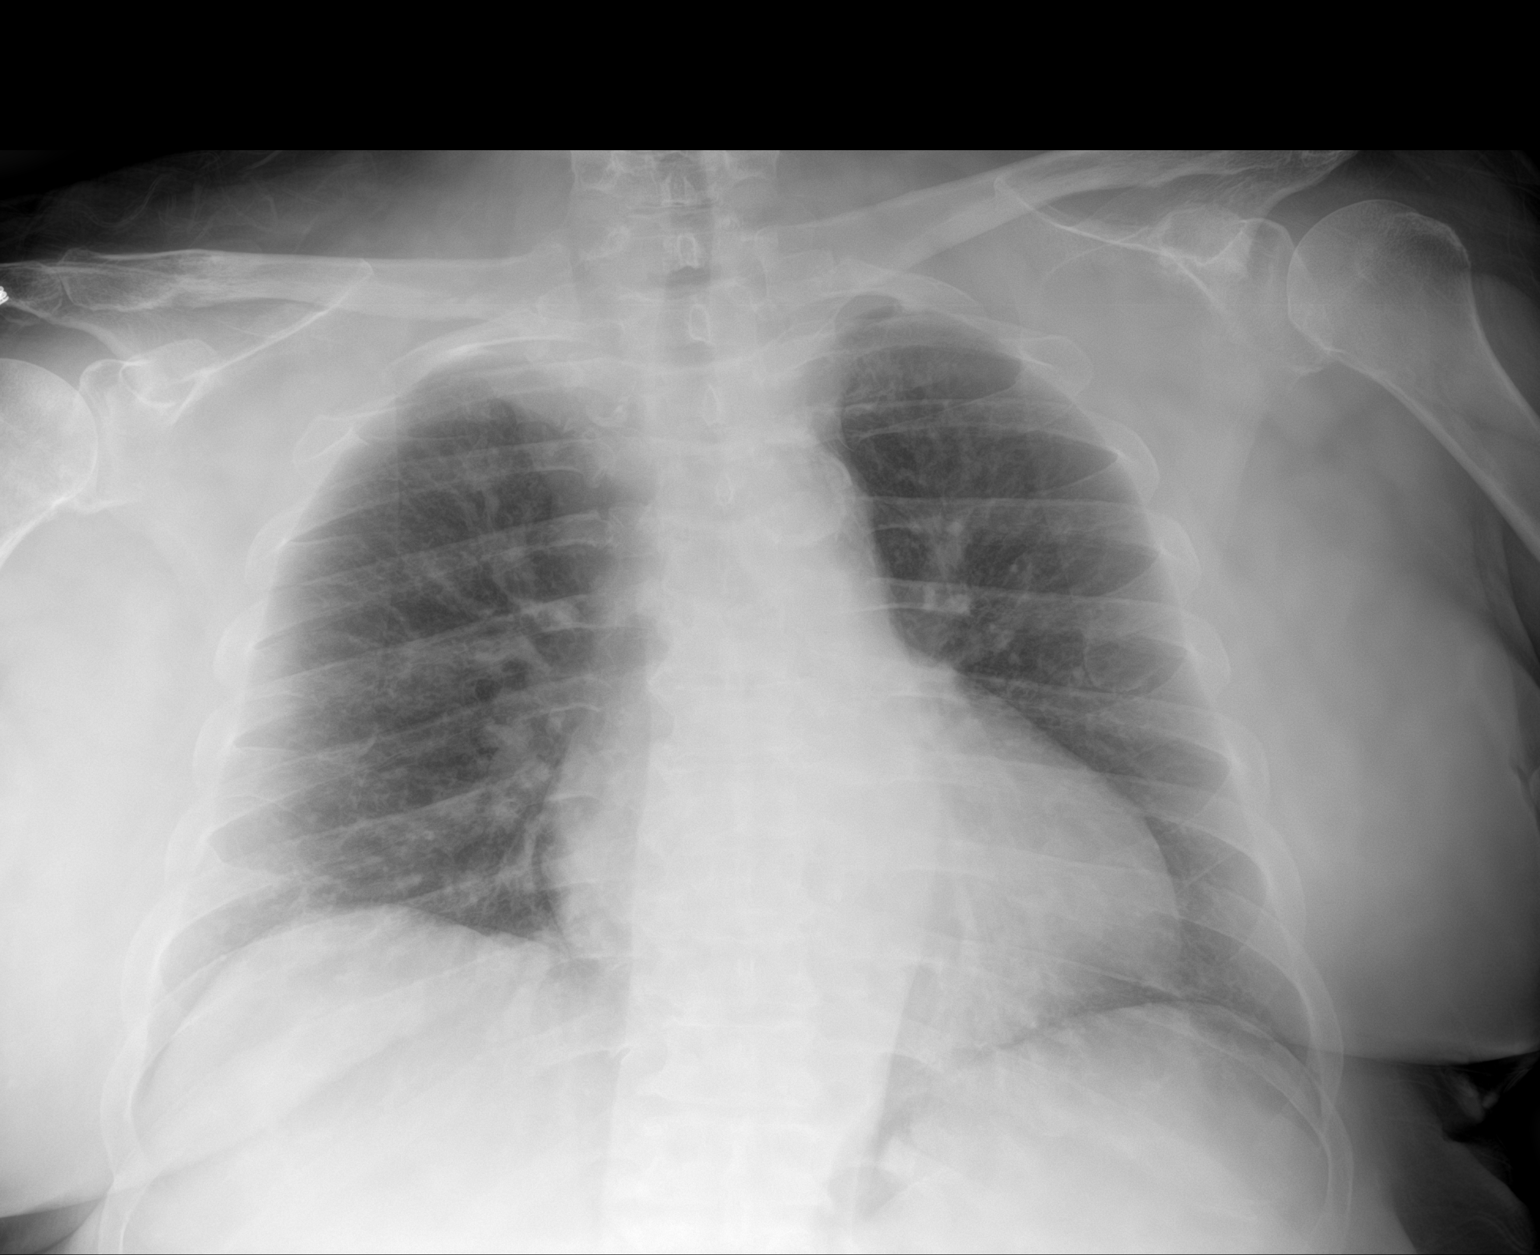

[1 of 1 positions shown; findings below may reference images not displayed]

FINDINGS: The lungs are clear. Heart is borderline enlarged with pulmonary
vascularity normal. No adenopathy. There is aortic atherosclerosis.
No bone lesions.
IMPRESSION: Lungs clear. Borderline cardiac enlargement. Aortic Atherosclerosis
([id number]).

## 2022-07-08 IMAGING — DX DG HIP (WITH OR WITHOUT PELVIS) 2-3V*L*
3 series · 3 of 3 positions shown · non-contrast
Comparison: None.

CLINICAL DATA: Fall, left hip pain

EXAM:
DG HIP (WITH OR WITHOUT PELVIS) 2-3V LEFT

[hip ap]
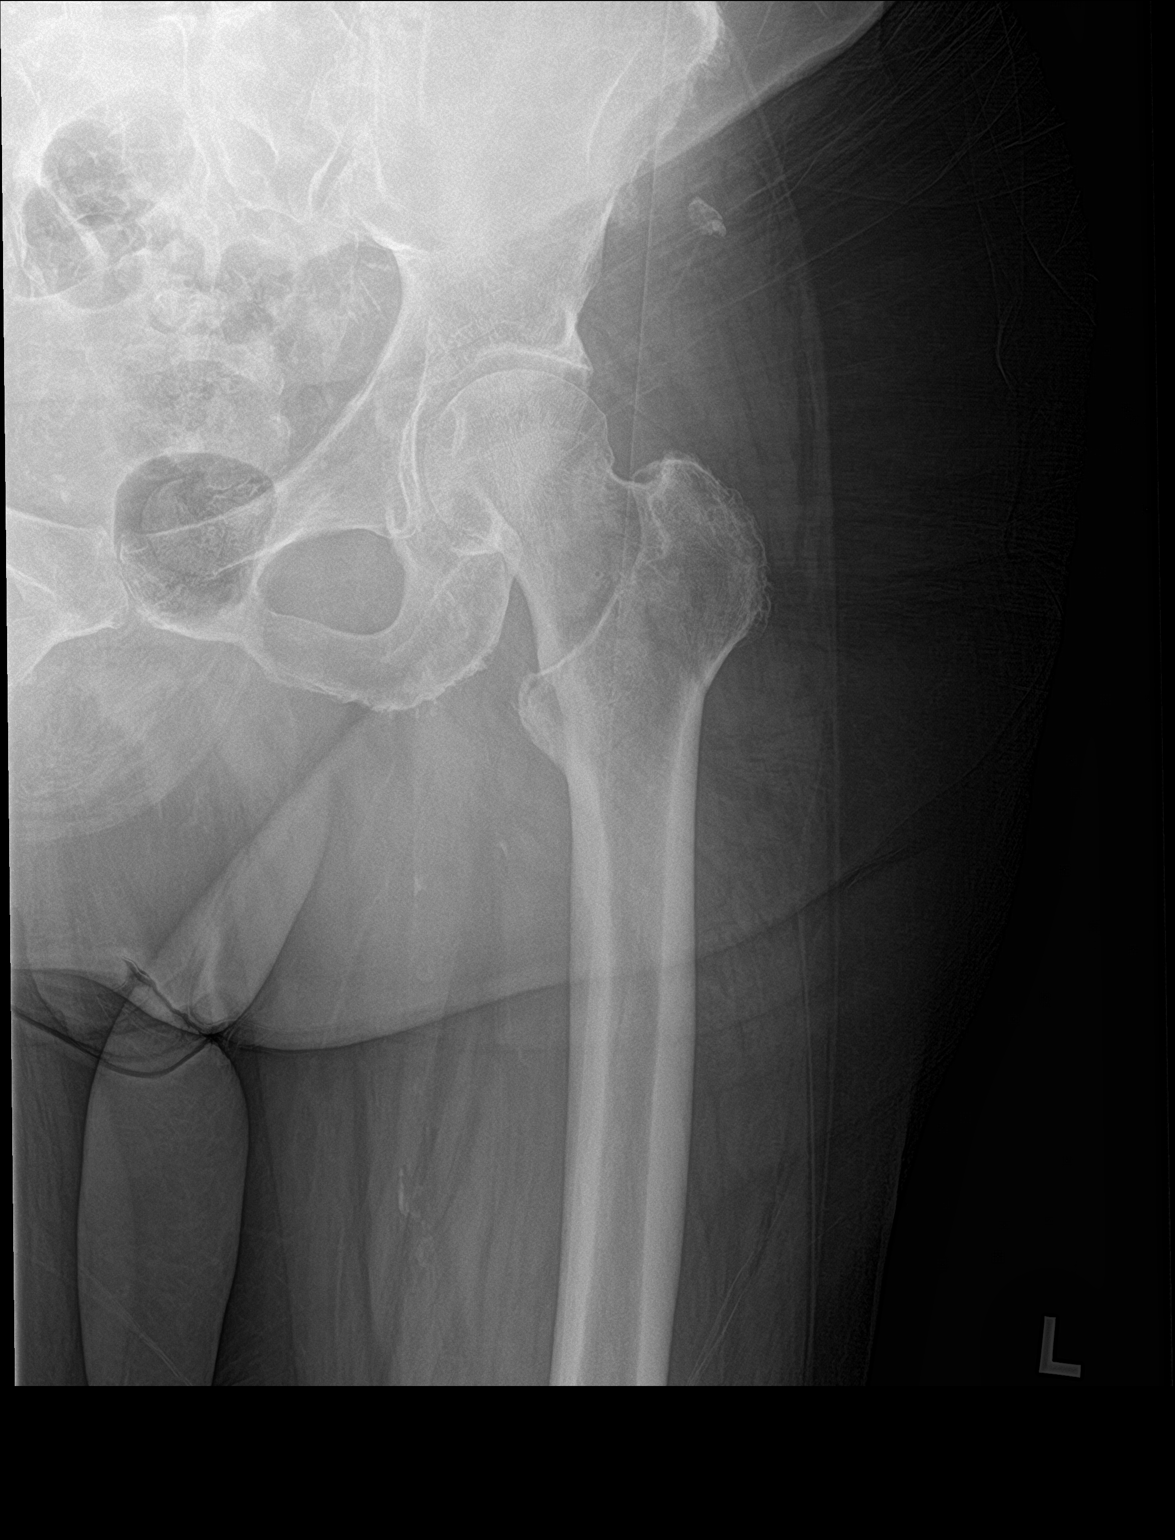

[hip x-table]
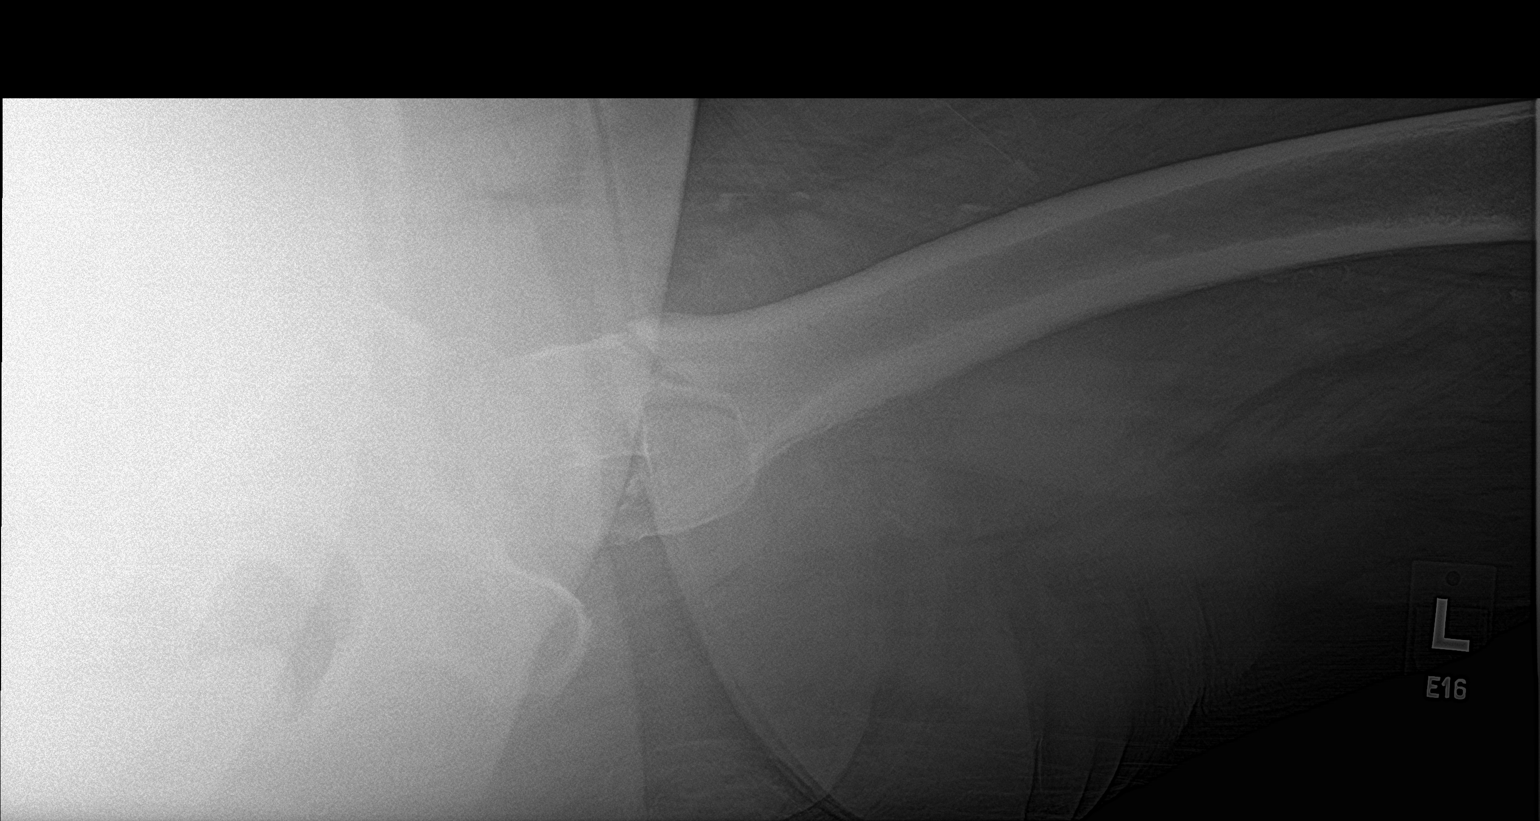

[pelvis ap]
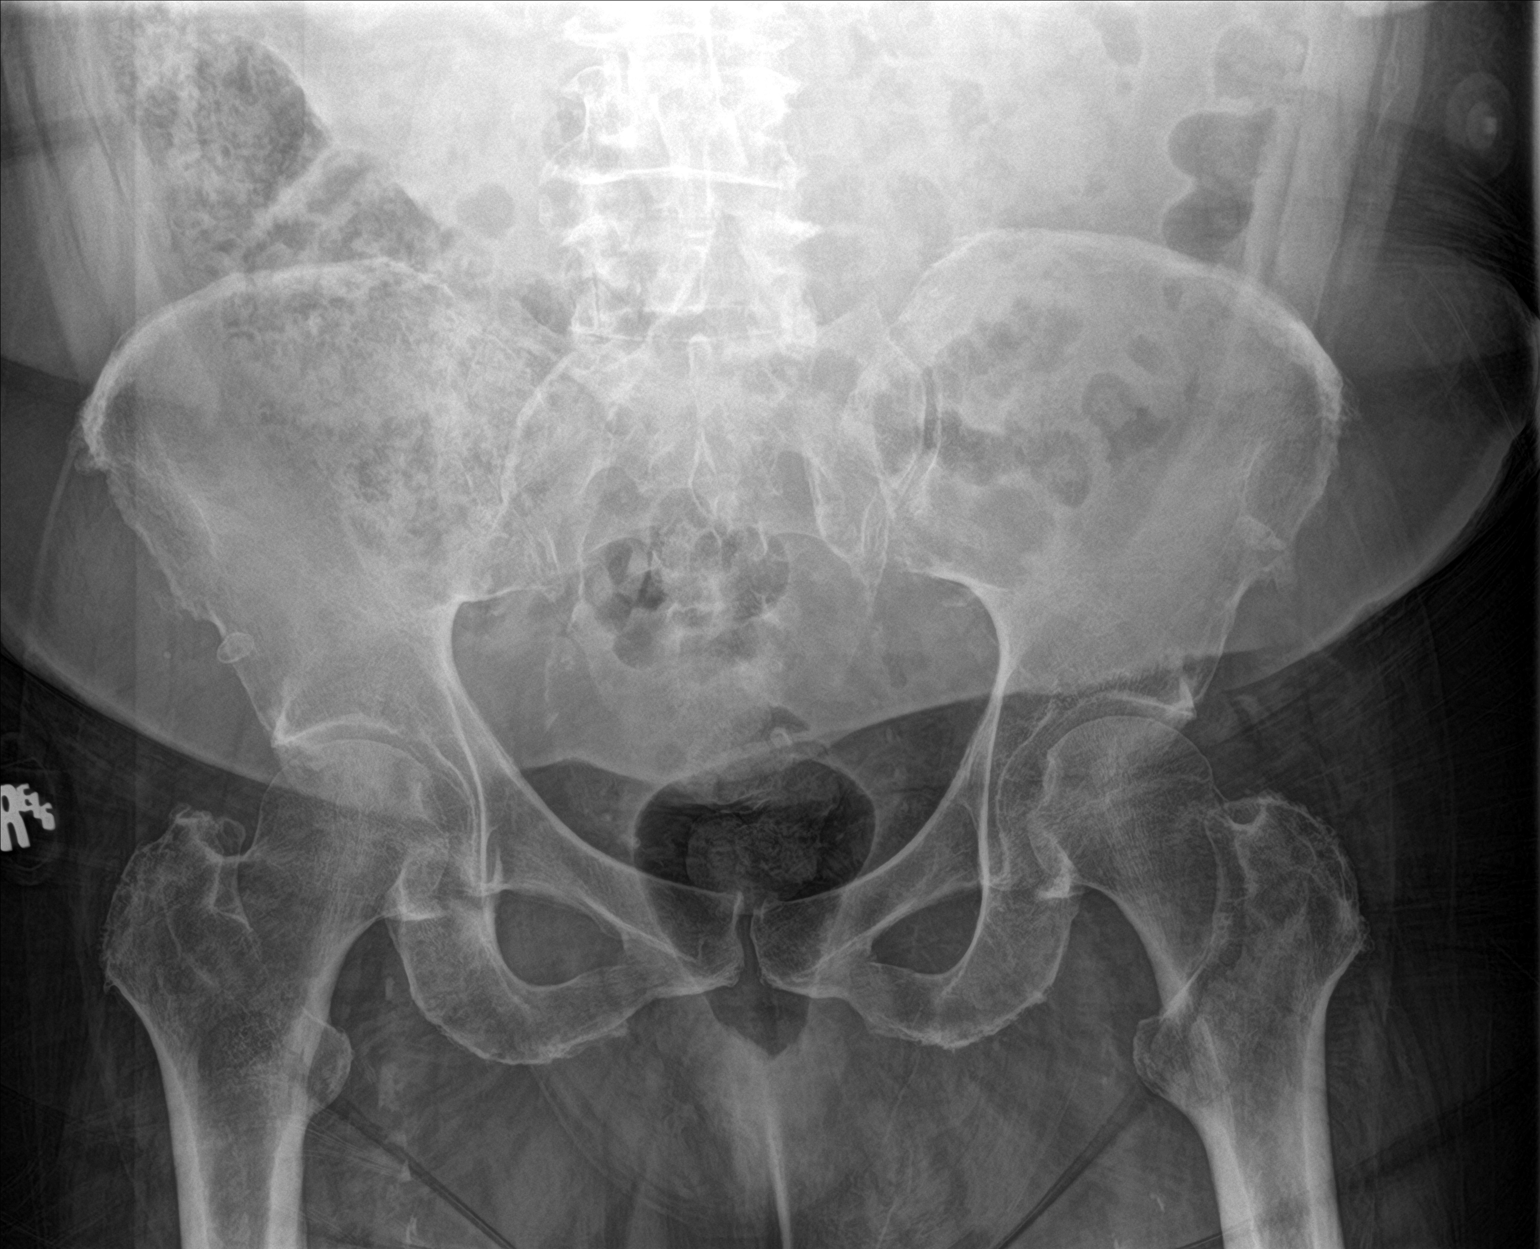

[3 of 3 positions shown; findings below may reference images not displayed]

FINDINGS: There is a proximal left femoral fracture noted at and just below
the intertrochanteric region. Minimal displacement. No angulation.
No subluxation or dislocation.
IMPRESSION: Left intertrochanteric/proximal femoral shaft fracture.

## 2022-07-08 IMAGING — RF DG C-ARM 1-60 MIN
1 series · 6 of 6 positions shown · non-contrast
Comparison: Radiograph earlier today.

CLINICAL DATA: Left trochanteric nail.

EXAM:
OPERATIVE LEFT HIP (WITH PELVIS IF PERFORMED)
TECHNIQUE: Fluoroscopic spot image(s) were submitted for interpretation
post-operatively.

[Series 1: run · 6 of 6 slices shown]
[im 1/6]
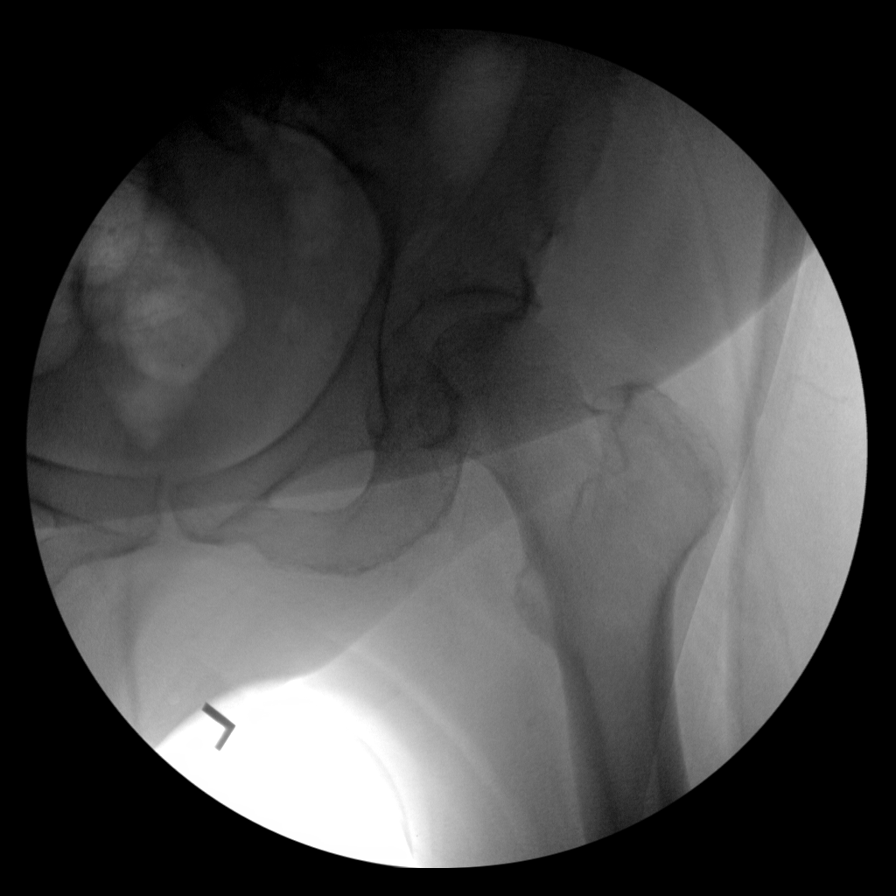
[im 2/6]
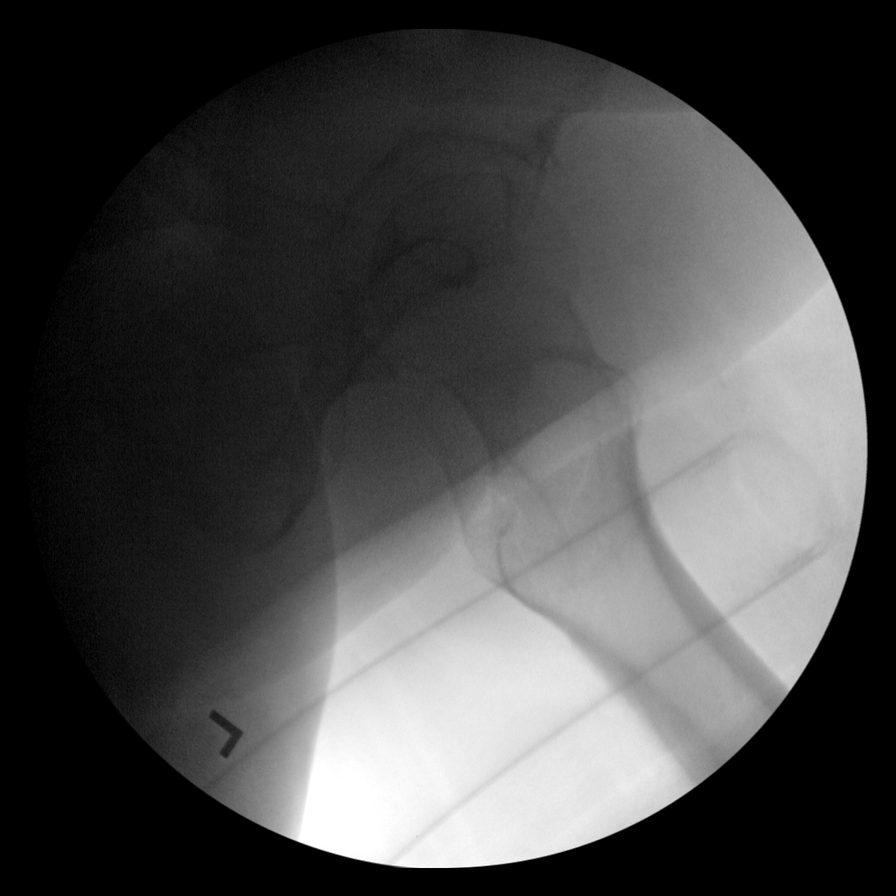
[im 3/6]
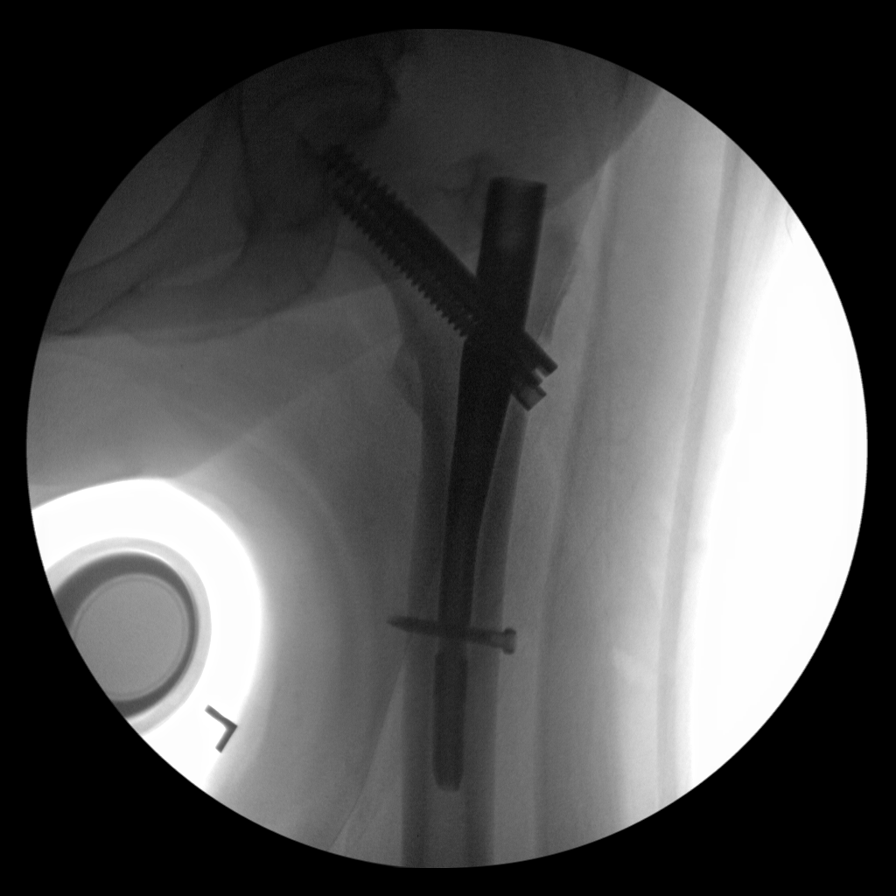
[im 4/6]
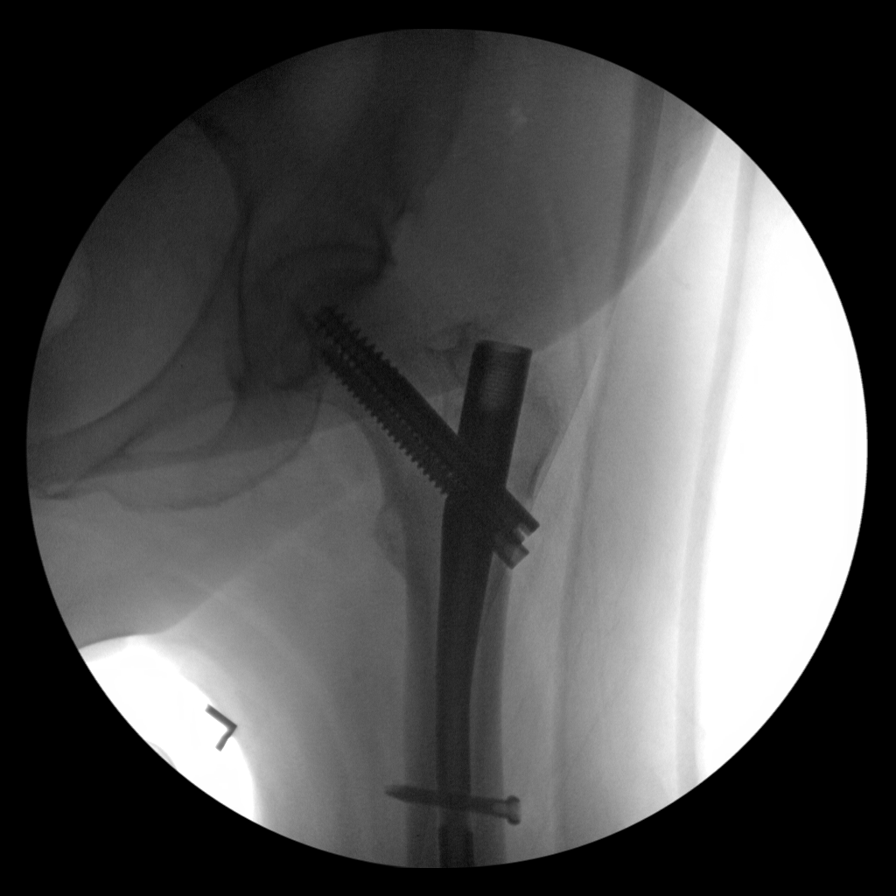
[im 5/6]
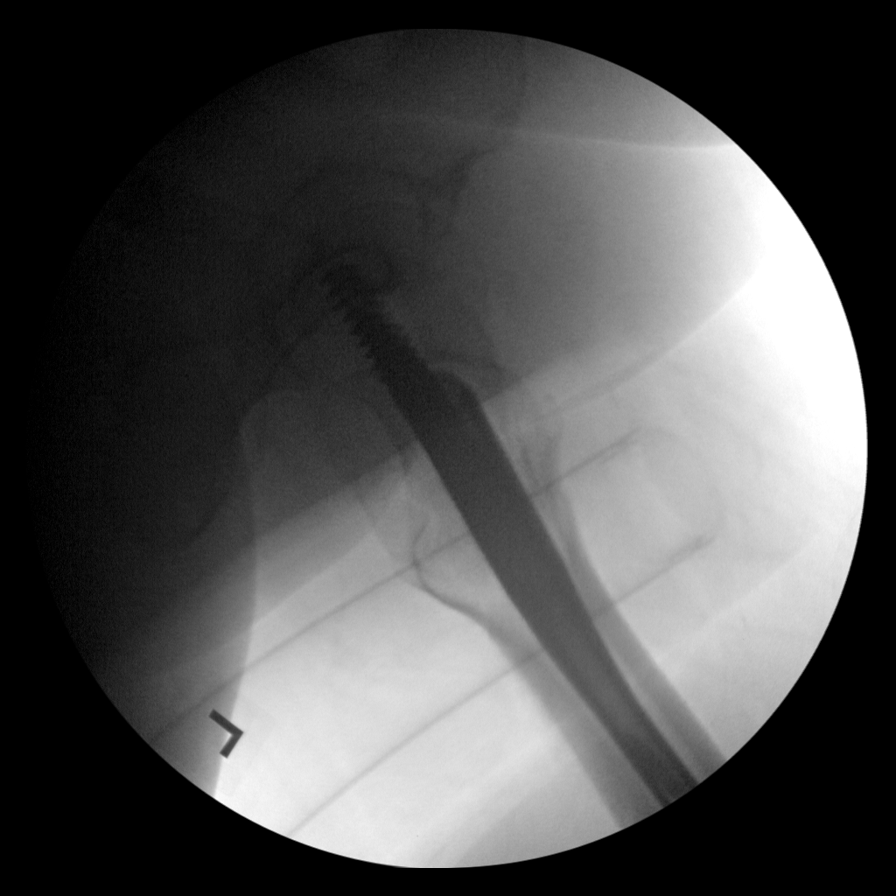
[im 6/6]
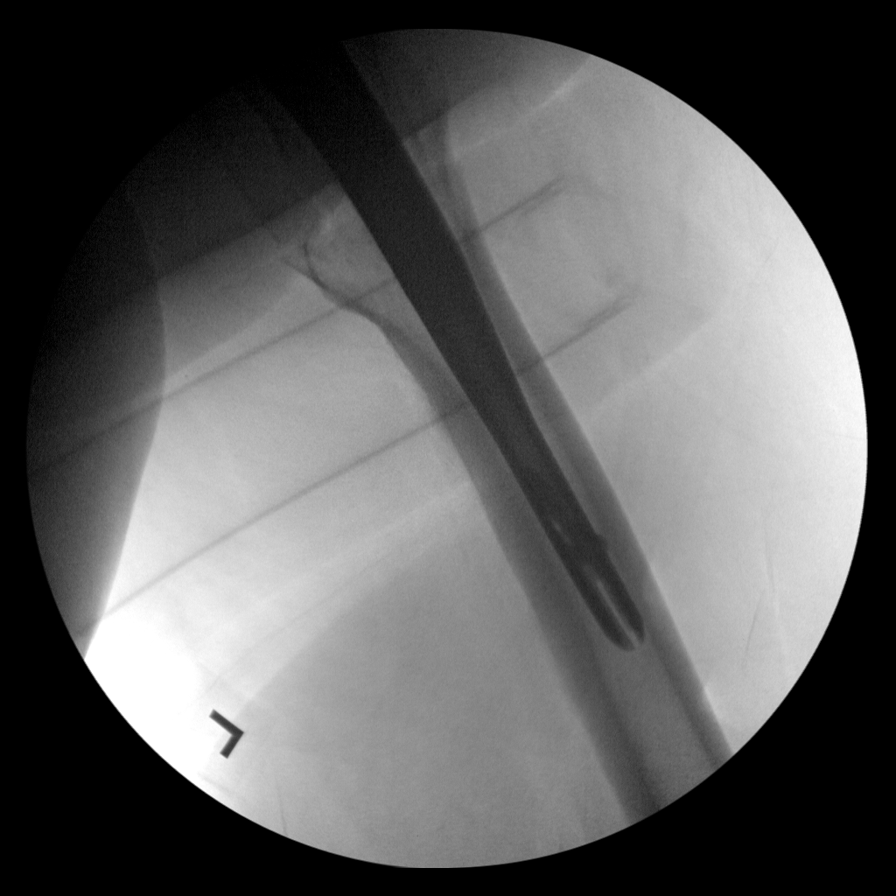

[6 of 6 positions shown; findings below may reference images not displayed]

FINDINGS: Six fluoroscopic spot views of the left hip obtained in frontal and
lateral projections. Intramedullary nail with distal locking and 2
trans trochanteric screws traverse intertrochanteric femur fracture.
Fluoroscopy time 1 minutes 40 seconds. Dose 24.48 mGy.
IMPRESSION: Procedural fluoroscopy during intramedullary nail fixation of
trochanteric femur fracture.

## 2022-07-28 DIAGNOSIS — E119 Type 2 diabetes mellitus without complications: Secondary | ICD-10-CM | POA: Diagnosis not present

## 2022-07-30 ENCOUNTER — Other Ambulatory Visit: Payer: Medicare HMO

## 2022-08-02 DIAGNOSIS — E119 Type 2 diabetes mellitus without complications: Secondary | ICD-10-CM | POA: Diagnosis not present

## 2022-08-02 DIAGNOSIS — H2513 Age-related nuclear cataract, bilateral: Secondary | ICD-10-CM | POA: Diagnosis not present

## 2022-08-02 DIAGNOSIS — H31091 Other chorioretinal scars, right eye: Secondary | ICD-10-CM | POA: Diagnosis not present

## 2022-08-02 DIAGNOSIS — H25013 Cortical age-related cataract, bilateral: Secondary | ICD-10-CM | POA: Diagnosis not present

## 2022-08-02 LAB — HM DIABETES EYE EXAM

## 2022-08-05 ENCOUNTER — Other Ambulatory Visit: Payer: Medicare HMO

## 2022-08-05 DIAGNOSIS — D509 Iron deficiency anemia, unspecified: Secondary | ICD-10-CM

## 2022-08-05 DIAGNOSIS — E119 Type 2 diabetes mellitus without complications: Secondary | ICD-10-CM | POA: Diagnosis not present

## 2022-08-06 ENCOUNTER — Other Ambulatory Visit: Payer: Self-pay

## 2022-08-06 DIAGNOSIS — D508 Other iron deficiency anemias: Secondary | ICD-10-CM

## 2022-08-06 LAB — CBC WITH DIFFERENTIAL/PLATELET
Absolute Monocytes: 378 cells/uL (ref 200–950)
Basophils Absolute: 30 cells/uL (ref 0–200)
Basophils Relative: 0.5 %
Eosinophils Absolute: 150 cells/uL (ref 15–500)
Eosinophils Relative: 2.5 %
HCT: 34.9 % — ABNORMAL LOW (ref 35.0–45.0)
Hemoglobin: 10.8 g/dL — ABNORMAL LOW (ref 11.7–15.5)
Lymphs Abs: 1590 cells/uL (ref 850–3900)
MCH: 23.6 pg — ABNORMAL LOW (ref 27.0–33.0)
MCHC: 30.9 g/dL — ABNORMAL LOW (ref 32.0–36.0)
MCV: 76.4 fL — ABNORMAL LOW (ref 80.0–100.0)
MPV: 11 fL (ref 7.5–12.5)
Monocytes Relative: 6.3 %
Neutro Abs: 3852 cells/uL (ref 1500–7800)
Neutrophils Relative %: 64.2 %
Platelets: 251 10*3/uL (ref 140–400)
RBC: 4.57 10*6/uL (ref 3.80–5.10)
RDW: 16.3 % — ABNORMAL HIGH (ref 11.0–15.0)
Total Lymphocyte: 26.5 %
WBC: 6 10*3/uL (ref 3.8–10.8)

## 2022-08-06 LAB — IRON: Iron: 22 ug/dL — ABNORMAL LOW (ref 45–160)

## 2022-08-06 MED ORDER — FERROUS SULFATE 325 (65 FE) MG PO TABS: 325.0000 mg | ORAL_TABLET | Freq: Every day | ORAL | 0 refills | Status: DC

## 2022-08-07 ENCOUNTER — Other Ambulatory Visit: Payer: Self-pay | Admitting: Family Medicine

## 2022-08-09 NOTE — Telephone Encounter (Signed)
Refilled 08/06/2022 #30 0 refills. Requested Prescriptions  Pending Prescriptions Disp Refills  . FEROSUL 325 (65 Fe) MG tablet [Pharmacy Med Name: FEROSUL 325 (65 Fe) MG Tablet] 30 tablet 0    Sig: TAKE 1 TABLET EVERY DAY WITH BREAKFAST     Endocrinology:  Minerals - Iron Supplementation Failed - 08/07/2022  8:10 AM      Failed - HGB in normal range and within 360 days    Hemoglobin  Date Value Ref Range Status  08/05/2022 10.8 (L) 11.7 - 15.5 g/dL Final  12/01/2021 10.5 (L) 12.0 - 15.0 g/dL Final         Failed - HCT in normal range and within 360 days    HCT  Date Value Ref Range Status  08/05/2022 34.9 (L) 35.0 - 45.0 % Final         Failed - Fe (serum) in normal range and within 360 days    Iron  Date Value Ref Range Status  08/05/2022 22 (L) 45 - 160 mcg/dL Final   %SAT  Date Value Ref Range Status  09/03/2021 9 (L) 16 - 45 % (calc) Final   Saturation Ratios  Date Value Ref Range Status  12/01/2021 7 (L) 10.4 - 31.8 % Final         Passed - RBC in normal range and within 360 days    RBC  Date Value Ref Range Status  08/05/2022 4.57 3.80 - 5.10 Million/uL Final         Passed - Ferritin in normal range and within 360 days    Ferritin  Date Value Ref Range Status  12/01/2021 34 11 - 307 ng/mL Final    Comment:    Performed at [facility], [address], [place], [place] [address]         Passed - Valid encounter within last 12 months    Recent Outpatient Visits          9 months ago COPD exacerbation ([place])   [place] Susy Frizzle, MD   11 months ago Type 2 diabetes mellitus without complication, unspecified whether long term insulin use ([place])   [employer] Susy Frizzle, MD   3 years ago Type 2 diabetes mellitus with other specified complication, unspecified whether long term insulin use ([place])   [place] Pickard, Cammie Mcgee, MD   3 years ago Autonomic neuropathy    [place], Cammie Mcgee, MD   3 years ago Orthostatic hypotension   [place] Pickard, Cammie Mcgee, MD

## 2022-08-13 ENCOUNTER — Encounter: Payer: Self-pay | Admitting: Family Medicine

## 2022-08-18 ENCOUNTER — Other Ambulatory Visit: Payer: Self-pay | Admitting: Family Medicine

## 2022-08-24 ENCOUNTER — Telehealth: Payer: Self-pay | Admitting: Family Medicine

## 2022-08-24 NOTE — Telephone Encounter (Signed)
Patient called and asked which pharmacy is she using because the [facility] was sent to [facility] on 08/19/22 and [facility] has [facility]ill [facility]. She says she uses [facility], but [facility] pickup. She says she hasn't received the one from [facility] yet. I advised I will call [facility] to straighten this out for her. [facility] called and spoke to [place], [facility] about the refill(s) ferosul requested via fax. Advised it was sent to [facility] on 08/19/22 and that the patient wants her medication to go to [facility]. She says they did request it, but it has been put back, so it's not available anymore. [facility] called and spoke to [place], Technician about the Rx sent on 08/19/22, she says it was delivered a 60 day supply at 1040 to the patient's home today along with Olmesartan.

## 2022-08-24 NOTE — Telephone Encounter (Signed)
Received efax from pharmacy to request refill of  FEROSUL 325 (65 Fe) MG tablet [[id number]]   LOV: 06/11/2022  Pharmacy:  [id number] [address] [place], [place] [address] Phone: [phone] Fax: [phone]  Please advise pharmacist at [phone]

## 2022-09-01 ENCOUNTER — Encounter: Payer: Self-pay | Admitting: Gastroenterology

## 2022-09-02 DIAGNOSIS — Z89511 Acquired absence of right leg below knee: Secondary | ICD-10-CM | POA: Diagnosis not present

## 2022-09-30 ENCOUNTER — Ambulatory Visit: Payer: Medicare HMO

## 2022-10-03 ENCOUNTER — Emergency Department (HOSPITAL_COMMUNITY): Payer: Medicare HMO

## 2022-10-03 ENCOUNTER — Other Ambulatory Visit: Payer: Self-pay

## 2022-10-03 ENCOUNTER — Emergency Department (HOSPITAL_COMMUNITY)
Admission: EM | Admit: 2022-10-03 | Discharge: 2022-10-03 | Disposition: A | Discharge status: Home / Self Care | Payer: Medicare HMO | Attending: Emergency Medicine | Admitting: Emergency Medicine

## 2022-10-03 DIAGNOSIS — Z79899 Other long term (current) drug therapy: Secondary | ICD-10-CM | POA: Insufficient documentation

## 2022-10-03 DIAGNOSIS — R1084 Generalized abdominal pain: Secondary | ICD-10-CM | POA: Diagnosis not present

## 2022-10-03 DIAGNOSIS — R739 Hyperglycemia, unspecified: Secondary | ICD-10-CM | POA: Diagnosis not present

## 2022-10-03 DIAGNOSIS — K8020 Calculus of gallbladder without cholecystitis without obstruction: Secondary | ICD-10-CM | POA: Diagnosis not present

## 2022-10-03 DIAGNOSIS — Z7952 Long term (current) use of systemic steroids: Secondary | ICD-10-CM | POA: Insufficient documentation

## 2022-10-03 DIAGNOSIS — I10 Essential (primary) hypertension: Secondary | ICD-10-CM | POA: Diagnosis not present

## 2022-10-03 DIAGNOSIS — Z7982 Long term (current) use of aspirin: Secondary | ICD-10-CM | POA: Diagnosis not present

## 2022-10-03 DIAGNOSIS — R1011 Right upper quadrant pain: Secondary | ICD-10-CM | POA: Diagnosis not present

## 2022-10-03 LAB — COMPREHENSIVE METABOLIC PANEL
ALT: 9 U/L (ref 0–44)
AST: 23 U/L (ref 15–41)
Albumin: 2.9 g/dL — ABNORMAL LOW (ref 3.5–5.0)
Alkaline Phosphatase: 72 U/L (ref 38–126)
Anion gap: 6 (ref 5–15)
BUN: 12 mg/dL (ref 8–23)
CO2: 25 mmol/L (ref 22–32)
Calcium: 8.4 mg/dL — ABNORMAL LOW (ref 8.9–10.3)
Chloride: 106 mmol/L (ref 98–111)
Creatinine, Ser: 0.64 mg/dL (ref 0.44–1.00)
GFR, Estimated: >60 mL/min (ref >60)
Glucose, Bld: 209 mg/dL — ABNORMAL HIGH (ref 70–99)
Potassium: 3.8 mmol/L (ref 3.5–5.1)
Sodium: 137 mmol/L (ref 135–145)
Total Bilirubin: <0.1 mg/dL — ABNORMAL LOW (ref 0.3–1.2)
Total Protein: 5.5 g/dL — ABNORMAL LOW (ref 6.5–8.1)

## 2022-10-03 LAB — LIPASE, BLOOD: Lipase: 65 U/L — ABNORMAL HIGH (ref 11–51)

## 2022-10-03 LAB — CBC
HCT: 32.8 % — ABNORMAL LOW (ref 36.0–46.0)
Hemoglobin: 9.6 g/dL — ABNORMAL LOW (ref 12.0–15.0)
MCH: 23.7 pg — ABNORMAL LOW (ref 26.0–34.0)
MCHC: 29.3 g/dL — ABNORMAL LOW (ref 30.0–36.0)
MCV: 81 fL (ref 80.0–100.0)
Platelets: 237 10*3/uL (ref 150–400)
RBC: 4.05 MIL/uL (ref 3.87–5.11)
RDW: 17.9 % — ABNORMAL HIGH (ref 11.5–15.5)
WBC: 5.8 10*3/uL (ref 4.0–10.5)
nRBC: 0 % (ref 0.0–0.2)

## 2022-10-03 MED ORDER — ONDANSETRON HCL 4 MG/2ML IJ SOLN
4.0000 mg | Freq: Once | INTRAMUSCULAR | Status: AC
  Administered 2022-10-03: 4 mg via INTRAVENOUS
  Filled 2022-10-03: qty 2

## 2022-10-03 MED ORDER — FENTANYL CITRATE PF 50 MCG/ML IJ SOSY
50.0000 ug | PREFILLED_SYRINGE | INTRAMUSCULAR | Status: DC | PRN
  Administered 2022-10-03: 50 ug via INTRAVENOUS
  Filled 2022-10-03: qty 1

## 2022-10-03 NOTE — ED Provider Notes (Signed)
[facility] EMERGENCY DEPARTMENT Provider Note   CSN: [id number] Arrival date & time: 10/03/22  1536     History  Chief Complaint  Patient presents with   Abdominal Pain    Pamela Cherry is a 71 y.o. female.  HPI    71 year old female comes in with chief complaint of abdominal pain. Patient states that she has been having some abdominal discomfort in the upper quadrant for the last few weeks.  The pain has been off and on.  However today, she had sudden onset severe abdominal pain that was excruciating and not resolving.  Pain is located in the right upper quadrant and epigastric region.  She denies any associated nausea, vomiting, diarrhea.  She has no history of abdominal surgeries, kidney stone and denies any UTI-like symptoms.  Pain is improved during the time of ER assessment compared to intensity of the pain at the onset.  Home Medications Prior to Admission medications   Medication Sig Start Date End Date Taking? Authorizing Provider  albuterol (VENTOLIN HFA) 108 (90 Base) MCG/ACT inhaler Inhale 2 puffs into the lungs every 6 (six) hours as needed for wheezing or shortness of breath. Patient not taking: Reported on 06/11/2022 10/19/21   Susy Frizzle, MD  aspirin 325 MG tablet Take 325 mg by mouth daily.    [provider]  azithromycin (ZITHROMAX) 250 MG tablet 2 tabs poqday1, 1 tab poqday 2-5 Patient not taking: Reported on 06/11/2022 10/19/21   Susy Frizzle, MD  calcium carbonate (OS-CAL) 600 MG TABS tablet Take 600 mg by mouth daily.     [provider]  celecoxib (CELEBREX) 100 MG capsule Take 100 mg by mouth daily. 11/25/20   [provider]  clorazepate (TRANXENE) 7.5 MG tablet Take 1 tablet (7.5 mg total) by mouth at bedtime as needed for anxiety or sleep. 11/02/17   Susy Frizzle, MD  FEROSUL 325 (65 Fe) MG tablet TAKE 1 TABLET EVERY DAY WITH BREAKFAST 08/19/22   Susy Frizzle, MD  gabapentin (NEURONTIN) 300  MG capsule Take 1 capsule (300 mg total) by mouth 3 (three) times daily. 09/03/21   Susy Frizzle, MD  glipiZIDE (GLUCOTROL XL) 10 MG 24 hr tablet Take 1 tablet (10 mg total) by mouth daily with breakfast. 08/28/21   Susy Frizzle, MD  HYDROcodone-acetaminophen (NORCO) 7.5-325 MG tablet Take 1 tablet by mouth every 6 (six) hours as needed for severe pain. 02/27/21   Corinne Ports, PA-C  hydrOXYzine (ATARAX/VISTARIL) 25 MG tablet Take 1 tablet (25 mg total) by mouth at bedtime as needed. 09/03/21   Susy Frizzle, MD  levocetirizine (XYZAL) 5 MG tablet Take 1 tablet (5 mg total) by mouth daily. 01/31/19   Susy Frizzle, MD  levothyroxine (SYNTHROID) 137 MCG tablet Take 1 tablet (137 mcg total) by mouth daily before breakfast. 06/16/22   Susy Frizzle, MD  metFORMIN (GLUCOPHAGE) 1000 MG tablet Take 1 tablet (1,000 mg total) by mouth 2 (two) times daily with a meal. 09/03/21   Susy Frizzle, MD  Multiple Vitamin (MULTI-VITAMINS) TABS Take 1 tablet by mouth daily.     [provider]  olmesartan (BENICAR) 40 MG tablet TAKE 1 TABLET EVERY DAY 08/19/22   Susy Frizzle, MD  pioglitazone (ACTOS) 30 MG tablet Take 1 tablet (30 mg total) by mouth daily. 08/28/21   Susy Frizzle, MD  predniSONE (DELTASONE) 20 MG tablet 3 tabs poqday 1-2, 2 tabs poqday 3-4, 1  tab poqday 5-6 Patient not taking: Reported on 06/11/2022 10/19/21   Susy Frizzle, MD  rosuvastatin (CRESTOR) 20 MG tablet Take 1 tablet (20 mg total) by mouth daily. 10/10/19   Susy Frizzle, MD      Allergies    Other    Review of Systems   Review of Systems  All other systems reviewed and are negative.   Physical Exam Updated Vital Signs BP (!) 134/55   Pulse 71   Temp 98.6 F (37 C) (Oral)   Resp 14   Ht '5\' 4"'$  (1.626 m)   Wt 97.5 kg   SpO2 97%   BMI 36.90 kg/m  Physical Exam Vitals and nursing note reviewed.  Constitutional:      Appearance: She is well-developed.  HENT:     Head:  Atraumatic.  Cardiovascular:     Rate and Rhythm: Normal rate.  Pulmonary:     Effort: Pulmonary effort is normal.  Abdominal:     Tenderness: There is abdominal tenderness. Positive signs include Murphy's sign.  Musculoskeletal:     Cervical back: Normal range of motion and neck supple.  Skin:    General: Skin is warm and dry.  Neurological:     Mental Status: She is alert and oriented to person, place, and time.     ED Results / Procedures / Treatments   Labs (all labs ordered are listed, but only abnormal results are displayed) Labs Reviewed  LIPASE, BLOOD - Abnormal; Notable for the following components:      Result Value   Lipase 65 (*)    All other components within normal limits  COMPREHENSIVE METABOLIC PANEL - Abnormal; Notable for the following components:   Glucose, Bld 209 (*)    Calcium 8.4 (*)    Total Protein 5.5 (*)    Albumin 2.9 (*)    Total Bilirubin <0.1 (*)    All other components within normal limits  CBC - Abnormal; Notable for the following components:   Hemoglobin 9.6 (*)    HCT 32.8 (*)    MCH 23.7 (*)    MCHC 29.3 (*)    RDW 17.9 (*)    All other components within normal limits  URINALYSIS, ROUTINE W REFLEX MICROSCOPIC    EKG None  Radiology US Abdomen Limited RUQ (LIVER/GB)  Result Date: 10/03/2022 CLINICAL DATA:  RIGHT upper quadrant pain EXAM: ULTRASOUND ABDOMEN LIMITED RIGHT UPPER QUADRANT COMPARISON:  None Available. FINDINGS: Gallbladder: Multiple gallstones. Largest stone measures 1.4 cm. No gallbladder wall thickening or pericholecystic fluid. No sonographic Murphy's sign elicited during the exam. Common bile duct: Diameter: 3 mm Liver: Within normal limits in parenchymal echogenicity. No acute or suspicious findings within the liver. Portal vein is patent on color Doppler imaging with normal direction of blood flow towards the liver. Other: None. IMPRESSION: 1. No acute findings. Cholelithiasis without evidence of acute cholecystitis.  2. Liver is unremarkable. Electronically Signed   By: Franki Cabot M.D.   On: 10/03/2022 17:38    Procedures Procedures    Medications Ordered in ED Medications  fentaNYL (SUBLIMAZE) injection 50 mcg (50 mcg Intravenous Given 10/03/22 1728)  ondansetron (ZOFRAN) injection 4 mg (4 mg Intravenous Given 10/03/22 1727)    ED Course/ Medical Decision Making/ A&P                           Medical Decision Making This patient presents to the ED with chief complaint(s) of abdominal  pain in the right upper quadrant with pertinent past medical history of no previous abdominal surgical history.The complaint involves an extensive differential diagnosis and also carries with it a high risk of complications and morbidity.    The differential diagnosis includes : Pancreatitis, Hepatobiliary pathology including cholelithiasis and cholecystitis, Gastritis/peptic ulcer disease, small bowel obstruction, Acute coronary syndrome, Aortic Dissection   No clinical concerns for ACS.  The initial plan is to get basic labs and ultrasound right upper quadrant.   Independent labs interpretation:  The following labs were independently interpreted: Slightly elevated lipase, LFTs are normal.  Independent visualization and interpretation of imaging: - I independently visualized the following imaging with scope of interpretation limited to determining acute life threatening conditions related to emergency care: Ultrasound right upper quadrant, which revealed no clear signs of pericholecystic fluid.  Patient does have gallstones.  Radiology read is positive for cholelithiasis.  Treatment and Reassessment: Patient reassessed after the ultrasound.  She feels a lot better.  However on exam, she still has right upper quadrant tenderness. We will discuss the case with general surgery.  Consultation: - Consulted or discussed management/test interpretation with external professional: Dr. Grandville Silos, general surgery.  He  recommends that if the patient's pain at the rest is tolerable, then we can discharge her with outpatient follow-up.  Patient agreeable with the plan.  Strict ER return precautions have been discussed, and patient is agreeing with the plan and is comfortable with the workup done and the recommendations from the ER.   Consideration for admission or further workup: Cleared by general surgery for outpatient follow-up.    Problems Addressed: Calculus of gallbladder without cholecystitis without obstruction: acute illness or injury with systemic symptoms  Amount and/or Complexity of Data Reviewed Labs: ordered. Radiology: ordered.  Risk Prescription drug management.    Final Clinical Impression(s) / ED Diagnoses Final diagnoses:  Calculus of gallbladder without cholecystitis without obstruction    Rx / DC Orders ED Discharge Orders     None         Varney Biles, MD 10/03/22 1839

## 2022-10-03 NOTE — ED Triage Notes (Signed)
Pt arrived via [facility] for RUQ abdominal pain from home. Pt reports chronic constipation, last bowel movement yesterday. Reports sharp upper right quadrant pain that has been occurring on and off for the last few weeks, worsening today with sudden onset while sitting at computer. Abdomen soft, tender to palpation on RUQ. A&Ox4, GCS 15.   PTA EMS Vitals  BP 160/80 HR 86 SPO2 96% RA RR 18 CBG 303

## 2022-10-03 NOTE — Discharge Instructions (Signed)
You were seen in the ER for abdominal pain.  The ultrasound confirms that you have gallstones. Please read the instructions provided.  Please call the general surgeons for a follow-up appointment in 7 to 10 days.  Please return to the ER if your symptoms worsen; you have increased pain, fevers, chills, inability to keep any medications down, confusion. Otherwise see the outpatient doctor as requested.

## 2022-10-05 ENCOUNTER — Ambulatory Visit (INDEPENDENT_AMBULATORY_CARE_PROVIDER_SITE_OTHER): Payer: Medicare HMO

## 2022-10-05 VITALS — Ht 64.0 in | Wt 208.0 lb

## 2022-10-05 DIAGNOSIS — Z23 Encounter for immunization: Secondary | ICD-10-CM | POA: Diagnosis not present

## 2022-10-05 DIAGNOSIS — E119 Type 2 diabetes mellitus without complications: Secondary | ICD-10-CM

## 2022-10-07 ENCOUNTER — Telehealth: Payer: Self-pay

## 2022-10-07 NOTE — Telephone Encounter (Signed)
     Patient  visit on 10/22   at [facility]   Have you been able to follow up with your primary care physician? yes  The patient was or was not able to obtain any needed medicine or equipment. yes  Are there diet recommendations that you are having difficulty following? na  Patient expresses understanding of discharge instructions and education provided has no other needs at this time. yes     [place], Care Management  [phone] [address], [place], [place] [address] Phone: [phone] Email: [email or web address]

## 2022-10-10 ENCOUNTER — Other Ambulatory Visit: Payer: Self-pay | Admitting: Family Medicine

## 2022-10-11 NOTE — Telephone Encounter (Signed)
Requested Prescriptions  Pending Prescriptions Disp Refills  . FEROSUL 325 (65 Fe) MG tablet [Pharmacy Med Name: FEROSUL 325 (65 Fe) MG Tablet] 90 tablet 2    Sig: TAKE 1 TABLET EVERY DAY WITH BREAKFAST     Endocrinology:  Minerals - Iron Supplementation Failed - 10/10/2022  4:00 AM      Failed - HGB in normal range and within 360 days    Hemoglobin  Date Value Ref Range Status  10/03/2022 9.6 (L) 12.0 - 15.0 g/dL Final  12/01/2021 10.5 (L) 12.0 - 15.0 g/dL Final         Failed - HCT in normal range and within 360 days    HCT  Date Value Ref Range Status  10/03/2022 32.8 (L) 36.0 - 46.0 % Final         Failed - Fe (serum) in normal range and within 360 days    Iron  Date Value Ref Range Status  08/05/2022 22 (L) 45 - 160 mcg/dL Final   %SAT  Date Value Ref Range Status  09/03/2021 9 (L) 16 - 45 % (calc) Final   Saturation Ratios  Date Value Ref Range Status  12/01/2021 7 (L) 10.4 - 31.8 % Final         Passed - RBC in normal range and within 360 days    RBC  Date Value Ref Range Status  10/03/2022 4.05 3.87 - 5.11 MIL/uL Final         Passed - Ferritin in normal range and within 360 days    Ferritin  Date Value Ref Range Status  12/01/2021 34 11 - 307 ng/mL Final    Comment:    Performed at [facility], [place]. [address] [place], [place] [address]         Passed - Valid encounter within last 12 months    Recent Outpatient Visits          11 months ago COPD exacerbation ([place])   [facility] Susy Frizzle, MD   1 year ago Type 2 diabetes mellitus without complication, unspecified whether long term insulin use ([place])   [place] Susy Frizzle, MD   3 years ago Type 2 diabetes mellitus with other specified complication, unspecified whether long term insulin use ([place])   [place] Pickard, Cammie Mcgee, MD   3 years ago Autonomic neuropathy   [place],  Cammie Mcgee, MD   3 years ago Orthostatic hypotension   [place] Pickard, Cammie Mcgee, MD

## 2022-10-12 ENCOUNTER — Inpatient Hospital Stay (HOSPITAL_COMMUNITY)
Admission: EM | Admit: 2022-10-12 | Discharge: 2022-10-19 | DRG: 473 | Disposition: A | Discharge status: Home Health | Payer: Medicare HMO | Attending: Internal Medicine | Admitting: Internal Medicine

## 2022-10-12 ENCOUNTER — Observation Stay (HOSPITAL_COMMUNITY): Payer: Medicare HMO

## 2022-10-12 ENCOUNTER — Encounter (HOSPITAL_COMMUNITY): Payer: Self-pay

## 2022-10-12 ENCOUNTER — Ambulatory Visit: Payer: Self-pay | Admitting: *Deleted

## 2022-10-12 ENCOUNTER — Other Ambulatory Visit: Payer: Self-pay

## 2022-10-12 ENCOUNTER — Emergency Department (HOSPITAL_COMMUNITY): Payer: Medicare HMO

## 2022-10-12 DIAGNOSIS — R200 Anesthesia of skin: Secondary | ICD-10-CM | POA: Diagnosis not present

## 2022-10-12 DIAGNOSIS — E785 Hyperlipidemia, unspecified: Secondary | ICD-10-CM | POA: Diagnosis present

## 2022-10-12 DIAGNOSIS — I152 Hypertension secondary to endocrine disorders: Secondary | ICD-10-CM | POA: Diagnosis present

## 2022-10-12 DIAGNOSIS — M25511 Pain in right shoulder: Secondary | ICD-10-CM | POA: Diagnosis not present

## 2022-10-12 DIAGNOSIS — Z8249 Family history of ischemic heart disease and other diseases of the circulatory system: Secondary | ICD-10-CM | POA: Diagnosis not present

## 2022-10-12 DIAGNOSIS — E669 Obesity, unspecified: Secondary | ICD-10-CM | POA: Diagnosis present

## 2022-10-12 DIAGNOSIS — M47812 Spondylosis without myelopathy or radiculopathy, cervical region: Secondary | ICD-10-CM | POA: Diagnosis not present

## 2022-10-12 DIAGNOSIS — Z89511 Acquired absence of right leg below knee: Secondary | ICD-10-CM | POA: Diagnosis not present

## 2022-10-12 DIAGNOSIS — R0689 Other abnormalities of breathing: Secondary | ICD-10-CM | POA: Diagnosis not present

## 2022-10-12 DIAGNOSIS — R509 Fever, unspecified: Secondary | ICD-10-CM | POA: Diagnosis not present

## 2022-10-12 DIAGNOSIS — R1310 Dysphagia, unspecified: Secondary | ICD-10-CM | POA: Diagnosis present

## 2022-10-12 DIAGNOSIS — E890 Postprocedural hypothyroidism: Secondary | ICD-10-CM | POA: Diagnosis present

## 2022-10-12 DIAGNOSIS — I639 Cerebral infarction, unspecified: Secondary | ICD-10-CM

## 2022-10-12 DIAGNOSIS — R9431 Abnormal electrocardiogram [ECG] [EKG]: Secondary | ICD-10-CM | POA: Diagnosis not present

## 2022-10-12 DIAGNOSIS — E119 Type 2 diabetes mellitus without complications: Secondary | ICD-10-CM

## 2022-10-12 DIAGNOSIS — Z720 Tobacco use: Secondary | ICD-10-CM | POA: Diagnosis not present

## 2022-10-12 DIAGNOSIS — Z716 Tobacco abuse counseling: Secondary | ICD-10-CM

## 2022-10-12 DIAGNOSIS — Z7982 Long term (current) use of aspirin: Secondary | ICD-10-CM | POA: Diagnosis not present

## 2022-10-12 DIAGNOSIS — R531 Weakness: Secondary | ICD-10-CM | POA: Diagnosis not present

## 2022-10-12 DIAGNOSIS — M4712 Other spondylosis with myelopathy, cervical region: Secondary | ICD-10-CM | POA: Diagnosis present

## 2022-10-12 DIAGNOSIS — Z91048 Other nonmedicinal substance allergy status: Secondary | ICD-10-CM

## 2022-10-12 DIAGNOSIS — Z791 Long term (current) use of non-steroidal anti-inflammatories (NSAID): Secondary | ICD-10-CM | POA: Diagnosis not present

## 2022-10-12 DIAGNOSIS — I10 Essential (primary) hypertension: Secondary | ICD-10-CM | POA: Diagnosis not present

## 2022-10-12 DIAGNOSIS — F17200 Nicotine dependence, unspecified, uncomplicated: Secondary | ICD-10-CM | POA: Diagnosis not present

## 2022-10-12 DIAGNOSIS — Z79890 Hormone replacement therapy (postmenopausal): Secondary | ICD-10-CM | POA: Diagnosis not present

## 2022-10-12 DIAGNOSIS — Z6835 Body mass index (BMI) 35.0-35.9, adult: Secondary | ICD-10-CM | POA: Diagnosis not present

## 2022-10-12 DIAGNOSIS — R29818 Other symptoms and signs involving the nervous system: Principal | ICD-10-CM

## 2022-10-12 DIAGNOSIS — M4802 Spinal stenosis, cervical region: Secondary | ICD-10-CM | POA: Diagnosis present

## 2022-10-12 DIAGNOSIS — Z7984 Long term (current) use of oral hypoglycemic drugs: Secondary | ICD-10-CM | POA: Diagnosis not present

## 2022-10-12 DIAGNOSIS — E1169 Type 2 diabetes mellitus with other specified complication: Secondary | ICD-10-CM | POA: Diagnosis present

## 2022-10-12 DIAGNOSIS — Z8673 Personal history of transient ischemic attack (TIA), and cerebral infarction without residual deficits: Secondary | ICD-10-CM | POA: Diagnosis not present

## 2022-10-12 DIAGNOSIS — G992 Myelopathy in diseases classified elsewhere: Secondary | ICD-10-CM | POA: Diagnosis not present

## 2022-10-12 DIAGNOSIS — F17210 Nicotine dependence, cigarettes, uncomplicated: Secondary | ICD-10-CM | POA: Diagnosis present

## 2022-10-12 DIAGNOSIS — Z79899 Other long term (current) drug therapy: Secondary | ICD-10-CM

## 2022-10-12 DIAGNOSIS — E1159 Type 2 diabetes mellitus with other circulatory complications: Secondary | ICD-10-CM | POA: Diagnosis present

## 2022-10-12 DIAGNOSIS — E039 Hypothyroidism, unspecified: Secondary | ICD-10-CM | POA: Diagnosis not present

## 2022-10-12 DIAGNOSIS — M19012 Primary osteoarthritis, left shoulder: Secondary | ICD-10-CM | POA: Diagnosis not present

## 2022-10-12 DIAGNOSIS — I6782 Cerebral ischemia: Secondary | ICD-10-CM | POA: Diagnosis not present

## 2022-10-12 DIAGNOSIS — Z833 Family history of diabetes mellitus: Secondary | ICD-10-CM

## 2022-10-12 DIAGNOSIS — S46012A Strain of muscle(s) and tendon(s) of the rotator cuff of left shoulder, initial encounter: Secondary | ICD-10-CM | POA: Diagnosis not present

## 2022-10-12 DIAGNOSIS — M4322 Fusion of spine, cervical region: Secondary | ICD-10-CM | POA: Diagnosis not present

## 2022-10-12 DIAGNOSIS — E1149 Type 2 diabetes mellitus with other diabetic neurological complication: Secondary | ICD-10-CM | POA: Diagnosis not present

## 2022-10-12 DIAGNOSIS — I672 Cerebral atherosclerosis: Secondary | ICD-10-CM | POA: Diagnosis not present

## 2022-10-12 DIAGNOSIS — G959 Disease of spinal cord, unspecified: Secondary | ICD-10-CM | POA: Diagnosis not present

## 2022-10-12 DIAGNOSIS — G9520 Unspecified cord compression: Secondary | ICD-10-CM | POA: Diagnosis not present

## 2022-10-12 DIAGNOSIS — D638 Anemia in other chronic diseases classified elsewhere: Secondary | ICD-10-CM | POA: Diagnosis not present

## 2022-10-12 LAB — CBC
HCT: 36.8 % (ref 36.0–46.0)
Hemoglobin: 11.3 g/dL — ABNORMAL LOW (ref 12.0–15.0)
MCH: 24.1 pg — ABNORMAL LOW (ref 26.0–34.0)
MCHC: 30.7 g/dL (ref 30.0–36.0)
MCV: 78.5 fL — ABNORMAL LOW (ref 80.0–100.0)
Platelets: 236 10*3/uL (ref 150–400)
RBC: 4.69 MIL/uL (ref 3.87–5.11)
RDW: 18.4 % — ABNORMAL HIGH (ref 11.5–15.5)
WBC: 7.8 10*3/uL (ref 4.0–10.5)
nRBC: 0 % (ref 0.0–0.2)

## 2022-10-12 LAB — COMPREHENSIVE METABOLIC PANEL
ALT: 13 U/L (ref 0–44)
AST: 30 U/L (ref 15–41)
Albumin: 3.5 g/dL (ref 3.5–5.0)
Alkaline Phosphatase: 84 U/L (ref 38–126)
Anion gap: 13 (ref 5–15)
BUN: 12 mg/dL (ref 8–23)
CO2: 24 mmol/L (ref 22–32)
Calcium: 9.4 mg/dL (ref 8.9–10.3)
Chloride: 104 mmol/L (ref 98–111)
Creatinine, Ser: 0.78 mg/dL (ref 0.44–1.00)
GFR, Estimated: >60 mL/min (ref >60)
Glucose, Bld: 198 mg/dL — ABNORMAL HIGH (ref 70–99)
Potassium: 4.3 mmol/L (ref 3.5–5.1)
Sodium: 141 mmol/L (ref 135–145)
Total Bilirubin: 0.7 mg/dL (ref 0.3–1.2)
Total Protein: 6.6 g/dL (ref 6.5–8.1)

## 2022-10-12 LAB — DIFFERENTIAL
Abs Immature Granulocytes: 0.02 10*3/uL (ref 0.00–0.07)
Basophils Absolute: 0 10*3/uL (ref 0.0–0.1)
Basophils Relative: 0 %
Eosinophils Absolute: 0.1 10*3/uL (ref 0.0–0.5)
Eosinophils Relative: 2 %
Immature Granulocytes: 0 %
Lymphocytes Relative: 19 %
Lymphs Abs: 1.5 10*3/uL (ref 0.7–4.0)
Monocytes Absolute: 0.4 10*3/uL (ref 0.1–1.0)
Monocytes Relative: 5 %
Neutro Abs: 5.7 10*3/uL (ref 1.7–7.7)
Neutrophils Relative %: 74 %

## 2022-10-12 LAB — I-STAT CHEM 8, ED
BUN: 13 mg/dL (ref 8–23)
Calcium, Ion: 1.07 mmol/L — ABNORMAL LOW (ref 1.15–1.40)
Chloride: 103 mmol/L (ref 98–111)
Creatinine, Ser: 0.6 mg/dL (ref 0.44–1.00)
Glucose, Bld: 194 mg/dL — ABNORMAL HIGH (ref 70–99)
HCT: 37 % (ref 36.0–46.0)
Hemoglobin: 12.6 g/dL (ref 12.0–15.0)
Potassium: 4.2 mmol/L (ref 3.5–5.1)
Sodium: 138 mmol/L (ref 135–145)
TCO2: 27 mmol/L (ref 22–32)

## 2022-10-12 LAB — CBG MONITORING, ED
Glucose-Capillary: 197 mg/dL — ABNORMAL HIGH (ref 70–99)
Glucose-Capillary: 159 mg/dL — ABNORMAL HIGH (ref 70–99)

## 2022-10-12 LAB — PROTIME-INR
INR: 1.1 (ref 0.8–1.2)
Prothrombin Time: 13.9 seconds (ref 11.4–15.2)

## 2022-10-12 LAB — APTT: aPTT: 30 seconds (ref 24–36)

## 2022-10-12 LAB — ETHANOL: Alcohol, Ethyl (B): <10 mg/dL (ref <10)

## 2022-10-12 MED ORDER — INSULIN ASPART 100 UNIT/ML IJ SOLN
0.0000 [IU] | Freq: Three times a day (TID) | INTRAMUSCULAR | Status: DC
  Administered 2022-10-13: 2 [IU] via SUBCUTANEOUS
  Administered 2022-10-13 – 2022-10-14 (×3): 1 [IU] via SUBCUTANEOUS
  Administered 2022-10-15: 2 [IU] via SUBCUTANEOUS
  Administered 2022-10-15 – 2022-10-16 (×2): 3 [IU] via SUBCUTANEOUS
  Administered 2022-10-16: 2 [IU] via SUBCUTANEOUS
  Administered 2022-10-16: 3 [IU] via SUBCUTANEOUS
  Administered 2022-10-17 (×2): 2 [IU] via SUBCUTANEOUS
  Administered 2022-10-19: 7 [IU] via SUBCUTANEOUS
  Administered 2022-10-19 (×2): 3 [IU] via SUBCUTANEOUS

## 2022-10-12 MED ORDER — ACETAMINOPHEN 650 MG RE SUPP: 650.0000 mg | RECTAL | Status: DC | PRN

## 2022-10-12 MED ORDER — ACETAMINOPHEN 325 MG PO TABS
650.0000 mg | ORAL_TABLET | ORAL | Status: DC | PRN
  Administered 2022-10-17 – 2022-10-18 (×2): 650 mg via ORAL
  Filled 2022-10-12 (×4): qty 2

## 2022-10-12 MED ORDER — GABAPENTIN 300 MG PO CAPS
300.0000 mg | ORAL_CAPSULE | Freq: Three times a day (TID) | ORAL | Status: DC
  Administered 2022-10-12 – 2022-10-19 (×19): 300 mg via ORAL
  Filled 2022-10-12 (×19): qty 1

## 2022-10-12 MED ORDER — ACETAMINOPHEN 160 MG/5ML PO SOLN: 650.0000 mg | ORAL | Status: DC | PRN

## 2022-10-12 MED ORDER — HYDROXYZINE HCL 25 MG PO TABS
25.0000 mg | ORAL_TABLET | Freq: Every evening | ORAL | Status: DC | PRN
  Administered 2022-10-17 – 2022-10-19 (×2): 25 mg via ORAL
  Filled 2022-10-12 (×2): qty 1

## 2022-10-12 MED ORDER — NICOTINE 21 MG/24HR TD PT24
21.0000 mg | MEDICATED_PATCH | Freq: Every day | TRANSDERMAL | Status: DC
  Administered 2022-10-12 – 2022-10-19 (×8): 21 mg via TRANSDERMAL
  Filled 2022-10-12 (×7): qty 1

## 2022-10-12 MED ORDER — SODIUM CHLORIDE 0.9% FLUSH: 3.0000 mL | Freq: Once | INTRAVENOUS | Status: DC

## 2022-10-12 MED ORDER — IOHEXOL 350 MG/ML SOLN
75.0000 mL | Freq: Once | INTRAVENOUS | Status: AC | PRN
  Administered 2022-10-12: 75 mL via INTRAVENOUS

## 2022-10-12 MED ORDER — SENNOSIDES-DOCUSATE SODIUM 8.6-50 MG PO TABS: 1.0000 | ORAL_TABLET | Freq: Every evening | ORAL | Status: DC | PRN

## 2022-10-12 MED ORDER — LEVOTHYROXINE SODIUM 25 MCG PO TABS
137.0000 ug | ORAL_TABLET | Freq: Every day | ORAL | Status: DC
  Administered 2022-10-13: 137 ug via ORAL
  Filled 2022-10-12: qty 1

## 2022-10-12 MED ORDER — ENOXAPARIN SODIUM 40 MG/0.4ML IJ SOSY
40.0000 mg | PREFILLED_SYRINGE | INTRAMUSCULAR | Status: DC
  Administered 2022-10-12 – 2022-10-13 (×2): 40 mg via SUBCUTANEOUS
  Filled 2022-10-12 (×2): qty 0.4

## 2022-10-12 MED ORDER — ASPIRIN 325 MG PO TABS
325.0000 mg | ORAL_TABLET | Freq: Every day | ORAL | Status: DC
  Administered 2022-10-12 – 2022-10-14 (×3): 325 mg via ORAL
  Filled 2022-10-12 (×3): qty 1

## 2022-10-12 MED ORDER — INSULIN ASPART 100 UNIT/ML IJ SOLN
0.0000 [IU] | Freq: Every day | INTRAMUSCULAR | Status: DC
  Administered 2022-10-15 – 2022-10-18 (×2): 2 [IU] via SUBCUTANEOUS

## 2022-10-12 MED ORDER — ROSUVASTATIN CALCIUM 20 MG PO TABS
20.0000 mg | ORAL_TABLET | Freq: Every day | ORAL | Status: DC
  Administered 2022-10-13: 20 mg via ORAL
  Filled 2022-10-12: qty 1

## 2022-10-12 MED ORDER — STROKE: EARLY STAGES OF RECOVERY BOOK
Freq: Once | Status: AC
  Filled 2022-10-12: qty 1

## 2022-10-12 MED ORDER — HYDRALAZINE HCL 20 MG/ML IJ SOLN: 10.0000 mg | Freq: Four times a day (QID) | INTRAMUSCULAR | Status: DC | PRN
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
## 2022-10-12 NOTE — Assessment & Plan Note (Signed)
Continue rosuvastatin.  
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
## 2022-10-12 NOTE — ED Notes (Signed)
Patient transported to MRI 
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
## 2022-10-12 NOTE — ED Triage Notes (Addendum)
PER EMS: pt arrives from home as a Code Stroke, LSN at 1530 today. She reports a chronic history of numbness to the pinky and ring ringers of her left hand, since early 2000s. Over the last two weeks she states her left hand has become numb and then today around 1530 she reports feeling numb and weak to her entire left arm. En route to the ER she started feeling numbness to her entire left leg. + decreased sensation to left side of face, left arm and left leg. Pt arrives A&OX4, and ems reported she was ambulatory on scene.   BP-191/111, HR-91, CBG-242

## 2022-10-12 NOTE — Hospital Course (Signed)
Pamela Cherry is a 71 y.o. female with medical history significant for T2DM, HTN, HLD, hypothyroidism, s/p right BKA, tobacco use, chronic numbness to left fourth and fifth fingers who presented with left-sided numbness and admitted for CVA work-up.

## 2022-10-12 NOTE — ED Notes (Signed)
Patient transported to CT 

## 2022-10-12 NOTE — Assessment & Plan Note (Signed)
Continue Synthroid °
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
## 2022-10-12 NOTE — Assessment & Plan Note (Signed)
Smoking 1 pack/day since age 71.  Smoking cessation advised.  Nicotine patch ordered.

## 2022-10-12 NOTE — Assessment & Plan Note (Signed)
Hold home olmesartan to allow permissive hypertension.

## 2022-10-12 NOTE — Assessment & Plan Note (Signed)
Hold home metformin and glipizide.  Placed on SSI.

## 2022-10-12 NOTE — Assessment & Plan Note (Addendum)
Presenting with new onset left facial, arm, and leg numbness.  Admitted for CVA work-up. -Neurology following -CT head negative for acute infarct -CTA head/neck -MRI brain without contrast -TTE -Keep on telemetry, continue neurochecks -On aspirin 325 mg at home; adjust antiplatelet regimen pending neurology recommendations -PT/OT/SLP eval -Check A1c, lipid panel -Allow permissive hypertension for now

## 2022-10-12 NOTE — ED Notes (Signed)
Pt has returned from MRI. 

## 2022-10-12 NOTE — Telephone Encounter (Signed)
  Chief Complaint: Numbness Symptoms: Numbness left hand, Traveling up my arm."  Frequency: Sunday hand, worsening today, now up through arm, onset just prior to call. Pertinent Negatives: Patient denies  Disposition: '[x]'$ ED /'[]'$ Urgent Care (no appt availability in office) / '[]'$ Appointment(In office/virtual)/ '[]'$  [facility]/ '[]'$ Home Care/ '[]'$ Refused Recommended Disposition /'[]'$ [facility]/ '[]'$  Follow-up with PCP Additional Notes: Advised ED, pt calling 911 for EMS. States she will do so.  Called EMS to ensure pt did call. States they are at residence now. Reason for Disposition  [1] Numbness (i.e., loss of sensation) of the face, arm / hand, or leg / foot on one side of the body AND [2] sudden onset AND [3] present now  Additional Information  Negative: [1] Numbness (i.e., loss of sensation) of the face, arm / hand, or leg / foot on one side of the body AND [2] sudden onset AND [3] brief (now gone)    Numbness remains  Answer Assessment - Initial Assessment Questions 1. SYMPTOM: "What is the main symptom you are concerned about?" (e.g., weakness, numbness)     Numbness left hand and arm 2. ONSET: "When did this start?" (minutes, hours, days; while sleeping)     Monday 3. LAST NORMAL: "When was the last time you (the patient) were normal (no symptoms)?"     Sunday 4. PATTERN "Does this come and go, or has it been constant since it started?"  "Is it present now?"     Constant 5. CARDIAC SYMPTOMS: "Have you had any of the following symptoms: chest pain, difficulty breathing, palpitations?"     No 6. NEUROLOGIC SYMPTOMS: "Have you had any of the following symptoms: headache, dizziness, vision loss, double vision, changes in speech, unsteady on your feet?"     No 7. OTHER SYMPTOMS: "Do you have any other symptoms?"     Fatigued  Protocols used: Neurologic Deficit-A-AH

## 2022-10-13 ENCOUNTER — Observation Stay (HOSPITAL_COMMUNITY): Payer: Medicare HMO

## 2022-10-13 ENCOUNTER — Observation Stay (HOSPITAL_BASED_OUTPATIENT_CLINIC_OR_DEPARTMENT_OTHER): Payer: Medicare HMO

## 2022-10-13 ENCOUNTER — Encounter (HOSPITAL_COMMUNITY): Payer: Self-pay | Admitting: Internal Medicine

## 2022-10-13 DIAGNOSIS — Z720 Tobacco use: Secondary | ICD-10-CM | POA: Diagnosis not present

## 2022-10-13 DIAGNOSIS — R200 Anesthesia of skin: Secondary | ICD-10-CM | POA: Diagnosis not present

## 2022-10-13 DIAGNOSIS — M19012 Primary osteoarthritis, left shoulder: Secondary | ICD-10-CM | POA: Diagnosis not present

## 2022-10-13 DIAGNOSIS — E119 Type 2 diabetes mellitus without complications: Secondary | ICD-10-CM

## 2022-10-13 DIAGNOSIS — E785 Hyperlipidemia, unspecified: Secondary | ICD-10-CM | POA: Diagnosis not present

## 2022-10-13 DIAGNOSIS — I10 Essential (primary) hypertension: Secondary | ICD-10-CM | POA: Diagnosis not present

## 2022-10-13 DIAGNOSIS — S46012A Strain of muscle(s) and tendon(s) of the rotator cuff of left shoulder, initial encounter: Secondary | ICD-10-CM | POA: Diagnosis not present

## 2022-10-13 DIAGNOSIS — E1169 Type 2 diabetes mellitus with other specified complication: Secondary | ICD-10-CM | POA: Diagnosis not present

## 2022-10-13 LAB — BASIC METABOLIC PANEL
Anion gap: 11 (ref 5–15)
BUN: 12 mg/dL (ref 8–23)
CO2: 23 mmol/L (ref 22–32)
Calcium: 8.9 mg/dL (ref 8.9–10.3)
Chloride: 105 mmol/L (ref 98–111)
Creatinine, Ser: 0.68 mg/dL (ref 0.44–1.00)
GFR, Estimated: >60 mL/min (ref >60)
Glucose, Bld: 131 mg/dL — ABNORMAL HIGH (ref 70–99)
Potassium: 3.6 mmol/L (ref 3.5–5.1)
Sodium: 139 mmol/L (ref 135–145)

## 2022-10-13 LAB — ECHOCARDIOGRAM COMPLETE
Area-P 1/2: 2.13 cm2
Height: 64 in
MV VTI: 2.45 cm2
S' Lateral: 2.7 cm
Weight: 3329.83 oz

## 2022-10-13 LAB — GLUCOSE, CAPILLARY
Glucose-Capillary: 130 mg/dL — ABNORMAL HIGH (ref 70–99)
Glucose-Capillary: 163 mg/dL — ABNORMAL HIGH (ref 70–99)
Glucose-Capillary: 142 mg/dL — ABNORMAL HIGH (ref 70–99)

## 2022-10-13 LAB — HEMOGLOBIN A1C
Hgb A1c MFr Bld: 6.3 % — ABNORMAL HIGH (ref 4.8–5.6)
Mean Plasma Glucose: 134.11 mg/dL

## 2022-10-13 LAB — CBC
HCT: 32.8 % — ABNORMAL LOW (ref 36.0–46.0)
Hemoglobin: 10 g/dL — ABNORMAL LOW (ref 12.0–15.0)
MCH: 24.2 pg — ABNORMAL LOW (ref 26.0–34.0)
MCHC: 30.5 g/dL (ref 30.0–36.0)
MCV: 79.2 fL — ABNORMAL LOW (ref 80.0–100.0)
Platelets: 228 10*3/uL (ref 150–400)
RBC: 4.14 MIL/uL (ref 3.87–5.11)
RDW: 18.5 % — ABNORMAL HIGH (ref 11.5–15.5)
WBC: 5.6 10*3/uL (ref 4.0–10.5)
nRBC: 0 % (ref 0.0–0.2)

## 2022-10-13 LAB — LIPID PANEL
Cholesterol: 167 mg/dL (ref 0–200)
HDL: 27 mg/dL — ABNORMAL LOW (ref >40)
Total CHOL/HDL Ratio: 6.2 RATIO
Triglycerides: 194 mg/dL — ABNORMAL HIGH (ref <150)
VLDL: 39 mg/dL (ref 0–40)

## 2022-10-13 LAB — LDL CHOLESTEROL, DIRECT: Direct LDL: 104 mg/dL — ABNORMAL HIGH (ref 0–99)

## 2022-10-13 LAB — VITAMIN B12: Vitamin B-12: 183 pg/mL (ref 180–914)

## 2022-10-13 LAB — CBG MONITORING, ED: Glucose-Capillary: 123 mg/dL — ABNORMAL HIGH (ref 70–99)

## 2022-10-13 LAB — TSH: TSH: 10.183 u[IU]/mL — ABNORMAL HIGH (ref 0.350–4.500)

## 2022-10-13 MED ORDER — TRAMADOL HCL 50 MG PO TABS
50.0000 mg | ORAL_TABLET | Freq: Four times a day (QID) | ORAL | Status: DC | PRN
  Administered 2022-10-15: 50 mg via ORAL
  Filled 2022-10-13: qty 1

## 2022-10-13 MED ORDER — IRBESARTAN 300 MG PO TABS
300.0000 mg | ORAL_TABLET | Freq: Every day | ORAL | Status: DC
  Administered 2022-10-13 – 2022-10-19 (×7): 300 mg via ORAL
  Filled 2022-10-13 (×7): qty 1

## 2022-10-13 MED ORDER — LEVOTHYROXINE SODIUM 75 MCG PO TABS
150.0000 ug | ORAL_TABLET | Freq: Every day | ORAL | Status: DC
  Administered 2022-10-14 – 2022-10-19 (×6): 150 ug via ORAL
  Filled 2022-10-13 (×6): qty 2

## 2022-10-13 MED ORDER — LIDOCAINE 5 % EX PTCH
1.0000 | MEDICATED_PATCH | CUTANEOUS | Status: DC
  Administered 2022-10-13 – 2022-10-19 (×7): 1 via TRANSDERMAL
  Filled 2022-10-13 (×7): qty 1

## 2022-10-13 MED ORDER — ROSUVASTATIN CALCIUM 20 MG PO TABS
40.0000 mg | ORAL_TABLET | Freq: Every day | ORAL | Status: DC
  Administered 2022-10-14 – 2022-10-19 (×6): 40 mg via ORAL
  Filled 2022-10-13 (×6): qty 2

## 2022-10-13 MED ORDER — PERFLUTREN LIPID MICROSPHERE
1.0000 mL | INTRAVENOUS | Status: AC | PRN
  Administered 2022-10-13: 2 mL via INTRAVENOUS

## 2022-10-13 MED ORDER — VITAMIN B-12 1000 MCG PO TABS
1000.0000 ug | ORAL_TABLET | Freq: Every day | ORAL | Status: DC
  Administered 2022-10-13 – 2022-10-19 (×7): 1000 ug via ORAL
  Filled 2022-10-13 (×7): qty 1

## 2022-10-13 NOTE — Progress Notes (Signed)
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  Over the last 1 or 2 weeks he has noted intermittent numbness involving the whole left hand.  The day of admission around 1530 she developed new numbness involving her left arm.  Subsequently developed left leg and left face numbness.  No associated focal weakness, change in speech or change in vision. CT head without contrast was negative for acute infarct.  Mild white matter hypodensity in the frontal lobe bilaterally seen most likely due to chronic microvascular ischemia.  Patient has been admitted for further CVA work-up.  Assessment & Plan:   Principal Problem:   Left sided numbness Active Problems:   Hypertension associated with diabetes ([place])   Type 2 diabetes mellitus without complication (HCC)   Acquired hypothyroidism   Hyperlipidemia associated with type 2 diabetes mellitus (HCC)   Tobacco use  1-Left side  numbness: left arm and leg.  -MRI: No acute intracranial abnormality. Patchy T2/FLAIR hyperintensity involving the supratentorial cerebral white matter, nonspecific, but most commonly related to chronic microvascular ischemic disease, mild in nature. Empty sella. -B 12: 183. Start supplement.  -CTA: Negative for CTA large vessel occlusion.  Moderate atheromatous changes in the right carotid bulb/proximal right ICA with associated stenosis up to 65%.--Neurology recommendation  2-Hypertension associated with diabetes Resume Olmersartan.   3-Diabetes type 2: SSI.  Hold metformin and glipizide while inpatient  4-Tobacco  use: Nicotine  patch Counseling.   5-Hyperlipidemia Continue with Crestor  6-Acquired hypothyroidism TSH at  10, will increase Synthroid to 150 mcg  Right shoulder pain; check X ray.     Estimated body mass index is 35.72 kg/m as calculated from the following:   Height as of this encounter: '5\' 4"'$  (1.626 m).   Weight as of this encounter: 94.4 kg.   DVT prophylaxis: Lovenox Code Status: Full code Family Communication: care discussed with patient.  Disposition Plan:  Status is: Observation The patient remains OBS appropriate and will d/c before 2 midnights.    Consultants:  Neurology   Procedures:  ECHO   Antimicrobials:    Subjective: She report shoulder pain, unable to move arm due to pain.  She report Left arm and leg numbness tingling. Denies neck pain.   Objective: Vitals:   10/13/22 0341 10/13/22 0536 10/13/22 0747 10/13/22 0750  BP: (!) 145/62 130/70 (!) 147/51   Pulse: 74 72 69   Resp: '18 18 18   '$ Temp: 98.6 F (37 C)   97.8 F (36.6 C)  TempSrc: Oral   Oral  SpO2: 98% 99% 95%   Weight:      Height:       No intake or output data in the 24 hours ending 10/13/22 0806 Filed Weights   10/12/22 1700 10/12/22 1757  Weight: 94.4 kg 94.4 kg    Examination:  General exam: Appears calm and comfortable  Respiratory system: Clear to auscultation. Respiratory effort normal. Cardiovascular system: S1 & S2 heard, RRR. No JVD, murmurs, rubs, gallops or clicks. No pedal edema. Gastrointestinal system: Abdomen is nondistended, soft and nontender. No organomegaly or masses felt. Normal bowel sounds heard. Central nervous system: Alert and  oriented. Left upper arm  with limit mobility due to pain. Moves all other extremities.  Extremities: Symmetric 5 x 5 power.   Data Reviewed: I have personally reviewed following labs and imaging studies  CBC: Recent Labs  Lab 10/12/22 1727 10/12/22 1732 10/13/22 0307  WBC 7.8  --  5.6  NEUTROABS 5.7  --   --   HGB 11.3* 12.6  10.0*  HCT 36.8 37.0 32.8*  MCV 78.5*  --  79.2*  PLT 236  --  300   Basic Metabolic Panel: Recent Labs  Lab 10/12/22 1727 10/12/22 1732 10/13/22 0307  NA 141 138 139  K 4.3 4.2 3.6  CL 104 103 105  CO2 24  --  23  GLUCOSE 198* 194* 131*  BUN '12 13 12  '$ CREATININE 0.78 0.60 0.68  CALCIUM 9.4  --  8.9   GFR: Estimated Creatinine Clearance: 70.8 mL/min (by C-G formula based on SCr of 0.68 mg/dL). Liver Function Tests: Recent Labs  Lab 10/12/22 1727  AST 30  ALT 13  ALKPHOS 84  BILITOT 0.7  PROT 6.6  ALBUMIN 3.5   No results for input(s): "LIPASE", "AMYLASE" in the last 168 hours. No results for input(s): "AMMONIA" in the last 168 hours. Coagulation Profile: Recent Labs  Lab 10/12/22 1727  INR 1.1   Cardiac Enzymes: No results for input(s): "CKTOTAL", "CKMB", "CKMBINDEX", "TROPONINI" in the last 168 hours. BNP (last 3 results) No results for input(s): "PROBNP" in the last 8760 hours. HbA1C: Recent Labs    10/13/22 0307  HGBA1C 6.3*   CBG: Recent Labs  Lab 10/12/22 1727 10/12/22 2146  GLUCAP 197* 159*   Lipid Profile: Recent Labs    10/13/22 0307  CHOL 167  HDL 27*  LDLCALC NOT CALCULATED  TRIG 194*  CHOLHDL 6.2   Thyroid Function Tests: No results for input(s): "TSH", "T4TOTAL", "FREET4", "T3FREE", "THYROIDAB" in the last 72 hours. Anemia Panel: No results for input(s): "VITAMINB12", "FOLATE", "FERRITIN", "TIBC", "IRON", "RETICCTPCT" in the last 72 hours. Sepsis Labs: No results for input(s): "PROCALCITON", "LATICACIDVEN" in the last 168 hours.  No results found for this or any previous visit (from the past 240 hour(s)).       Radiology Studies: MR BRAIN WO CONTRAST  Result Date: 10/12/2022 CLINICAL DATA:  Initial evaluation for neuro deficit, stroke suspected. EXAM: MRI HEAD WITHOUT CONTRAST TECHNIQUE: Multiplanar, multiecho pulse sequences of the brain and surrounding structures were obtained without intravenous contrast. COMPARISON:   CTs from earlier the same day. FINDINGS: Brain: Cerebral volume within normal limits. Patchy T2/FLAIR hyperintensity involving the supratentorial cerebral white matter, nonspecific, but most commonly related to chronic microvascular ischemic disease, mild in nature. No evidence for acute or subacute ischemia. Gray-white matter differentiation maintained. No areas of chronic cortical infarction. No acute or chronic intracranial blood products. No mass lesion, midline shift or mass effect. No hydrocephalus or extra-axial fluid collection. Empty sella noted. Vascular: Major intracranial vascular flow voids are maintained. Skull and upper cervical spine: Craniocervical junction normal. Bone marrow signal intensity normal. No scalp soft tissue abnormality. Sinuses/Orbits: Globes and orbital soft tissues within normal limits. Paranasal sinuses are largely clear. No significant mastoid effusion. Other: None. IMPRESSION: 1. No acute intracranial abnormality. 2. Patchy T2/FLAIR hyperintensity involving the supratentorial cerebral white matter, nonspecific, but most commonly related to chronic microvascular ischemic disease, mild in nature. 3. Empty sella. Electronically Signed   By: Jeannine Boga M.D.   On: 10/12/2022 23:05   CT ANGIO HEAD NECK W WO CM  Result Date: 10/12/2022 CLINICAL DATA:  Initial evaluation for neuro deficit, stroke suspected. EXAM: CT ANGIOGRAPHY HEAD AND NECK TECHNIQUE: Multidetector CT imaging of the head and neck was performed using the standard protocol during bolus administration of intravenous contrast. Multiplanar CT image reconstructions and MIPs were obtained to evaluate the vascular anatomy. Carotid stenosis measurements (when applicable) are obtained utilizing NASCET criteria, using the distal internal carotid diameter as the denominator. RADIATION DOSE REDUCTION: This exam was performed according to the departmental dose-optimization program which includes automated exposure  control, adjustment of the mA and/or kV according to patient size and/or use of iterative reconstruction technique. CONTRAST:  53m OMNIPAQUE IOHEXOL 350 MG/ML SOLN COMPARISON:  Prior CT from earlier the same day. FINDINGS: CT HEAD FINDINGS Brain: Cerebral volume within normal limits. 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 No acute intracranial hemorrhage. No acute large vessel territory infarct. No mass lesion or midline shift. No hydrocephalus or extra-axial fluid collection. Empty sella noted. Vascular: No abnormal hyperdense vessel. Calcified atherosclerosis present at skull base. Skull: Scalp soft tissues and calvarium demonstrate no acute finding. Sinuses/Orbits: Globes orbital soft tissues within normal limits. Few subcentimeter osteoma is noted at the left ethmoidal air cells. Paranasal sinuses are largely clear. No significant mastoid effusion. Other: None. Review of the MIP images confirms the above findings CTA NECK FINDINGS Aortic arch: Visualized aortic arch normal caliber with standard 3 vessel morphology. Moderate atheromatous change about the arch and origin of the great vessels without hemodynamically significant stenosis. Right carotid system: Right common and internal carotid arteries patent without dissection or occlusion. Moderate atheromatous change about the right carotid bulb/proximal right ICA with associated stenosis of up to 65% by NASCET criteria. Left carotid system: Left common and internal carotid arteries patent without dissection or occlusion. Moderate atheromatous change about the left carotid bulb/proximal cervical left ICA but without hemodynamically significant greater than 50% stenosis. Vertebral arteries: Both vertebral arteries arise from the subclavian arteries. No proximal subclavian artery stenosis. No dissection or occlusion. Scattered atheromatous change within both vertebral arteries with focal moderate left V3  stenosis (series 11, image 176). No other hemodynamically significant stenosis. Skeleton: No discrete or worrisome osseous lesions. Mild-to-moderate spondylosis present at C4-5 through C6-7. Patient is edentulous. Other neck: No other acute soft tissue abnormality within the neck. Heterogeneous multinodular right lobe of thyroid, indeterminate. Upper chest: Visualized upper chest demonstrates no acute finding. Review of the MIP images confirms the above findings CTA HEAD FINDINGS Anterior circulation: Atheromatous change seen within the carotid siphons with associated mild to moderate multifocal narrowing. A1 segments patent bilaterally. Normal anterior communicating artery complex. Anterior cerebral arteries patent without stenosis. No M1 stenosis or occlusion. No proximal MCA branch occlusion or high-grade stenosis. Distal MCA branches perfused and symmetric. Posterior circulation: Scattered atheromatous change within the V4 segments bilaterally without significant stenosis. Both PICA patent. Basilar widely patent to its distal aspect without stenosis. Superior cerebellar and posterior cerebral arteries patent bilaterally. Venous sinuses: Patent allowing for timing the contrast bolus. Anatomic variants: None significant.  No aneurysm. Review of the MIP images confirms the above findings IMPRESSION: CT HEAD IMPRESSION: 1. No acute intracranial abnormality. 2. 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 3. Empty sella. CTA HEAD AND NECK: 1. Negative CTA for large vessel occlusion or other emergent finding. 2. Moderate atheromatous change about the right carotid bulb/proximal right ICA with associated stenosis of up to 65% by NASCET criteria. 3. Atheromatous change within  the carotid siphons with associated mild to moderate multifocal narrowing. 4. Focal moderate left V3 stenosis. 5. 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 Further  evaluation with dedicated thyroid ultrasound recommended. This could be performed on a nonemergent outpatient basis. (Ref: J Am Coll Radiol. 2015 Feb;12(2): 143-50). Aortic Atherosclerosis (ICD10-I70.0). Electronically Signed   By: Jeannine Boga M.D.   On: 10/12/2022 21:53   CT HEAD CODE STROKE WO CONTRAST  Result Date: 10/12/2022 CLINICAL DATA:  Code stroke. Acute neuro deficit. Left-sided weakness EXAM: CT HEAD WITHOUT CONTRAST TECHNIQUE: Contiguous axial images were obtained from the base of the skull through the vertex without intravenous contrast. RADIATION DOSE REDUCTION: This exam was performed according to the departmental dose-optimization program which includes automated exposure control, adjustment of the mA and/or kV according to patient size and/or use of iterative reconstruction technique. COMPARISON:  None Available. FINDINGS: Brain: No evidence of acute infarction, hemorrhage, hydrocephalus, extra-axial collection or mass lesion/mass effect. Mild white matter hypodensity in the frontal lobes bilaterally. Enlarged sella filled with CSF. Small pituitary. Findings compatible with empty sella. Vascular: Negative for hyperdense vessel Skull: Negative Sinuses/Orbits: Mild mucosal edema maxillary sinus bilaterally. Small osteoma left ethmoid sinus. Negative orbit Other: None ASPECTS ([place] Stroke Program Early CT Score) - Ganglionic level infarction (caudate, lentiform nuclei, internal capsule, insula, M1-M3 cortex): 7 - Supraganglionic infarction (M4-M6 cortex): 3 Total score (0-10 with 10 being normal): 10 IMPRESSION: 1. Negative for acute infarct. Mild white matter hypodensity in the frontal lobes bilaterally most likely due to chronic microvascular ischemia. 2. Aspects is 10. 3. Code stroke imaging results were communicated on 10/12/2022 at 5:42 pm to provider [place] via secure text page Electronically Signed   By: Franchot Gallo M.D.   On: 10/12/2022 17:42        Scheduled Meds:    stroke: early stages of recovery book   Does not apply Once   aspirin  325 mg Oral Daily   enoxaparin (LOVENOX) injection  40 mg Subcutaneous Q24H   gabapentin  300 mg Oral TID   insulin aspart  0-5 Units Subcutaneous QHS   insulin aspart  0-9 Units Subcutaneous TID WC   levothyroxine  137 mcg Oral QAC breakfast   lidocaine  1 patch Transdermal Q24H   nicotine  21 mg Transdermal Daily   rosuvastatin  20 mg Oral Daily   sodium chloride flush  3 mL Intravenous Once   Continuous Infusions:   LOS: 0 days    Time spent: 35 minutes.     Elmarie Shiley, MD [facility]   If 7PM-7AM, please contact night-coverage [email or web address]  10/13/2022, 8:06 AM

## 2022-10-13 NOTE — Evaluation (Signed)
Occupational Therapy Evaluation Patient Details Name: Pamela Cherry MRN: [id number] DOB: 1951/06/24 Today's Date: 10/13/2022   History of Present Illness Pt is a 71 y/o female who presented with L sided numbness and weakness. MRI brain negative for acute abnormalities. 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   Clinical Impression   PTA, pt lives with husband, typically Modified Independent with ADLs, community IADLs, driving and mobility using a Rollator. Pt presents now with deficits in L sided strength and sensation, as well as L shoulder pain though moving fairly well. Pt requires no more than min guard for LB ADLs, including R LE prosthetic mgmt. Pt able to stand with RW at min guard though d/t L LE numbness, felt fearful of taking steps at this time. Discussed alternative transfer methods, use of w/c at home if L sided numbness persists to decrease fall risk. Anticipate pt to progress well acutely and return home with [facility] services.       Recommendations for follow up therapy are one component of a multi-disciplinary discharge planning process, led by the attending physician.  Recommendations may be updated based on patient status, additional functional criteria and insurance authorization.   Follow Up Recommendations  Home health OT    Assistance Recommended at Discharge Set up Supervision/Assistance  Patient can return home with the following Assistance with cooking/housework;Assist for transportation    Functional Status Assessment  Patient has had a recent decline in their functional status and demonstrates the ability to make significant improvements in function in a reasonable and predictable amount of time.  Equipment Recommendations  None recommended by OT    Recommendations for Other Services       Precautions / Restrictions Precautions Precautions: Fall;Other (comment) Precaution Comments: R LE prosthetic  (present in room) Restrictions Weight Bearing Restrictions: No      Mobility Bed Mobility Overal bed mobility: Needs Assistance Bed Mobility: Supine to Sit, Sit to Supine     Supine to sit: Min assist Sit to supine: Min guard   General bed mobility comments: light handheld assist to lift trunk    Transfers Overall transfer level: Needs assistance   Transfers: Sit to/from Stand Sit to Stand: Min guard           General transfer comment: able to easily stand with RW at bedside, did not feel comfortable taking steps due to L LE numbness      Balance Overall balance assessment: Needs assistance Sitting-balance support: No upper extremity supported, Feet supported Sitting balance-Leahy Scale: Good     Standing balance support: Bilateral upper extremity supported, During functional activity Standing balance-Leahy Scale: Poor                             ADL either performed or assessed with clinical judgement   ADL Overall ADL's : Needs assistance/impaired Eating/Feeding: Independent   Grooming: Set up;Sitting   Upper Body Bathing: Set up;Sitting   Lower Body Bathing: Min guard;Sitting/lateral leans;Sit to/from stand   Upper Body Dressing : Set up   Lower Body Dressing: Min guard;Sitting/lateral leans;Sit to/from stand Lower Body Dressing Details (indicate cue type and reason): able to easily don prosthetic sitting EOB     Toileting- Clothing Manipulation and Hygiene: Min guard;Sitting/lateral lean;Sit to/from stand         General ADL Comments: able to stand at bedsid easily with RW though did not feel comfortable taking steps  at this time due to numbness and fear of falling. discussed transfer options if difficulty taking steps, use of w/c at home to decrease fall risk if numbness persists     Vision Baseline Vision/History: 1 Wears glasses Ability to See in Adequate Light: 1 Impaired Patient Visual Report: No change from baseline Vision  Assessment?: No apparent visual deficits     Perception     Praxis      Pertinent Vitals/Pain Pain Assessment Pain Assessment: Faces Faces Pain Scale: Hurts a little bit Pain Location: L shoulder Pain Descriptors / Indicators: Sore Pain Intervention(s): Monitored during session, Limited activity within patient's tolerance     Hand Dominance Right   Extremity/Trunk Assessment Upper Extremity Assessment Upper Extremity Assessment: LUE deficits/detail LUE Deficits / Details: slight weakness in comparison to R UE. numbness/tingling throughout, digit opposition slow and purposeful (able to complete with eyes closed also) LUE Sensation: decreased light touch   Lower Extremity Assessment Lower Extremity Assessment: Defer to PT evaluation   Cervical / Trunk Assessment Cervical / Trunk Assessment: Normal   Communication Communication Communication: No difficulties   Cognition Arousal/Alertness: Awake/alert Behavior During Therapy: WFL for tasks assessed/performed Overall Cognitive Status: Within Functional Limits for tasks assessed                                       General Comments       Exercises     Shoulder Instructions      Home Living Family/patient expects to be discharged to:: Private residence Living Arrangements: Spouse/significant other Available Help at Discharge: Family Type of Home: House Home Access: Ramped entrance     Home Layout: One level     Bathroom Shower/Tub: Teacher, early years/pre: Standard     Home Equipment: Rollator (4 wheels);Wheelchair - Biomedical scientist Comments: Husband independent from a w/c level      Prior Functioning/Environment Prior Level of Function : Independent/Modified Independent;Driving             Mobility Comments: use of Rollator for mobility ADLs Comments: Drives, grocery shops, completes IADLs without issue        OT Problem List: Decreased  strength;Impaired sensation;Decreased coordination      OT Treatment/Interventions: Self-care/ADL training;Therapeutic exercise;Energy conservation;DME and/or AE instruction;Therapeutic activities;Patient/family education    OT Goals(Current goals can be found in the care plan section) Acute Rehab OT Goals Patient Stated Goal: home, return to independence, for numbness to resolve OT Goal Formulation: With patient Time For Goal Achievement: 10/27/22 Potential to Achieve Goals: Good  OT Frequency: Min 2X/week    Co-evaluation              AM-PAC OT "6 Clicks" Daily Activity     Outcome Measure Help from another person eating meals?: None Help from another person taking care of personal grooming?: A Little Help from another person toileting, which includes using toliet, bedpan, or urinal?: A Little Help from another person bathing (including washing, rinsing, drying)?: A Little Help from another person to put on and taking off regular upper body clothing?: A Little Help from another person to put on and taking off regular lower body clothing?: A Little 6 Click Score: 19   End of Session Equipment Utilized During Treatment: Rolling walker (2 wheels)  Activity Tolerance: Patient tolerated treatment well Patient left: in bed;with call bell/phone within reach;Other (comment) (with PT entering for  session)  OT Visit Diagnosis: Muscle weakness (generalized) (M62.81)                Time: 2446-9507 OT Time Calculation (min): 20 min Charges:  OT General Charges $OT Visit: 1 Visit OT Evaluation $OT Eval Low Complexity: 1 Low  Malachy Chamber, OTR/L Acute Rehab Services Office: [phone]   Layla Maw 10/13/2022, 12:34 PM

## 2022-10-13 NOTE — Evaluation (Signed)
Physical Therapy Evaluation Patient Details Name: Pamela Cherry MRN: [id number] DOB: [birth year] Today's Date: 10/13/2022  History of Present Illness  Pt is a 71 y/o female who presented with L sided numbness and weakness. 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 MRI brain negative for acute abnormalities. 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  Clinical Impression  Pt admitted with above diagnosis. At baseline, pt is ambulatory with rollator and does have a w/c at home if needed.  Today, pt presenting with L sided decreased sensation and strength.  She required min guard for transfers and was able to pivot toward both sides without physical assist.  She did not want to take further steps due to decreased sensation L LE and fearful of falling.  Pt states prefers to go home even if just pivoting to w/c and have HHPT.  Reports w/c will fit throughout house.   Pt currently with functional limitations due to the deficits listed below (see PT Problem List). Pt will benefit from skilled PT to increase their independence and safety with mobility to allow discharge to the venue listed below.          Recommendations for follow up therapy are one component of a multi-disciplinary discharge planning process, led by the attending physician.  Recommendations may be updated based on patient status, additional functional criteria and insurance authorization.  Follow Up Recommendations Home health PT      Assistance Recommended at Discharge Intermittent Supervision/Assistance  Patient can return home with the following  Assistance with cooking/housework;A little help with bathing/dressing/bathroom;A little help with walking and/or transfers;Help with stairs or ramp for entrance    Equipment Recommendations None recommended by PT  Recommendations for Other Services       Functional Status  Assessment Patient has had a recent decline in their functional status and demonstrates the ability to make significant improvements in function in a reasonable and predictable amount of time.     Precautions / Restrictions Precautions Precautions: Fall;Other (comment) Precaution Comments: R LE prosthetic (present in room) Restrictions Weight Bearing Restrictions: No      Mobility  Bed Mobility Overal bed mobility: Needs Assistance Bed Mobility: Supine to Sit, Sit to Supine     Supine to sit: Min assist Sit to supine: Supervision   General bed mobility comments: light handheld assist to lift trunk    Transfers Overall transfer level: Needs assistance Equipment used: None Transfers: Sit to/from Stand, Bed to chair/wheelchair/BSC Sit to Stand: Min guard   Step pivot transfers: Min guard       General transfer comment: Pt standing and tep pivoted toward R side to sit in chair; then stood and step pivoted L side to bed    Ambulation/Gait               General Gait Details: Not comfortable taking further steps due to L LE numbness  Stairs            Wheelchair Mobility    Modified Rankin (Stroke Patients Only) Modified Rankin (Stroke Patients Only) Pre-Morbid Rankin Score: No significant disability Modified Rankin: Moderately severe disability     Balance Overall balance assessment: Needs assistance Sitting-balance support: No upper extremity supported, Feet supported Sitting balance-Leahy Scale: Good     Standing balance support: Single extremity supported, Reliant on assistive device for balance Standing balance-Leahy Scale: Poor Standing balance comment: Steady static stance but requiring UE support  Pertinent Vitals/Pain Pain Assessment Pain Assessment: Faces Faces Pain Scale: Hurts a little bit Pain Location: L shoulder Pain Descriptors / Indicators: Sore Pain Intervention(s): Limited activity within  patient's tolerance, Monitored during session    Home Living Family/patient expects to be discharged to:: Private residence Living Arrangements: Spouse/significant other Available Help at Discharge: Family Type of Home: House Home Access: Ramped entrance       Home Layout: One level Home Equipment: Rollator (4 wheels);Wheelchair - Sport and exercise psychologist Comments: Husband independent from a w/c level    Prior Function Prior Level of Function : Independent/Modified Independent;Driving             Mobility Comments: use of Rollator for mobility ADLs Comments: Drives, grocery shops, completes IADLs without issue     Hand Dominance   Dominant Hand: Right    Extremity/Trunk Assessment   Upper Extremity Assessment Upper Extremity Assessment: Defer to OT evaluation LUE Deficits / Details: slight weakness in comparison to R UE. numbness/tingling throughout, digit opposition slow and purposeful (able to complete with eyes closed also) LUE Sensation: decreased light touch    Lower Extremity Assessment Lower Extremity Assessment: LLE deficits/detail;RLE deficits/detail RLE Deficits / Details: Hx R BKA; ROM WFL; MMT 5/5 LLE Deficits / Details: ROM WFL; MMT: ankle 4/5, knee ext 4/5, hip flex 4/5 LLE Sensation: decreased light touch (decreased throughout LE (not completely numb))    Cervical / Trunk Assessment Cervical / Trunk Assessment: Normal  Communication   Communication: No difficulties  Cognition Arousal/Alertness: Awake/alert Behavior During Therapy: WFL for tasks assessed/performed Overall Cognitive Status: Within Functional Limits for tasks assessed                                          General Comments General comments (skin integrity, edema, etc.): Pt prefers not go to rehab and reports comfortable going home at w/c stand pivot level.  States w/c will fit throughout house including bathroom    Exercises     Assessment/Plan    PT  Assessment Patient needs continued PT services  PT Problem List Decreased strength;Decreased mobility;Decreased safety awareness;Decreased activity tolerance;Decreased balance;Impaired sensation;Decreased knowledge of use of DME;Pain       PT Treatment Interventions DME instruction;Therapeutic activities;Gait training;Therapeutic exercise;Patient/family education;Stair training;Balance training;Functional mobility training    PT Goals (Current goals can be found in the Care Plan section)  Acute Rehab PT Goals Patient Stated Goal: return home PT Goal Formulation: With patient Time For Goal Achievement: 10/27/22 Potential to Achieve Goals: Good Additional Goals Additional Goal #1: Will increase L LE strength to 5/5 for improved transfers/ambulation    Frequency Min 4X/week     Co-evaluation               AM-PAC PT "6 Clicks" Mobility  Outcome Measure Help needed turning from your back to your side while in a flat bed without using bedrails?: None Help needed moving from lying on your back to sitting on the side of a flat bed without using bedrails?: A Little Help needed moving to and from a bed to a chair (including a wheelchair)?: A Little Help needed standing up from a chair using your arms (e.g., wheelchair or bedside chair)?: A Little Help needed to walk in hospital room?: Total Help needed climbing 3-5 steps with a railing? : Total 6 Click Score: 15    End of Session Equipment Utilized During Treatment:  Gait belt Activity Tolerance: Patient tolerated treatment well Patient left: in bed;with call bell/phone within reach;with bed alarm set Nurse Communication: Mobility status PT Visit Diagnosis: Other abnormalities of gait and mobility (R26.89);Hemiplegia and hemiparesis Hemiplegia - Right/Left: Left Hemiplegia - dominant/non-dominant: Non-dominant Hemiplegia - caused by: Unspecified    Time: 4835-0757 PT Time Calculation (min) (ACUTE ONLY): 22 min   Charges:    PT Evaluation $PT Eval Low Complexity: 1 Low          Danisa Kopec, PT Acute Rehab [facility] [phone]   Karlton Lemon 10/13/2022, 1:11 PM

## 2022-10-13 NOTE — Progress Notes (Signed)
  Echocardiogram 2D Echocardiogram has been performed.  Pamela Cherry 10/13/2022, 3:20 PM

## 2022-10-13 NOTE — TOC Initial Note (Signed)
Transition of Care [facility]) - Initial/Assessment Note    Patient Details  Name: Pamela Cherry MRN: [id number] Date of Birth: 15-Feb-1951  [facility] CM/SW Contact:    Pollie Friar, RN Phone Number: 10/13/2022, 4:00 PM  Clinical Narrative:                 Pt is from home with spouse. Pt provides assistance to spouse at home. He is wheelchair bound.  Pt drives self and manages her own medications without issues.  Pt doesn't have any other family to provide assistance.  Home health ordered and pt asked to use agency she has used in the past. [facility] has accepted and information on the AVS.  TOC following for further d/c needs.    Expected Discharge Plan: [place] Barriers to Discharge: Continued Medical Work up   Patient Goals and CMS Choice   CMS Medicare.gov Compare Post Acute Care list provided to:: Patient Choice offered to / list presented to : Patient  Expected Discharge Plan and Services Expected Discharge Plan: [place]   Discharge Planning Services: CM Consult Post Acute Care Choice: [place] arrangements for the past 2 months: [place] Arranged: PT, OT HH Agency: [place] Date [place]: 10/13/22   Representative spoke with at [place]: Tommi Rumps  Prior Living Arrangements/Services Living arrangements for the past 2 months: [place] Lives with:: Spouse Patient language and need for interpreter reviewed:: Yes Do you feel safe going back to the place where you live?: Yes          Current home services: DME (wheelchair/ walker/ rollator/ cane/ shower seat) Criminal Activity/Legal Involvement Pertinent to Current Situation/Hospitalization: No - Comment as needed  Activities of Daily Living Home Assistive Devices/Equipment: Wheelchair, Environmental consultant (specify type), CBG Meter, Shower chair with back, Cane (specify quad or straight),  Dentures (specify type), Reacher, Eyeglasses (walker with 4 wheels, dentures upper and lower, toilet is raised) ADL Screening (condition at time of admission) Patient's cognitive ability adequate to safely complete daily activities?: Yes Is the patient deaf or have difficulty hearing?: No Does the patient have difficulty seeing, even when wearing glasses/contacts?: No Does the patient have difficulty concentrating, remembering, or making decisions?: No Patient able to express need for assistance with ADLs?: Yes Does the patient have difficulty dressing or bathing?: No Independently performs ADLs?: Yes (appropriate for developmental age) Does the patient have difficulty walking or climbing stairs?: No (uses walker and can do few stairs) Weakness of Legs: Left Weakness of Arms/Hands: Left  Permission Sought/Granted                  Emotional Assessment Appearance:: Appears stated age Attitude/Demeanor/Rapport: Engaged Affect (typically observed): Accepting Orientation: : Oriented to Self, Oriented to Place, Oriented to  Time, Oriented to Situation   Psych Involvement: No (comment)  Admission diagnosis:  Focal neurological deficit [R29.818] Left sided numbness [R20.0] Patient Active Problem List   Diagnosis Date Noted   Left sided numbness 10/12/2022   Hyperlipidemia associated with type 2 diabetes mellitus ([place]) 10/12/2022   Tobacco use 10/12/2022   Iron deficiency anemia 09/25/2021   Closed left hip fracture ([place]) 02/26/2021   Lumbar radiculopathy    Acquired hypothyroidism 01/13/2016   Alopecia 01/13/2016   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 [place]Phone: [phone]: [phone]    Social Determinants of Health (SDOH) Interventions    Readmission Risk Interventions     No data to display

## 2022-10-13 NOTE — Progress Notes (Signed)
  Echocardiogram 2D Echocardiogram attempted at 1305. Pt is eating. Will attempt later.  Pamela Cherry 10/13/2022, 1:09 PM
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
## 2022-10-13 NOTE — Care Management Obs Status (Signed)
[facility] NOTIFICATION   Patient Details  Name: JANISA LABUS MRN: [id number] Date of Birth: 03/09/1951   Medicare Observation Status Notification Given:  Yes    Pollie Friar, RN 10/13/2022, 4:04 PM

## 2022-10-13 NOTE — ED Notes (Signed)
Patient complaining of left shoulder patient which appears slightly swollen.  Patient offered tylenol patient refused to take tylenol reports she nor her husband takes tylenol and she isnt going too.  MD paged and awaiting phone call.

## 2022-10-14 ENCOUNTER — Inpatient Hospital Stay (HOSPITAL_COMMUNITY): Payer: Medicare HMO

## 2022-10-14 ENCOUNTER — Inpatient Hospital Stay (HOSPITAL_COMMUNITY)
Admission: EM | Disposition: A | Discharge status: Home Health | Payer: Self-pay | Source: Home / Self Care | Attending: Internal Medicine

## 2022-10-14 ENCOUNTER — Encounter (HOSPITAL_COMMUNITY): Payer: Self-pay | Admitting: Internal Medicine

## 2022-10-14 ENCOUNTER — Other Ambulatory Visit: Payer: Self-pay

## 2022-10-14 ENCOUNTER — Inpatient Hospital Stay (HOSPITAL_COMMUNITY): Payer: Medicare HMO | Admitting: Anesthesiology

## 2022-10-14 DIAGNOSIS — Z7982 Long term (current) use of aspirin: Secondary | ICD-10-CM | POA: Diagnosis not present

## 2022-10-14 DIAGNOSIS — Z7984 Long term (current) use of oral hypoglycemic drugs: Secondary | ICD-10-CM | POA: Diagnosis not present

## 2022-10-14 DIAGNOSIS — Z79890 Hormone replacement therapy (postmenopausal): Secondary | ICD-10-CM | POA: Diagnosis not present

## 2022-10-14 DIAGNOSIS — E890 Postprocedural hypothyroidism: Secondary | ICD-10-CM | POA: Diagnosis present

## 2022-10-14 DIAGNOSIS — F17210 Nicotine dependence, cigarettes, uncomplicated: Secondary | ICD-10-CM

## 2022-10-14 DIAGNOSIS — E785 Hyperlipidemia, unspecified: Secondary | ICD-10-CM | POA: Diagnosis present

## 2022-10-14 DIAGNOSIS — Z791 Long term (current) use of non-steroidal anti-inflammatories (NSAID): Secondary | ICD-10-CM | POA: Diagnosis not present

## 2022-10-14 DIAGNOSIS — Z833 Family history of diabetes mellitus: Secondary | ICD-10-CM | POA: Diagnosis not present

## 2022-10-14 DIAGNOSIS — R200 Anesthesia of skin: Secondary | ICD-10-CM | POA: Diagnosis not present

## 2022-10-14 DIAGNOSIS — G9520 Unspecified cord compression: Secondary | ICD-10-CM

## 2022-10-14 DIAGNOSIS — D638 Anemia in other chronic diseases classified elsewhere: Secondary | ICD-10-CM | POA: Diagnosis not present

## 2022-10-14 DIAGNOSIS — R509 Fever, unspecified: Secondary | ICD-10-CM | POA: Diagnosis not present

## 2022-10-14 DIAGNOSIS — E1169 Type 2 diabetes mellitus with other specified complication: Secondary | ICD-10-CM | POA: Diagnosis present

## 2022-10-14 DIAGNOSIS — R29818 Other symptoms and signs involving the nervous system: Secondary | ICD-10-CM | POA: Diagnosis present

## 2022-10-14 DIAGNOSIS — M4322 Fusion of spine, cervical region: Secondary | ICD-10-CM | POA: Diagnosis not present

## 2022-10-14 DIAGNOSIS — I10 Essential (primary) hypertension: Secondary | ICD-10-CM

## 2022-10-14 DIAGNOSIS — R1310 Dysphagia, unspecified: Secondary | ICD-10-CM | POA: Diagnosis present

## 2022-10-14 DIAGNOSIS — Z79899 Other long term (current) drug therapy: Secondary | ICD-10-CM | POA: Diagnosis not present

## 2022-10-14 DIAGNOSIS — G959 Disease of spinal cord, unspecified: Secondary | ICD-10-CM

## 2022-10-14 DIAGNOSIS — Z89511 Acquired absence of right leg below knee: Secondary | ICD-10-CM | POA: Diagnosis not present

## 2022-10-14 DIAGNOSIS — M4712 Other spondylosis with myelopathy, cervical region: Secondary | ICD-10-CM | POA: Diagnosis present

## 2022-10-14 DIAGNOSIS — Z91048 Other nonmedicinal substance allergy status: Secondary | ICD-10-CM | POA: Diagnosis not present

## 2022-10-14 DIAGNOSIS — Z8249 Family history of ischemic heart disease and other diseases of the circulatory system: Secondary | ICD-10-CM | POA: Diagnosis not present

## 2022-10-14 DIAGNOSIS — I152 Hypertension secondary to endocrine disorders: Secondary | ICD-10-CM | POA: Diagnosis present

## 2022-10-14 DIAGNOSIS — E039 Hypothyroidism, unspecified: Secondary | ICD-10-CM | POA: Diagnosis not present

## 2022-10-14 DIAGNOSIS — E669 Obesity, unspecified: Secondary | ICD-10-CM | POA: Diagnosis present

## 2022-10-14 DIAGNOSIS — Z6835 Body mass index (BMI) 35.0-35.9, adult: Secondary | ICD-10-CM | POA: Diagnosis not present

## 2022-10-14 DIAGNOSIS — M4802 Spinal stenosis, cervical region: Secondary | ICD-10-CM | POA: Diagnosis present

## 2022-10-14 DIAGNOSIS — M25511 Pain in right shoulder: Secondary | ICD-10-CM | POA: Diagnosis not present

## 2022-10-14 DIAGNOSIS — Z716 Tobacco abuse counseling: Secondary | ICD-10-CM | POA: Diagnosis not present

## 2022-10-14 HISTORY — PX: ANTERIOR CERVICAL DECOMP/DISCECTOMY FUSION: SHX1161

## 2022-10-14 LAB — SURGICAL PCR SCREEN
MRSA, PCR: NEGATIVE
Staphylococcus aureus: NEGATIVE

## 2022-10-14 LAB — GLUCOSE, CAPILLARY
Glucose-Capillary: 137 mg/dL — ABNORMAL HIGH (ref 70–99)
Glucose-Capillary: 102 mg/dL — ABNORMAL HIGH (ref 70–99)
Glucose-Capillary: 81 mg/dL (ref 70–99)
Glucose-Capillary: 83 mg/dL (ref 70–99)
Glucose-Capillary: 112 mg/dL — ABNORMAL HIGH (ref 70–99)
Glucose-Capillary: 151 mg/dL — ABNORMAL HIGH (ref 70–99)

## 2022-10-14 SURGERY — ANTERIOR CERVICAL DECOMPRESSION/DISCECTOMY FUSION 2 LEVELS
Anesthesia: General | Site: Neck

## 2022-10-14 MED ORDER — ARTIFICIAL TEARS OPHTHALMIC OINT
TOPICAL_OINTMENT | OPHTHALMIC | Status: DC | PRN
  Administered 2022-10-14: 1 via OPHTHALMIC

## 2022-10-14 MED ORDER — PHENYLEPHRINE HCL (PRESSORS) 10 MG/ML IV SOLN
INTRAVENOUS | Status: DC | PRN
  Administered 2022-10-14: 80 ug via INTRAVENOUS

## 2022-10-14 MED ORDER — ROCURONIUM BROMIDE 100 MG/10ML IV SOLN
INTRAVENOUS | Status: DC | PRN
  Administered 2022-10-14: 10 mg via INTRAVENOUS
  Administered 2022-10-14: 50 mg via INTRAVENOUS
  Administered 2022-10-14 (×4): 10 mg via INTRAVENOUS

## 2022-10-14 MED ORDER — ACETAMINOPHEN 10 MG/ML IV SOLN
INTRAVENOUS | Status: AC
  Filled 2022-10-14: qty 100

## 2022-10-14 MED ORDER — EPHEDRINE SULFATE (PRESSORS) 50 MG/ML IJ SOLN
INTRAMUSCULAR | Status: DC | PRN
  Administered 2022-10-14: 5 mg via INTRAVENOUS

## 2022-10-14 MED ORDER — FENTANYL CITRATE (PF) 100 MCG/2ML IJ SOLN: 25.0000 ug | INTRAMUSCULAR | Status: DC | PRN

## 2022-10-14 MED ORDER — THROMBIN 5000 UNITS EX SOLR
OROMUCOSAL | Status: DC | PRN
  Administered 2022-10-14: 5 mL via TOPICAL

## 2022-10-14 MED ORDER — AMISULPRIDE (ANTIEMETIC) 5 MG/2ML IV SOLN: 10.0000 mg | Freq: Once | INTRAVENOUS | Status: DC | PRN

## 2022-10-14 MED ORDER — DEXAMETHASONE SODIUM PHOSPHATE 4 MG/ML IJ SOLN
INTRAMUSCULAR | Status: DC | PRN
  Administered 2022-10-14: 10 mg via INTRAVENOUS

## 2022-10-14 MED ORDER — ACETAMINOPHEN 10 MG/ML IV SOLN
INTRAVENOUS | Status: DC | PRN
  Administered 2022-10-14: 1000 mg via INTRAVENOUS

## 2022-10-14 MED ORDER — FENTANYL CITRATE (PF) 250 MCG/5ML IJ SOLN
INTRAMUSCULAR | Status: AC
  Filled 2022-10-14: qty 5

## 2022-10-14 MED ORDER — FENTANYL CITRATE (PF) 100 MCG/2ML IJ SOLN
INTRAMUSCULAR | Status: DC | PRN
  Administered 2022-10-14: 50 ug via INTRAVENOUS
  Administered 2022-10-14 (×2): 25 ug via INTRAVENOUS
  Administered 2022-10-14 (×4): 50 ug via INTRAVENOUS

## 2022-10-14 MED ORDER — THROMBIN (RECOMBINANT) 5000 UNITS EX SOLR
CUTANEOUS | Status: DC | PRN
  Administered 2022-10-14: 10 mL via TOPICAL

## 2022-10-14 MED ORDER — CEFAZOLIN SODIUM-DEXTROSE 2-4 GM/100ML-% IV SOLN
2.0000 g | INTRAVENOUS | Status: AC
  Administered 2022-10-14: 2 g via INTRAVENOUS
  Filled 2022-10-14: qty 100

## 2022-10-14 MED ORDER — THROMBIN 5000 UNITS EX SOLR
CUTANEOUS | Status: AC
  Filled 2022-10-14: qty 15000

## 2022-10-14 MED ORDER — LIDOCAINE-EPINEPHRINE 1 %-1:100000 IJ SOLN
INTRAMUSCULAR | Status: DC | PRN
  Administered 2022-10-14: 6 mL via INTRADERMAL

## 2022-10-14 MED ORDER — MIDAZOLAM HCL 5 MG/5ML IJ SOLN
INTRAMUSCULAR | Status: DC | PRN
  Administered 2022-10-14: 1 mg via INTRAVENOUS

## 2022-10-14 MED ORDER — LIDOCAINE-EPINEPHRINE 1 %-1:100000 IJ SOLN
INTRAMUSCULAR | Status: AC
  Filled 2022-10-14: qty 1

## 2022-10-14 MED ORDER — PHENYLEPHRINE HCL-NACL 20-0.9 MG/250ML-% IV SOLN
INTRAVENOUS | Status: DC | PRN
  Administered 2022-10-14: 25 ug/min via INTRAVENOUS

## 2022-10-14 MED ORDER — ONDANSETRON HCL 4 MG/2ML IJ SOLN
INTRAMUSCULAR | Status: DC | PRN
  Administered 2022-10-14: 4 mg via INTRAVENOUS

## 2022-10-14 MED ORDER — MIDAZOLAM HCL 2 MG/2ML IJ SOLN
INTRAMUSCULAR | Status: AC
  Filled 2022-10-14: qty 2

## 2022-10-14 MED ORDER — POTASSIUM CHLORIDE IN NACL 20-0.9 MEQ/L-% IV SOLN
INTRAVENOUS | Status: DC
  Filled 2022-10-14: qty 1000

## 2022-10-14 MED ORDER — PHENYLEPHRINE 80 MCG/ML (10ML) SYRINGE FOR IV PUSH (FOR BLOOD PRESSURE SUPPORT)
PREFILLED_SYRINGE | INTRAVENOUS | Status: AC
  Filled 2022-10-14: qty 10

## 2022-10-14 MED ORDER — PROPOFOL 10 MG/ML IV BOLUS
INTRAVENOUS | Status: DC | PRN
  Administered 2022-10-14: 150 mg via INTRAVENOUS

## 2022-10-14 MED ORDER — 0.9 % SODIUM CHLORIDE (POUR BTL) OPTIME
TOPICAL | Status: DC | PRN
  Administered 2022-10-14: 1000 mL

## 2022-10-14 MED ORDER — PROPOFOL 10 MG/ML IV BOLUS
INTRAVENOUS | Status: AC
  Filled 2022-10-14: qty 20

## 2022-10-14 MED ORDER — FENTANYL CITRATE (PF) 100 MCG/2ML IJ SOLN
INTRAMUSCULAR | Status: AC
  Filled 2022-10-14: qty 2

## 2022-10-14 MED ORDER — LACTATED RINGERS IV SOLN: INTRAVENOUS | Status: DC | PRN

## 2022-10-14 MED ORDER — LIDOCAINE HCL (CARDIAC) PF 50 MG/5ML IV SOSY
PREFILLED_SYRINGE | INTRAVENOUS | Status: DC | PRN
  Administered 2022-10-14: 60 mg via INTRAVENOUS

## 2022-10-14 MED ORDER — BUPIVACAINE HCL (PF) 0.5 % IJ SOLN
INTRAMUSCULAR | Status: AC
  Filled 2022-10-14: qty 30

## 2022-10-14 MED ORDER — SUGAMMADEX SODIUM 200 MG/2ML IV SOLN
INTRAVENOUS | Status: DC | PRN
  Administered 2022-10-14: 200 mg via INTRAVENOUS

## 2022-10-14 MED ORDER — MUPIROCIN 2 % EX OINT
1.0000 | TOPICAL_OINTMENT | Freq: Two times a day (BID) | CUTANEOUS | Status: DC
  Administered 2022-10-15 – 2022-10-19 (×9): 1 via NASAL
  Filled 2022-10-14: qty 22

## 2022-10-14 SURGICAL SUPPLY — 67 items
BAG COUNTER SPONGE SURGICOUNT (BAG) ×1 IMPLANT
BAND RUBBER #18 3X1/16 STRL (MISCELLANEOUS) ×2 IMPLANT
BASKET BONE COLLECTION (BASKET) IMPLANT
BENZOIN TINCTURE PRP APPL 2/3 (GAUZE/BANDAGES/DRESSINGS) ×1 IMPLANT
BIT DRILL NEURO 2X3.1 SFT TUCH (MISCELLANEOUS) ×1 IMPLANT
BLADE CLIPPER SURG (BLADE) IMPLANT
BLADE SURG 15 STRL LF DISP TIS (BLADE) IMPLANT
BLADE SURG 15 STRL SS (BLADE)
BLADE ULTRA TIP 2M (BLADE) IMPLANT
BUR MATCHSTICK NEURO 3.0 LAGG (BURR) ×1 IMPLANT
CANISTER SUCT 3000ML PPV (MISCELLANEOUS) ×1 IMPLANT
DEVICE ENDSKLTN IMPL 16X14X7X6 (Cage) IMPLANT
DEVICE ENDSKLTN IMPLANT SM 7MM (Cage) IMPLANT
DRAPE C-ARM 42X72 X-RAY (DRAPES) ×2 IMPLANT
DRAPE HALF SHEET 40X57 (DRAPES) IMPLANT
DRAPE LAPAROTOMY 100X72 PEDS (DRAPES) ×1 IMPLANT
DRAPE MICROSCOPE SLANT 54X150 (MISCELLANEOUS) ×1 IMPLANT
DRESSING MEPILEX FLEX 4X4 (GAUZE/BANDAGES/DRESSINGS) ×1 IMPLANT
DRILL NEURO 2X3.1 SOFT TOUCH (MISCELLANEOUS) ×2
DRSG MEPILEX FLEX 4X4 (GAUZE/BANDAGES/DRESSINGS) ×1
DRSG OPSITE 4X5.5 SM (GAUZE/BANDAGES/DRESSINGS) ×2 IMPLANT
DRSG OPSITE POSTOP 4X6 (GAUZE/BANDAGES/DRESSINGS) IMPLANT
DURAPREP 26ML APPLICATOR (WOUND CARE) ×1 IMPLANT
ELECT COATED BLADE 2.86 ST (ELECTRODE) ×1 IMPLANT
ELECT REM PT RETURN 9FT ADLT (ELECTROSURGICAL) ×1
ELECTRODE REM PT RTRN 9FT ADLT (ELECTROSURGICAL) ×1 IMPLANT
ENDOSKELETON IMPLANT 16X14X7X6 (Cage) ×1 IMPLANT
ENDOSKELETON IMPLANT SM 7MM (Cage) ×1 IMPLANT
GAUZE 4X4 16PLY ~~LOC~~+RFID DBL (SPONGE) IMPLANT
GLOVE BIOGEL PI IND STRL 7.5 (GLOVE) ×1 IMPLANT
GLOVE ECLIPSE 7.5 STRL STRAW (GLOVE) ×1 IMPLANT
GLOVE SURG ENC MOIS LTX SZ8 (GLOVE) ×1 IMPLANT
GLOVE SURG UNDER POLY LF SZ8.5 (GLOVE) ×1 IMPLANT
GOWN STRL REUS W/ TWL LRG LVL3 (GOWN DISPOSABLE) ×2 IMPLANT
GOWN STRL REUS W/ TWL XL LVL3 (GOWN DISPOSABLE) ×1 IMPLANT
GOWN STRL REUS W/TWL 2XL LVL3 (GOWN DISPOSABLE) IMPLANT
GOWN STRL REUS W/TWL LRG LVL3 (GOWN DISPOSABLE) ×2
GOWN STRL REUS W/TWL XL LVL3 (GOWN DISPOSABLE) ×2
HEMOSTAT POWDER KIT SURGIFOAM (HEMOSTASIS) ×1 IMPLANT
KIT BASIN OR (CUSTOM PROCEDURE TRAY) ×1 IMPLANT
KIT TURNOVER KIT B (KITS) ×1 IMPLANT
MARKER SKIN DUAL TIP RULER LAB (MISCELLANEOUS) IMPLANT
NDL SPNL 22GX3.5 QUINCKE BK (NEEDLE) ×1 IMPLANT
NEEDLE HYPO 22GX1.5 SAFETY (NEEDLE) ×1 IMPLANT
NEEDLE SPNL 22GX3.5 QUINCKE BK (NEEDLE) ×2 IMPLANT
NS IRRIG 1000ML POUR BTL (IV SOLUTION) ×1 IMPLANT
PACK LAMINECTOMY NEURO (CUSTOM PROCEDURE TRAY) ×1 IMPLANT
PAD ARMBOARD 7.5X6 YLW CONV (MISCELLANEOUS) ×3 IMPLANT
PIN DISTRACTION 14MM (PIN) IMPLANT
PLATE ZEVO 2LVL 37MM (Plate) IMPLANT
PUTTY DBF 1CC CORTICAL FIBERS (Putty) IMPLANT
SCREW VA SD 3.5X16 (Screw) IMPLANT
SPIKE FLUID TRANSFER (MISCELLANEOUS) ×1 IMPLANT
SPONGE INTESTINAL PEANUT (DISPOSABLE) ×1 IMPLANT
SPONGE SURGIFOAM ABS GEL SZ50 (HEMOSTASIS) ×1 IMPLANT
STAPLER VISISTAT 35W (STAPLE) IMPLANT
STRIP CLOSURE SKIN 1/2X4 (GAUZE/BANDAGES/DRESSINGS) ×1 IMPLANT
SUT MNCRL AB 4-0 PS2 18 (SUTURE) ×1 IMPLANT
SUT SILK 2 0 TIES 10X30 (SUTURE) IMPLANT
SUT VIC AB 0 CT1 27 (SUTURE) ×1
SUT VIC AB 0 CT1 27XBRD ANTBC (SUTURE) IMPLANT
SUT VIC AB 2-0 CP2 18 (SUTURE) IMPLANT
SUT VIC AB 3-0 SH 8-18 (SUTURE) ×1 IMPLANT
TAPE CLOTH 3X10 TAN LF (GAUZE/BANDAGES/DRESSINGS) ×1 IMPLANT
TOWEL GREEN STERILE (TOWEL DISPOSABLE) ×1 IMPLANT
TOWEL GREEN STERILE FF (TOWEL DISPOSABLE) ×1 IMPLANT
WATER STERILE IRR 1000ML POUR (IV SOLUTION) ×1 IMPLANT

## 2022-10-14 NOTE — Progress Notes (Signed)
PROGRESS NOTE    Pamela Cherry  QIH:[id number] DOB: December 08, 1951 DOA: 10/12/2022 PCP: Susy Frizzle, MD   Brief Narrative: 71 year old with past medical history significant for diabetes type 2, hypertension, hyperlipidemia, hypothyroidism, status post right BKA, tobacco use, chronic numbness of the left fourth and fifth finger who presented to the ED for evaluation of new numbness and weakness on her left upper and lower extremities.  Over the last 1 or 2 weeks he has noted intermittent numbness involving the whole left hand.  The day of admission around 1530 she developed new numbness involving her left arm.  Subsequently developed left leg and left face numbness.  No associated focal weakness, change in speech or change in vision. CT head without contrast was negative for acute infarct.  Mild white matter hypodensity in the frontal lobe bilaterally seen most likely due to chronic microvascular ischemia.  Patient has been admitted for further CVA work-up.  Assessment & Plan:   Principal Problem:   Left sided numbness Active Problems:   Hypertension associated with diabetes ([place])   Type 2 diabetes mellitus without complication (HCC)   Acquired hypothyroidism   Hyperlipidemia associated with type 2 diabetes mellitus (HCC)   Tobacco use  1-Left side  numbness: left arm and leg.  -MRI: No acute intracranial abnormality. Empty sella. -B 12: 183. Started  supplement.  -CTA: Negative for CTA large vessel occlusion.  Moderate atheromatous changes in the right carotid bulb/proximal right ICA with associated stenosis up to 65%.--Neurology recommendation -Cervical MRI showed:  Left paracentral disc osteophyte complex at C4-5 with resultant severe spinal stenosis, with severe bilateral C5 foraminal narrowing. Associated cord flattening with chronic myelomalacia involving the left greater than right cord at this level.. Right paracentral disc osteophyte complex at C5-6 with resultant moderate  spinal stenosis. Left-sided uncovertebral spurring with resultant moderate left C6 foraminal stenosis. Small left paracentral disc protrusion at C2-3 with resultant mild spinal stenosis. Small central disc protrusion at C3-4 with resultant mild spinal stenosis. -Neurosurgery consulted, plan for C4-C5, C5-C6 ACDF.  2-Hypertension associated with diabetes Resume Olmersartan.   3-Diabetes type 2: SSI.  Hold metformin and glipizide while inpatient  4-Tobacco  use: Nicotine patch Counseling.   5-Hyperlipidemia Continue with Crestor  6-Acquired hypothyroidism TSH at  10, will increase Synthroid to 150 mcg  Right shoulder pain; X ray negative.  MRI: Mild to moderate mid anterior infraspinatus tendinosis with focal partial thickness tear of the measure only 4 mm in AP dimension but involve the majority of the transverse dimension of the tendon and extending through the bursal tendon surface.  Longitudinal instrumentation tear with the deep aspect of the mid AP dimension of the infraspinatus muscle measuring up to 15 mm in craniocaudal dimension.  Long head of the biceps tendinosis mild to moderate, mild degenerative changes of the acromion clavicular joint. She is not having significant pain today, and is able to raise left arm    Estimated body mass index is 35.72 kg/m as calculated from the following:   Height as of this encounter: '5\' 4"'$  (1.626 m).   Weight as of this encounter: 94.4 kg.   DVT prophylaxis: Lovenox Code Status: Full code Family Communication: care discussed with patient.  Disposition Plan:  Status is: Observation The patient remains OBS appropriate and will d/c before 2 midnights.    Consultants:  Neurology   Procedures:  ECHO   Antimicrobials:    Subjective: She report numbness stable. Pain left arm has improved, she is able to move  her left arm better.  Objective: Vitals:   10/14/22 0000 10/14/22 0333 10/14/22 0818 10/14/22 1117  BP: (!) 157/43  138/64 (!) 120/53 136/69  Pulse: 73 65 74 71  Resp: '12 17 18 18  '$ Temp: 98.8 F (37.1 C) 98.2 F (36.8 C) 98.2 F (36.8 C) 98 F (36.7 C)  TempSrc: Oral Oral Oral Oral  SpO2:  94% 94% 96%  Weight:      Height:        Intake/Output Summary (Last 24 hours) at 10/14/2022 1327 Last data filed at 10/14/2022 0400 Gross per 24 hour  Intake --  Output 1450 ml  Net -1450 ml   Filed Weights   10/12/22 1700 10/12/22 1757  Weight: 94.4 kg 94.4 kg    Examination:  General exam: NAD Respiratory system: CTA Cardiovascular system: S 1, S  2 RRR Gastrointestinal system: BS present, soft, nt Central nervous system: Alert, follows command Extremities: Symmetric 5 x 5 power.   Data Reviewed: I have personally reviewed following labs and imaging studies  CBC: Recent Labs  Lab 10/12/22 1727 10/12/22 1732 10/13/22 0307  WBC 7.8  --  5.6  NEUTROABS 5.7  --   --   HGB 11.3* 12.6 10.0*  HCT 36.8 37.0 32.8*  MCV 78.5*  --  79.2*  PLT 236  --  034    Basic Metabolic Panel: Recent Labs  Lab 10/12/22 1727 10/12/22 1732 10/13/22 0307  NA 141 138 139  K 4.3 4.2 3.6  CL 104 103 105  CO2 24  --  23  GLUCOSE 198* 194* 131*  BUN '12 13 12  '$ CREATININE 0.78 0.60 0.68  CALCIUM 9.4  --  8.9    GFR: Estimated Creatinine Clearance: 70.8 mL/min (by C-G formula based on SCr of 0.68 mg/dL). Liver Function Tests: Recent Labs  Lab 10/12/22 1727  AST 30  ALT 13  ALKPHOS 84  BILITOT 0.7  PROT 6.6  ALBUMIN 3.5    No results for input(s): "LIPASE", "AMYLASE" in the last 168 hours. No results for input(s): "AMMONIA" in the last 168 hours. Coagulation Profile: Recent Labs  Lab 10/12/22 1727  INR 1.1    Cardiac Enzymes: No results for input(s): "CKTOTAL", "CKMB", "CKMBINDEX", "TROPONINI" in the last 168 hours. BNP (last 3 results) No results for input(s): "PROBNP" in the last 8760 hours. HbA1C: Recent Labs    10/13/22 0307  HGBA1C 6.3*    CBG: Recent Labs  Lab  10/13/22 1243 10/13/22 1549 10/13/22 2229 10/14/22 0850 10/14/22 1118  GLUCAP 130* 163* 142* 137* 102*    Lipid Profile: Recent Labs    10/13/22 0307 10/13/22 0849  CHOL 167  --   HDL 27*  --   LDLCALC NOT CALCULATED  --   TRIG 194*  --   CHOLHDL 6.2  --   LDLDIRECT  --  104*    Thyroid Function Tests: Recent Labs    10/13/22 0849  TSH 10.183*   Anemia Panel: Recent Labs    10/13/22 0849  VITAMINB12 183   Sepsis Labs: No results for input(s): "PROCALCITON", "LATICACIDVEN" in the last 168 hours.  No results found for this or any previous visit (from the past 240 hour(s)).       Radiology Studies: MR SHOULDER LEFT WO CONTRAST  Result Date: 10/14/2022 CLINICAL DATA:  Shoulder pain.  Rotator cuff disorder suspected. EXAM: MRI OF THE LEFT SHOULDER WITHOUT CONTRAST TECHNIQUE: Multiplanar, multisequence MR imaging of the shoulder was performed. No intravenous contrast was administered.  COMPARISON:  Left shoulder radiographs 10/13/2022 FINDINGS: Rotator cuff: There is mild-to-moderate intermediate T2 signal and thickening tendinosis of the mid to anterior infraspinatus tendon footprint (coronal series 19 images 11 through 13). Within this tendinosis there is horizontal linear fluid bright signal indicating a partial-thickness tear that focally extends through the bursal tendon surface (sagittal series 21, images 21 and 22, coronal series 19 image 11). This tear measures up to 4 mm in AP dimension and 5 mm in transverse dimension, focally involving nearly the entire transverse dimension of the tendon, however there is no tendon retraction and this does not appear to extend through the articular tendon surface. There is longitudinal linear increased T2 signal with portions that are fluid bright at the deep aspect of the mid AP dimension of the infraspinatus muscle (sagittal series 21 images 1 through 17). This is suggestive of an interstitial muscle tear, measuring up to 15 mm in  craniocaudal dimension and 9 mm along the AP thickness of the muscle (as measured on sagittal image 11). The supraspinatus, subscapularis and teres minor are intact. Muscles:  No rotator cuff muscle atrophy or fatty infiltration. Biceps long head: Mild-to-moderate intermediate T2 signal and thickening tendinosis of the long head of the biceps tendon proximal to the bicipital groove. Acromioclavicular Joint: There are mild degenerative changes of the acromioclavicular joint including joint space narrowing, subchondral marrow edema, and peripheral osteophytosis. Type III acromion with mild downsloping of the anterolateral acromion. Trace fluid within the subacromial/subdeltoid bursa. Glenohumeral Joint: Mild-to-moderate thinning of the glenoid and humeral head cartilage. Labrum: Mild degenerative irregularity of the peripheral tip of the posterior glenoid labrum (axial series 18 images 10 through 12). Bones:  No acute fracture. Other: None. IMPRESSION: 1. Mild-to-moderate mid to anterior infraspinatus tendinosis with focal partial-thickness tear of the measures only 4 mm AP dimension but involves the majority of the transverse dimension of the tendon and extends through the bursal tendon surface. 2. Longitudinal interstitial tear within the deep aspect of the mid AP dimension of the infraspinatus muscle, measuring up to 15 mm in craniocaudal dimension. 3. Mild-to-moderate proximal long head of the biceps tendinosis. 4. Mild degenerative changes of the acromioclavicular joint. Type 3 acromion with mild downsloping of the anterolateral acromion. 5. Mild-to-moderate thinning of the glenoid and humeral head cartilage. Electronically Signed   By: Yvonne Kendall M.D.   On: 10/14/2022 08:38   MR CERVICAL SPINE WO CONTRAST  Result Date: 10/14/2022 CLINICAL DATA:  Initial evaluation for cervical radiculopathy. Chronic numbness of the left fourth and fifth digits. EXAM: MRI CERVICAL SPINE WITHOUT CONTRAST TECHNIQUE:  Multiplanar, multisequence MR imaging of the cervical spine was performed. No intravenous contrast was administered. COMPARISON:  Prior study from 02/06/2021. FINDINGS: Alignment: Straightening of the normal cervical lordosis. No listhesis. Vertebrae: Vertebral body height maintained without acute or chronic fracture. Bone marrow signal intensity within normal limits. No worrisome osseous lesions or abnormal marrow edema. Cord: Chronic myelomalacia seen involving the left greater than right cord at the level of C4-5 (series 8, image 23). Otherwise normal signal and morphology. Posterior Fossa, vertebral arteries, paraspinal tissues: Empty sella. Craniocervical junction normal. Paraspinous soft tissues demonstrate no acute finding. Normal flow voids seen within the vertebral arteries bilaterally. Disc levels: A degree of underlying congenital spinal stenosis noted. C2-C3: Small left paracentral disc protrusion with inferior migration indents the ventral thecal sac (series 8, image 13). Mild cord flattening without cord signal changes. Mild spinal stenosis. Left-sided uncovertebral spurring with resultant mild left C3 foraminal stenosis. Right  neural foramen remains patent. C3-C4: Small central disc protrusion indents the ventral thecal sac (series 9, image 17). Mild spinal stenosis without frank cord impingement. Foramina remain patent. C4-C5: Irregular broad-based left paracentral disc osteophyte complex indents and effaces the ventral thecal sac (series 9, image 22). Secondary cord flattening with associated chronic myelomalacia. Mild ligament flavum hypertrophy. Resultant severe spinal stenosis with the thecal sac measuring 3-4 mm in AP diameter at its most narrow point. Superimposed uncovertebral spurring with resultant severe bilateral C5 foraminal stenosis. C5-C6: Right paracentral disc osteophyte complex indents the right ventral thecal sac, contacting and flattening the right hemicord (series 9, image 27). No  cord signal changes. Moderate spinal stenosis. Left worse than right uncovertebral spurring with resultant moderate left C6 foraminal stenosis. Right neural foramen remains patent. C6-C7: Bilateral uncovertebral spurring with minimal disc bulge. Mild facet hypertrophy. No spinal stenosis. Mild bilateral C7 foraminal narrowing. C7-T1:  Negative interspace.  Mild facet hypertrophy.  No stenosis. Overall, these changes are similar as compared to previous MRI from 02/06/2021. IMPRESSION: 1. Left paracentral disc osteophyte complex at C4-5 with resultant severe spinal stenosis, with severe bilateral C5 foraminal narrowing. Associated cord flattening with chronic myelomalacia involving the left greater than right cord at this level. 2. Right paracentral disc osteophyte complex at C5-6 with resultant moderate spinal stenosis. Left-sided uncovertebral spurring with resultant moderate left C6 foraminal stenosis. 3. Small left paracentral disc protrusion at C2-3 with resultant mild spinal stenosis. 4. Small central disc protrusion at C3-4 with resultant mild spinal stenosis. Electronically Signed   By: Jeannine Boga M.D.   On: 10/14/2022 04:10   ECHOCARDIOGRAM COMPLETE  Result Date: 10/13/2022    ECHOCARDIOGRAM REPORT   Patient Name:   Pamela Cherry Date of Exam: 10/13/2022 Medical Rec #:  [id number]     Height:       64.0 in Accession #:    [id number]    Weight:       208.1 lb Date of Birth:  1951-08-20    BSA:          1.990 m Patient Age:    58 years      BP:           160/74 mmHg Patient Gender: F             HR:           71 bpm. Exam Location:  Inpatient Procedure: 2D Echo, Color Doppler, Cardiac Doppler and Intracardiac            Opacification Agent Indications:    TIA  History:        Patient has no prior history of Echocardiogram examinations.                 Risk Factors:Diabetes, Hypertension, Dyslipidemia and Current                 Smoker.  Sonographer:    Eartha Inch Referring Phys: [id number] VISHAL  R PATEL  Sonographer Comments: Technically difficult study due to poor echo windows. Image acquisition challenging due to patient body habitus and Image acquisition challenging due to respiratory motion. IMPRESSIONS  1. Left ventricular ejection fraction, by estimation, is 60 to 65%. The left ventricle has normal function. The left ventricle has no regional wall motion abnormalities. There is moderate concentric left ventricular hypertrophy. Left ventricular diastolic parameters are consistent with Grade I diastolic dysfunction (impaired relaxation).  2. Right ventricular systolic function is normal. The right ventricular size is normal.  Tricuspid regurgitation signal is inadequate for assessing PA pressure.  3. The mitral valve is normal in structure. No evidence of mitral valve regurgitation. No evidence of mitral stenosis. Moderate mitral annular calcification.  4. The aortic valve is tricuspid. There is mild calcification of the aortic valve. Aortic valve regurgitation is not visualized. No aortic stenosis is present.  5. The inferior vena cava is normal in size with greater than 50% respiratory variability, suggesting right atrial pressure of 3 mmHg. FINDINGS  Left Ventricle: Left ventricular ejection fraction, by estimation, is 60 to 65%. The left ventricle has normal function. The left ventricle has no regional wall motion abnormalities. Definity contrast agent was given IV to delineate the left ventricular  endocardial borders. The left ventricular internal cavity size was normal in size. There is moderate concentric left ventricular hypertrophy. Left ventricular diastolic parameters are consistent with Grade I diastolic dysfunction (impaired relaxation). Right Ventricle: The right ventricular size is normal. No increase in right ventricular wall thickness. Right ventricular systolic function is normal. Tricuspid regurgitation signal is inadequate for assessing PA pressure. Left Atrium: Left atrial size was  normal in size. Right Atrium: Right atrial size was normal in size. Pericardium: Trivial pericardial effusion is present. Mitral Valve: The mitral valve is normal in structure. There is mild calcification of the mitral valve leaflet(s). Moderate mitral annular calcification. No evidence of mitral valve regurgitation. No evidence of mitral valve stenosis. MV peak gradient, 6.5 mmHg. The mean mitral valve gradient is 3.0 mmHg. Tricuspid Valve: The tricuspid valve is normal in structure. Tricuspid valve regurgitation is trivial. Aortic Valve: The aortic valve is tricuspid. There is mild calcification of the aortic valve. Aortic valve regurgitation is not visualized. No aortic stenosis is present. Pulmonic Valve: The pulmonic valve was normal in structure. Pulmonic valve regurgitation is not visualized. Aorta: The aortic root is normal in size and structure. Venous: The inferior vena cava is normal in size with greater than 50% respiratory variability, suggesting right atrial pressure of 3 mmHg. IAS/Shunts: No atrial level shunt detected by color flow Doppler.  LEFT VENTRICLE PLAX 2D LVIDd:         4.00 cm   Diastology LVIDs:         2.70 cm   LV e' medial:    5.43 cm/s LV PW:         1.10 cm   LV E/e' medial:  16.0 LV IVS:        1.00 cm   LV e' lateral:   6.22 cm/s LVOT diam:     2.00 cm   LV E/e' lateral: 14.0 LV SV:         84 LV SV Index:   42 LVOT Area:     3.14 cm  RIGHT VENTRICLE             IVC RV S prime:     15.50 cm/s  IVC diam: 1.50 cm TAPSE (M-mode): 2.3 cm LEFT ATRIUM           Index        RIGHT ATRIUM           Index LA diam:      3.60 cm 1.81 cm/m   RA Area:     10.90 cm LA Vol (A4C): 76.8 ml 38.60 ml/m  RA Volume:   19.90 ml  10.00 ml/m  AORTIC VALVE LVOT Vmax:   117.00 cm/s LVOT Vmean:  82.600 cm/s LVOT VTI:    0.268 m  AORTA  Ao Root diam: 3.20 cm Ao Asc diam:  3.20 cm MITRAL VALVE MV Area (PHT): 2.13 cm     SHUNTS MV Area VTI:   2.45 cm     Systemic VTI:  0.27 m MV Peak grad:  6.5 mmHg      Systemic Diam: 2.00 cm MV Mean grad:  3.0 mmHg MV Vmax:       1.27 m/s MV Vmean:      77.0 cm/s MV Decel Time: 356 msec MV E velocity: 86.90 cm/s MV A velocity: 119.00 cm/s MV E/A ratio:  0.73 Dalton McleanMD Electronically signed by Franki Monte Signature Date/Time: 10/13/2022/3:34:00 PM    Final    DG Shoulder Left  Result Date: 10/13/2022 CLINICAL DATA:  Shoulder pain EXAM: LEFT SHOULDER - 2+ VIEW COMPARISON:  None Available. FINDINGS: No evidence of acute fracture. Alignment is normal. 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 IMPRESSION: No acute fracture or dislocation. Mild glenohumeral and AC joint osteoarthritis. Electronically Signed   By: Maurine Simmering M.D.   On: 10/13/2022 09:27   MR BRAIN WO CONTRAST  Result Date: 10/12/2022 CLINICAL DATA:  Initial evaluation for neuro deficit, stroke suspected. EXAM: MRI HEAD WITHOUT CONTRAST TECHNIQUE: Multiplanar, multiecho pulse sequences of the brain and surrounding structures were obtained without intravenous contrast. COMPARISON:  CTs from earlier the same day. FINDINGS: Brain: Cerebral volume within normal limits. Patchy T2/FLAIR hyperintensity involving the supratentorial cerebral white matter, nonspecific, but most commonly related to chronic microvascular ischemic disease, mild in nature. No evidence for acute or subacute ischemia. Gray-white matter differentiation maintained. No areas of chronic cortical infarction. No acute or chronic intracranial blood products. No mass lesion, midline shift or mass effect. No hydrocephalus or extra-axial fluid collection. Empty sella noted. Vascular: Major intracranial vascular flow voids are maintained. Skull and upper cervical spine: Craniocervical junction normal. Bone marrow signal intensity normal. No scalp soft tissue abnormality. Sinuses/Orbits: Globes and orbital soft tissues within normal limits. Paranasal sinuses are largely clear. No significant mastoid effusion. Other: None. IMPRESSION:  1. No acute intracranial abnormality. 2. Patchy T2/FLAIR hyperintensity involving the supratentorial cerebral white matter, nonspecific, but most commonly related to chronic microvascular ischemic disease, mild in nature. 3. Empty sella. Electronically Signed   By: Jeannine Boga M.D.   On: 10/12/2022 23:05   CT ANGIO HEAD NECK W WO CM  Result Date: 10/12/2022 CLINICAL DATA:  Initial evaluation for neuro deficit, stroke suspected. EXAM: CT ANGIOGRAPHY HEAD AND NECK TECHNIQUE: Multidetector CT imaging of the head and neck was performed using the standard protocol during bolus administration of intravenous contrast. Multiplanar CT image reconstructions and MIPs were obtained to evaluate the vascular anatomy. Carotid stenosis measurements (when applicable) are obtained utilizing NASCET criteria, using the distal internal carotid diameter as the denominator. RADIATION DOSE REDUCTION: This exam was performed according to the departmental dose-optimization program which includes automated exposure control, adjustment of the mA and/or kV according to patient size and/or use of iterative reconstruction technique. CONTRAST:  56m OMNIPAQUE IOHEXOL 350 MG/ML SOLN COMPARISON:  Prior CT from earlier the same day. FINDINGS: CT HEAD FINDINGS Brain: Cerebral volume within normal limits. 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 No acute intracranial hemorrhage. No acute large vessel territory infarct. No mass lesion or midline shift. No hydrocephalus or extra-axial fluid collection. Empty sella noted. Vascular: No abnormal hyperdense vessel. Calcified atherosclerosis present at skull base. Skull: Scalp soft tissues and calvarium demonstrate no acute finding. Sinuses/Orbits: Globes orbital soft tissues  within normal limits. Few subcentimeter osteoma is noted at the left ethmoidal air cells. Paranasal sinuses are largely clear. No significant mastoid effusion. Other:  None. Review of the MIP images confirms the above findings CTA NECK FINDINGS Aortic arch: Visualized aortic arch normal caliber with standard 3 vessel morphology. Moderate atheromatous change about the arch and origin of the great vessels without hemodynamically significant stenosis. Right carotid system: Right common and internal carotid arteries patent without dissection or occlusion. Moderate atheromatous change about the right carotid bulb/proximal right ICA with associated stenosis of up to 65% by NASCET criteria. Left carotid system: Left common and internal carotid arteries patent without dissection or occlusion. Moderate atheromatous change about the left carotid bulb/proximal cervical left ICA but without hemodynamically significant greater than 50% stenosis. Vertebral arteries: Both vertebral arteries arise from the subclavian arteries. No proximal subclavian artery stenosis. No dissection or occlusion. Scattered atheromatous change within both vertebral arteries with focal moderate left V3 stenosis (series 11, image 176). No other hemodynamically significant stenosis. Skeleton: No discrete or worrisome osseous lesions. Mild-to-moderate spondylosis present at C4-5 through C6-7. Patient is edentulous. Other neck: No other acute soft tissue abnormality within the neck. Heterogeneous multinodular right lobe of thyroid, indeterminate. Upper chest: Visualized upper chest demonstrates no acute finding. Review of the MIP images confirms the above findings CTA HEAD FINDINGS Anterior circulation: Atheromatous change seen within the carotid siphons with associated mild to moderate multifocal narrowing. A1 segments patent bilaterally. Normal anterior communicating artery complex. Anterior cerebral arteries patent without stenosis. No M1 stenosis or occlusion. No proximal MCA branch occlusion or high-grade stenosis. Distal MCA branches perfused and symmetric. Posterior circulation: Scattered atheromatous change within  the V4 segments bilaterally without significant stenosis. Both PICA patent. Basilar widely patent to its distal aspect without stenosis. Superior cerebellar and posterior cerebral arteries patent bilaterally. Venous sinuses: Patent allowing for timing the contrast bolus. Anatomic variants: None significant.  No aneurysm. Review of the MIP images confirms the above findings IMPRESSION: CT HEAD IMPRESSION: 1. No acute intracranial abnormality. 2. 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 3. Empty sella. CTA HEAD AND NECK: 1. Negative CTA for large vessel occlusion or other emergent finding. 2. Moderate atheromatous change about the right carotid bulb/proximal right ICA with associated stenosis of up to 65% by NASCET criteria. 3. Atheromatous change within the carotid siphons with associated mild to moderate multifocal narrowing. 4. Focal moderate left V3 stenosis. 5. 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 Further evaluation with dedicated thyroid ultrasound recommended. This could be performed on a nonemergent outpatient basis. (Ref: J Am Coll Radiol. 2015 Feb;12(2): 143-50). Aortic Atherosclerosis (ICD10-I70.0). Electronically Signed   By: Jeannine Boga M.D.   On: 10/12/2022 21:53   CT HEAD CODE STROKE WO CONTRAST  Result Date: 10/12/2022 CLINICAL DATA:  Code stroke. Acute neuro deficit. Left-sided weakness EXAM: CT HEAD WITHOUT CONTRAST TECHNIQUE: Contiguous axial images were obtained from the base of the skull through the vertex without intravenous contrast. RADIATION DOSE REDUCTION: This exam was performed according to the departmental dose-optimization program which includes automated exposure control, adjustment of the mA and/or kV according to patient size and/or use of iterative reconstruction technique. COMPARISON:  None Available. FINDINGS: Brain: No evidence of acute infarction, hemorrhage, hydrocephalus,  extra-axial collection or mass lesion/mass effect. Mild white matter hypodensity in the frontal lobes bilaterally. Enlarged sella filled with CSF. Small pituitary. Findings compatible with empty sella. Vascular: Negative for hyperdense vessel Skull: Negative Sinuses/Orbits: Mild  mucosal edema maxillary sinus bilaterally. Small osteoma left ethmoid sinus. Negative orbit Other: None ASPECTS ([place] Stroke Program Early CT Score) - Ganglionic level infarction (caudate, lentiform nuclei, internal capsule, insula, M1-M3 cortex): 7 - Supraganglionic infarction (M4-M6 cortex): 3 Total score (0-10 with 10 being normal): 10 IMPRESSION: 1. Negative for acute infarct. Mild white matter hypodensity in the frontal lobes bilaterally most likely due to chronic microvascular ischemia. 2. Aspects is 10. 3. Code stroke imaging results were communicated on 10/12/2022 at 5:42 pm to provider [place] via secure text page Electronically Signed   By: Franchot Gallo M.D.   On: 10/12/2022 17:42        Scheduled Meds:  vitamin B-12  1,000 mcg Oral Daily   gabapentin  300 mg Oral TID   insulin aspart  0-5 Units Subcutaneous QHS   insulin aspart  0-9 Units Subcutaneous TID WC   irbesartan  300 mg Oral Daily   levothyroxine  150 mcg Oral QAC breakfast   lidocaine  1 patch Transdermal Q24H   nicotine  21 mg Transdermal Daily   rosuvastatin  40 mg Oral Daily   sodium chloride flush  3 mL Intravenous Once   Continuous Infusions:  0.9 % NaCl with KCl 20 mEq / L      ceFAZolin (ANCEF) IV       LOS: 0 days    Time spent: 35 minutes.     Elmarie Shiley, MD [facility]   If 7PM-7AM, please contact night-coverage [email or web address]  10/14/2022, 1:27 PM

## 2022-10-14 NOTE — Progress Notes (Signed)
Physical Therapy Treatment Patient Details Name: Pamela Cherry MRN: [id number] DOB: [birth year] Today's Date: 10/14/2022   History of Present Illness Pt is a 71 y/o female who presented with L sided numbness and weakness. 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 MRI brain negative for acute abnormalities. 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    PT Comments    Pt with good progress towards acute goals with pt endorsing some improvement in symptoms on L and agreeable to OOB mobility. Pt demonstrating ambulation with RW and min guard progressing from step to pattern to step through pattern with increased distance. Pt with no LOB throughout and able to negotiate obstacles in hall without fault. Encouraged pt to continue to ambulate with MT and staff throughout day with pt verbalizing understanding of importance. Current plan remains appropriate to address deficits and maximize functional independence and safety. Pt continues to benefit from skilled PT services to progress toward functional mobility goals.    Recommendations for follow up therapy are one component of a multi-disciplinary discharge planning process, led by the attending physician.  Recommendations may be updated based on patient status, additional functional criteria and insurance authorization.  Follow Up Recommendations  Home health PT     Assistance Recommended at Discharge Intermittent Supervision/Assistance  Patient can return home with the following Assistance with cooking/housework;A little help with bathing/dressing/bathroom;A little help with walking and/or transfers;Help with stairs or ramp for entrance   Equipment Recommendations  None recommended by PT    Recommendations for Other Services       Precautions / Restrictions Precautions Precautions: Fall;Other (comment) Precaution Comments: R LE prosthetic  (present in room) Restrictions Weight Bearing Restrictions: No     Mobility  Bed Mobility Overal bed mobility: Needs Assistance Bed Mobility: Supine to Sit, Sit to Supine     Supine to sit: Min guard Sit to supine: Supervision   General bed mobility comments: increased time and use of bed rail with RUE    Transfers Overall transfer level: Needs assistance Equipment used: Rolling walker (2 wheels) Transfers: Sit to/from Stand Sit to Stand: Min guard           General transfer comment: min guard for safety no assist needed from bed at lowest height    Ambulation/Gait Ambulation/Gait assistance: Min guard Gait Distance (Feet): 75 Feet Assistive device: Rolling walker (2 wheels) Gait Pattern/deviations: Step-to pattern, Step-through pattern, Decreased stride length Gait velocity: decr     General Gait Details: step to progressing to step through gait with RW, no LOB, pt able to navigate obstacles in hall without fault, pt reporting LLE numbness present but improved   Stairs             Wheelchair Mobility    Modified Rankin (Stroke Patients Only)       Balance Overall balance assessment: Needs assistance Sitting-balance support: No upper extremity supported, Feet supported Sitting balance-Leahy Scale: Good     Standing balance support: Single extremity supported, Reliant on assistive device for balance Standing balance-Leahy Scale: Poor Standing balance comment: Steady static stance but requiring UE support                            Cognition Arousal/Alertness: Awake/alert Behavior During Therapy: WFL for tasks assessed/performed Overall Cognitive Status: Within Functional Limits for tasks assessed  General Comments: A&O x4        Exercises      General Comments        Pertinent Vitals/Pain Pain Assessment Pain Assessment: No/denies pain Pain Intervention(s): Monitored during  session    Home Living                          Prior Function            PT Goals (current goals can now be found in the care plan section) Acute Rehab PT Goals PT Goal Formulation: With patient Time For Goal Achievement: 10/27/22    Frequency    Min 4X/week      PT Plan      Co-evaluation              AM-PAC PT "6 Clicks" Mobility   Outcome Measure  Help needed turning from your back to your side while in a flat bed without using bedrails?: None Help needed moving from lying on your back to sitting on the side of a flat bed without using bedrails?: A Little Help needed moving to and from a bed to a chair (including a wheelchair)?: A Little Help needed standing up from a chair using your arms (e.g., wheelchair or bedside chair)?: A Little Help needed to walk in hospital room?: A Little Help needed climbing 3-5 steps with a railing? : A Lot 6 Click Score: 18    End of Session Equipment Utilized During Treatment: Gait belt Activity Tolerance: Patient tolerated treatment well Patient left: in bed;with call bell/phone within reach;with nursing/sitter in room Nurse Communication: Mobility status PT Visit Diagnosis: Other abnormalities of gait and mobility (R26.89);Hemiplegia and hemiparesis Hemiplegia - Right/Left: Left Hemiplegia - dominant/non-dominant: Non-dominant Hemiplegia - caused by: Unspecified     Time: 2707-8675 PT Time Calculation (min) (ACUTE ONLY): 14 min  Charges:  $Gait Training: 8-22 mins                     September Mormile R. PTA Acute Rehabilitation Services Office: [place] 10/14/2022, 9:13 AM

## 2022-10-14 NOTE — Anesthesia Preprocedure Evaluation (Signed)
Anesthesia Evaluation  Patient identified by MRN, date of birth, ID band Patient awake    Reviewed: Allergy & Precautions, NPO status , Patient's Chart, lab work & pertinent test results  Airway Mallampati: II  TM Distance: >3 FB Neck ROM: Full    Dental  (+) Dental Advisory Given   Pulmonary Current Smoker and Patient abstained from smoking.   breath sounds clear to auscultation       Cardiovascular hypertension, Pt. on medications  Rhythm:Regular Rate:Normal     Neuro/Psych  Neuromuscular disease    GI/Hepatic negative GI ROS, Neg liver ROS,,,  Endo/Other  negative endocrine ROSdiabetes, Type 2, Oral Hypoglycemic AgentsHypothyroidism    Renal/GU negative Renal ROS     Musculoskeletal  (+) Arthritis ,    Abdominal   Peds  Hematology  (+) Blood dyscrasia, anemia   Anesthesia Other Findings   Reproductive/Obstetrics                             Anesthesia Physical Anesthesia Plan  ASA: 3  Anesthesia Plan: General   Post-op Pain Management: Gabapentin PO (pre-op)* and Ofirmev IV (intra-op)*   Induction: Intravenous  PONV Risk Score and Plan: 2 and Dexamethasone, Ondansetron and Treatment may vary due to age or medical condition  Airway Management Planned: Oral ETT  Additional Equipment: None  Intra-op Plan:   Post-operative Plan: Extubation in OR  Informed Consent: I have reviewed the patients History and Physical, chart, labs and discussed the procedure including the risks, benefits and alternatives for the proposed anesthesia with the patient or authorized representative who has indicated his/her understanding and acceptance.     Dental advisory given  Plan Discussed with: CRNA  Anesthesia Plan Comments:        Anesthesia Quick Evaluation

## 2022-10-14 NOTE — Anesthesia Procedure Notes (Signed)
Procedure Name: Intubation Date/Time: 10/14/2022 8:06 PM  Performed by: Suzy Bouchard, CRNAPre-anesthesia Checklist: Patient identified, Emergency Drugs available, Suction available, Timeout performed and Patient being monitored Patient Re-evaluated:Patient Re-evaluated prior to induction Oxygen Delivery Method: Circle system utilized Preoxygenation: Pre-oxygenation with 100% oxygen Induction Type: IV induction Ventilation: Mask ventilation without difficulty Laryngoscope Size: Miller and 2 Grade View: Grade I Tube type: Oral Tube size: 7.0 mm Number of attempts: 1 Airway Equipment and Method: Stylet Placement Confirmation: ETT inserted through vocal cords under direct vision, positive ETCO2 and breath sounds checked- equal and bilateral Secured at: 23 cm Tube secured with: Tape Dental Injury: Teeth and Oropharynx as per pre-operative assessment  Comments: Neck neutral for DL/intubation

## 2022-10-14 NOTE — Consult Note (Addendum)
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 microvascular ischemic disease, mild in nature. 3. Empty sella. Electronically Signed   By: Jeannine Boga M.D.   On: 10/12/2022 23:05   CT ANGIO HEAD NECK W WO CM  Result Date: 10/12/2022 CLINICAL DATA:  Initial evaluation for neuro deficit, stroke suspected. EXAM: CT ANGIOGRAPHY HEAD AND NECK TECHNIQUE: Multidetector CT imaging of the head and neck was performed using the standard protocol during bolus administration of intravenous contrast. Multiplanar CT image reconstructions and MIPs were obtained to evaluate the vascular anatomy. Carotid stenosis measurements (when applicable) are obtained utilizing NASCET criteria, using the distal internal carotid diameter as the denominator. RADIATION DOSE REDUCTION: This exam was performed according to the departmental dose-optimization program which includes automated exposure control, adjustment of the mA  and/or kV according to patient size and/or use of iterative reconstruction technique. CONTRAST:  61m OMNIPAQUE IOHEXOL 350 MG/ML SOLN COMPARISON:  Prior CT from earlier the same day. FINDINGS: CT HEAD FINDINGS Brain: Cerebral volume within normal limits. 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 No acute intracranial hemorrhage. No acute large vessel territory infarct. No mass lesion or midline  shift. No hydrocephalus or extra-axial fluid collection. Empty sella noted. Vascular: No abnormal hyperdense vessel. Calcified atherosclerosis present at skull base. Skull: Scalp soft tissues and calvarium demonstrate no acute finding. Sinuses/Orbits: Globes orbital soft tissues within normal limits. Few subcentimeter osteoma is noted at the left ethmoidal air cells. Paranasal sinuses are largely clear. No significant mastoid effusion. Other: None. Review of the MIP images confirms the above findings CTA NECK FINDINGS Aortic arch: Visualized aortic arch normal caliber with standard 3 vessel morphology. Moderate atheromatous change about the arch and origin of the great vessels without hemodynamically significant stenosis. Right carotid system: Right common and internal carotid arteries patent without dissection or occlusion. Moderate atheromatous change about the right carotid bulb/proximal right ICA with associated stenosis of up to 65% by NASCET criteria. Left carotid system: Left common and internal carotid arteries patent without dissection or occlusion. Moderate atheromatous change about the left carotid bulb/proximal cervical left ICA but without hemodynamically significant greater than 50% stenosis. Vertebral arteries: Both vertebral arteries arise from the subclavian arteries. No proximal subclavian artery stenosis. No dissection or occlusion. Scattered atheromatous change within both vertebral arteries with focal moderate left V3 stenosis (series 11, image 176). No  other hemodynamically significant stenosis. Skeleton: No discrete or worrisome osseous lesions. Mild-to-moderate spondylosis present at C4-5 through C6-7. Patient is edentulous. Other neck: No other acute soft tissue abnormality within the neck. Heterogeneous multinodular right lobe of thyroid, indeterminate. Upper chest: Visualized upper chest demonstrates no acute finding. Review of the MIP images confirms the above findings CTA HEAD FINDINGS Anterior circulation: Atheromatous change seen within the carotid siphons with associated mild to moderate multifocal narrowing. A1 segments patent bilaterally. Normal anterior communicating artery complex. Anterior cerebral arteries patent without stenosis. No M1 stenosis or occlusion. No proximal MCA branch occlusion or high-grade stenosis. Distal MCA branches perfused and symmetric. Posterior circulation: Scattered atheromatous change within the V4 segments bilaterally without significant stenosis. Both PICA patent. Basilar widely patent to its distal aspect without stenosis. Superior cerebellar and posterior cerebral arteries patent bilaterally. Venous sinuses: Patent allowing for timing the contrast bolus. Anatomic variants: None significant.  No aneurysm. Review of the MIP images confirms the above findings IMPRESSION: CT HEAD IMPRESSION: 1. No acute intracranial abnormality. 2. 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 3. Empty sella. CTA HEAD AND NECK: 1. Negative CTA for large vessel occlusion or other emergent finding. 2. Moderate atheromatous change about the right carotid bulb/proximal right ICA with associated stenosis of up to 65% by NASCET criteria. 3. Atheromatous change within the carotid siphons with associated mild to moderate multifocal narrowing. 4. Focal moderate left V3 stenosis. 5. 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 Further evaluation with dedicated thyroid  ultrasound recommended. This could be performed on a nonemergent outpatient basis. (Ref: J Am Coll Radiol. 2015 Feb;12(2): 143-50). Aortic Atherosclerosis (ICD10-I70.0). Electronically Signed   By: Jeannine Boga M.D.   On: 10/12/2022 21:53   CT HEAD CODE STROKE WO CONTRAST  Result Date: 10/12/2022 CLINICAL DATA:  Code stroke. Acute neuro deficit. Left-sided weakness EXAM: CT HEAD WITHOUT CONTRAST TECHNIQUE: Contiguous axial images were obtained from the base of the skull through the vertex without intravenous contrast. RADIATION DOSE REDUCTION: This exam was performed according to the departmental dose-optimization program which includes automated exposure control, adjustment of the mA and/or kV according to patient size and/or use of iterative reconstruction technique. COMPARISON:  None Available. FINDINGS: Brain: No evidence of acute infarction, hemorrhage, hydrocephalus, extra-axial  collection or mass lesion/mass effect. Mild white matter hypodensity in the frontal lobes bilaterally. Enlarged sella filled with CSF. Small pituitary. Findings compatible with empty sella. Vascular: Negative for hyperdense vessel Skull: Negative Sinuses/Orbits: Mild mucosal edema maxillary sinus bilaterally. Small osteoma left ethmoid sinus. Negative orbit Other: None ASPECTS ([place]arte Stroke Program Early CT Score) - Ganglionic level infarction (caudate, lentiform nuclei, internal capsule, insula, M1-M3 cortex): 7 - Supraganglionic infarction (M4-M6 cortex): 3 Total score (0-10 with 10 being normal): 10 IMPRESSION: 1. Negative for acute infarct. Mild white matter hypodensity in the frontal lobes bilaterally most likely due to chronic microvascular ischemia. 2. Aspects is 10. 3. Code stroke imaging results were communicated on 10/12/2022 at 5:42 pm to provider [place] via secure text page Electronically Signed   By: Franchot Gallo M.D.   On: 10/12/2022 17:42    ROS: Per HPI Blood pressure (!) 120/53, pulse 74,  temperature 98.2 F (36.8 C), temperature source Oral, resp. rate 18, height '5\' 4"'$  (1.626 m), weight 94.4 kg, SpO2 94 %. General Appearance: Alert, cooperative, no distress, appears stated age Head: Normocephalic, without obvious abnormality, atraumatic Eyes: PERRL, conjunctiva/corneas clear, EOM's intact     Throat: benign Neck: Supple, symmetrical, trachea midline Lungs: respirations unlabored Heart: Regular rate and rhythm Abdomen: Soft Extremities: Extremities normal, atraumatic, no cyanosis or edema Pulses: 2+ and symmetric all extremities Skin: Skin color, texture, turgor normal, no rashes or lesions   NEUROLOGIC:  Mental status: A&O x4, speech appropriate and fluent without evidence of aphasia. Able to follow 3 step commands without difficulty.  No dysarthria present. Good attention span, thought content appropriate. Memory and fund of knowledge appear to be appropriate Motor Exam: grossly normal, normal tone and bulk, no atrophy noted, no ataxia Right: Upper extremity   5/5                                      Left: Upper extremity   5/5, except left deltoid 4/5             Lower extremity   4+/5                                             Lower extremity   4-/5 Sensory Exam: Light touch, temperature/pinprick were assessed and were decreased in LUE and LLE Reflexes: symmetric, no pathologic reflexes, No Hoffman's, No clonus, plantars downgoing bilaterally Gait: deferred Coordination: The patient had normal movements in the hands and feet with no ataxia or dysmetria. Tremor was absent. normal finger-to-nose, normal rapid alternating movements and normal heel-to-shin test.   Cranial Nerves: I: Smell Not tested  II: Visual acuity  OS: na    OD: na Visual fields grossly normal  II: Visual fields Full to confrontation  II: Pupils Equal, round, reactive to light  III,VII: Ptosis None  III,IV,VI: Extraocular muscles  Extraocular movements intact  V: Mastication Normal  V: Facial  light touch sensation  Decreased sensation on left  V,VII: Corneal reflex  Present  VII: Facial muscle function - upper  Facial movement intact bilaterally  VII: Facial muscle function - lower Facial movement intact bilaterally / Left lower facial droop  VIII: Hearing Not tested / Hearing & vestibular intact bilaterally / Hearing normal bilaterally  IX: Soft palate elevation  Normal, uvula rises  symmetrically  IX,X: Gag reflex Present  XI: Trapezius strength  5/5   XI: Sternocleidomastoid strength 5/5  XI: Neck flexion strength  5/5 Chin turning & shoulder shrug intact bilaterally  XII: Tongue strength  Tongue protrusion intact and in midline      Assessment/Plan: 71 y.o. female with 1-2 week history of progressively worsening LUE and LLE paraesthesia and weakness. Neurology workup for acute stroke was negative. CT cervical spine revealed multifactorial severe spinal stenosis at the C4/5 level with cord compression and cord signal change with severe bilateral foraminal stenosis. A/P diameter of spinal canal at C4/5 measures 3-4 mm. C5/6 with multifactorial moderate spinal stenosis with flattening of the cord. Due to the severe spinal stenosis with cord compression, I have recommended the patient undergo surgical intervention to decompress the neural elements. This will likely consist of a C4/5 and C5/6 ACDF.  -NPO -OR today with Dr. Marcello Moores for ACDF   Marvis Moeller, DNP, AGNP-C Neurosurgery Nurse Practitioner  [facility] [place]. [address] [place], [place], [place] [address] P: [phone]    F: [phone]  10/14/2022 10:39 AM  Patient seen and examined.  She has 4-/5 left hand grip, B, T, and 4-/5 left LE, 4+ R LE.  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  Given her rapidly progressive symptoms over the past week and ongoing weakness, I have recommended C4-5, C5-6 ACDF.  General  technique of surgery, as well as risks, benefits, alternatives, and expected convalescence were discussed.  Risk discussed included, but were not limited to, bleeding, pain, infection, scar, pseudoarthrosis, adjacent segment disease, dysphagia, dysphonia, neurologic deficit, spinal fluid leak, and death.  Informed consent was obtained.  She wished to proceed with surgery.  We we will schedule surgery for today.

## 2022-10-14 NOTE — Progress Notes (Addendum)
Pt received in Pre-Op for surgery. AAOx4. No report received from 3W floor nurse prior to pt's arrival to the unit.

## 2022-10-14 NOTE — Progress Notes (Signed)
SLP Cancellation Note  Patient Details Name: Pamela Cherry MRN: [id number] DOB: 09-27-1951   Cancelled treatment:       Reason Eval/Treat Not Completed: SLP screened, no needs identified, will sign off Pt did not present with any acute speech, language, or cognitive-linguistic deficits on admission, MRI was negative for acute changes, and no overt communication or cognitive-linguistic deficits were noted by physical therapy/occupational therapy. A formal evaluation does not appear to be clinically indicated at this time. SLP will sign off.  Timotheus Salm I. Hardin Negus, [place], [place] Office number [phone]  Horton Marshall 10/14/2022, 8:20 AM
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
## 2022-10-14 NOTE — Op Note (Signed)
PREOP DIAGNOSIS: Cervical spondylitic myelopathy  POSTOP DIAGNOSIS: Cervical spondylitic myelopathy   PROCEDURE: 1. Arthrodesis C4-5, anterior interbody technique, including Discectomy for decompression of spinal cord and exiting nerve roots with foraminotomies  2. Arthrodesis, additional level C5-6 anterior interbody technique, including Discectomy for decompression of spinal cord and exiting nerve roots with foraminotomies  3. Placement of intervertebral biomechanical device C4-5 4. Placement of intervertebral biomechanical device C5-6 5. Placement of anterior instrumentation consisting of interbody plate and screws S8-5-4 6. Use of morselized bone allograft  7. Use of intraoperative microscope 8.  -22 modifier for unusually difficult surgery requiring significantly greater effort given previous thyroidectomy and body habitus  SURGEON: Dr. Duffy Rhody, MD  ASSISTANT: Weston Brass, NP.  Please note, no qualified trainees were available to assist with the procedure.  Assistance was required for retraction of the visceral structures to safely allow for instrumentation.  ANESTHESIA: General Endotracheal  EBL: 25 ml  IMPLANTS: Medtronic Titan C cage C4-5: 7 x 16 x 14 mm C5-6: 7 x 14 x 12 mm 37 mm Zevo plate 16 mm screws x 6  SPECIMENS: None  DRAINS: None  COMPLICATIONS: None immediate  CONDITION: Hemodynamically stable to PACU  HISTORY: This is a 71 yo F with DM, previous thyroidectomy who developed acute left sided weakness over the past week.  Imaging did not reveal CVA but showed severe cervical cord impingement with myelomalacia and cord signal change at C4-5 due to large disc-osteophyte as well as moderate-to-severe stenosis at C5-6 with mild cord impingement.  Risks, benefits, alternatives, and expected convalescence were discussed with the patient.  Risks discussed included but were not limited to bleeding, pain, infection, dysphagia, dysphonia, pseudoarthrosis,  hardware failure, adjacent segment disease, CSF leak, neurologic deficits, weakness, numbness, paralysis, coma, and death. After all questions were answered, informed consent was obtained.  PROCEDURE IN DETAIL: The patient was brought to the operating room and transferred to the operative table. After induction of general anesthesia, the patient was positioned on the operative table in the supine position with all pressure points meticulously padded. The skin of the neck was then prepped and draped in the usual sterile fashion.  After timeout was conducted, the skin was infiltrated with local anesthetic. Using C-arm, incision was planned between C4-5 and C5-6 disc spaced.  Due to the patient's body habitus and short neck, this was close to her previous thyroidectomy scar and the manubrium/clavicle.  Skin incision was then made sharply and Bovie electrocautery was used to dissect the subcutaneous tissue until the platysma was identified. The platysma was then divided and undermined. The investing cervical fascia was densely scarred, and sharp dissection was required. Sternocleidomastoid muscle and carotid sheath was then identified and using sharp dissection, the thickened  prevertebral fascia was opened sharply and bilateral longus colli was identified.   iThe carotid sheath was retracted laterally and the trachea and esophagus retracted medially. Again using fluoroscopy, the C4-5 disc space was identified. Bovie electrocautery was used to dissect in the subperiosteal plane and elevate the bilateral longus coli muscles. Self-retaining retractors were then placed. Caspar distraction pins were placed in the adjacent bodies to allow for gentle distraction.  At this point, the microscope was draped and brought into the field, and the remainder of the case was done under the microscope using microdissecting technique.  The C4-5 disc space was incised sharply and combination of high speed drill, curettes, and  rongeurs were use to initially complete a discectomy. The high-speed drill was then used to complete  discectomy until the posterior annulus was identified and removed and the posterior longitudinal ligament was identified. Using a nerve hook, the PLL was elevated, and Kerrison rongeurs were used to remove the posterior longitudinal ligament.  The thecal sac was seen and large left paracentral subligamentous partially calcified disc was removed. Using a combination of curettes and rongeurs, complete decompression of the thecal sac and exiting nerve roots at this level was completed, and verified with easy passage of micro-nerve hook centrally and in the bilateral foramina.  Having completed our decompression, attention was turned to placement of the intervertebral device. Trial spacers were used to select a size 7 mm graft. This graft was then filled with morcellized allograft, and inserted under live fluoroscopy.  Attention was then turned to the C5-6 level. Caspar distraction pin was placed in the adjacent body to allow for gentle distraction of the disc space.  In a similar fashion, discectomy was completed initially with curettes and rongeurs, and completed with the drill. The disc and annulus was heavily calcified, which contributed to increased difficulty of the procedure, as did the patient's short neck which limited parallel visibility in the C5-6 disc space. The PLL was again identified, elevated and incised. Using Kerrison rongeurs, decompression of the spinal cord and exiting roots was completed and confirmed with a dissector. Trial spacers were used to select a 7 mm graft. This graft was then filled with morcellized allograft, and inserted under live fluoroscopy.  After placement of the intervertebral devices, the caspar pins were removed.  An anterior cervical plate was placed across the interspaces for anterior fixation.  Using a high-speed drill, the cortex of the cervical vertebral bodies was  punctured, and screws inserted in the vertebral bodies. Final fluoroscopic images in AP and lateral projections were taken to confirm good hardware placement.  At this point, after all counts were verified to be correct, meticulous hemostasis was secured using a combination of bipolar electrocautery and passive hemostatics. Medium hemovac drain was placed in the deep cervical space and tunneled out the skin and secured with a stitch. The platysma muscle was then closed using interrupted 3-0 Vicryl sutures, and the skin was closed with a 4-0 monocryl in subcuticular fashion, followed by Benzoin and steri strips. Sterile dressings were then applied and the drapes removed.  The patient tolerated the procedure well and was extubated in the room and taken to the postanesthesia care unit in stable condition.  All counts were correct at the end of the procedure.

## 2022-10-14 NOTE — Transfer of Care (Signed)
Immediate Anesthesia Transfer of Care Note  Patient: Pamela Cherry  Procedure(s) Performed: CERVICAL FOUR-FIVE, CERVICAL FIVE-SIX ANTERIOR CERVICAL DISCECTOMY FUSION (Neck)  Patient Location: PACU  Anesthesia Type:General  Level of Consciousness: awake, alert , and oriented  Airway & Oxygen Therapy: Patient Spontanous Breathing and Patient connected to nasal cannula oxygen  Post-op Assessment: Report given to RN, Post -op Vital signs reviewed and stable, and Patient moving all extremities X 4  Post vital signs: Reviewed and stable  Last Vitals:  Vitals Value Taken Time  BP 154/81 10/14/22 2320  Temp    Pulse 92 10/14/22 2324  Resp 20 10/14/22 2324  SpO2 93 % 10/14/22 2324  Vitals shown include unvalidated device data.  Last Pain:  Vitals:   10/14/22 1836  TempSrc:   PainSc: 0-No pain         Complications: No notable events documented.

## 2022-10-14 NOTE — Progress Notes (Signed)
Mobility Specialist: Progress Note   10/14/22 1235  Mobility  Activity Ambulated with assistance in hallway  Level of Assistance Standby assist, set-up cues, supervision of patient - no hands on  Assistive Device Front wheel walker  Distance Ambulated (ft) 120 ft  Activity Response Tolerated well  Mobility Referral Yes  $Mobility charge 1 Mobility   Received pt in bed having no complaints and agreeable to mobility. Pt able to don RLE prosthetic independently. Pt was asymptomatic throughout ambulation and returned to room w/o fault. Left in bed w/ call bell in reach and all needs met.  Christopher Day Mobility Specialist Secure Chat Only  

## 2022-10-15 ENCOUNTER — Encounter (HOSPITAL_COMMUNITY): Payer: Self-pay | Admitting: Neurosurgery

## 2022-10-15 DIAGNOSIS — G9520 Unspecified cord compression: Secondary | ICD-10-CM | POA: Diagnosis not present

## 2022-10-15 DIAGNOSIS — R200 Anesthesia of skin: Secondary | ICD-10-CM | POA: Diagnosis not present

## 2022-10-15 LAB — CBC
HCT: 35.5 % — ABNORMAL LOW (ref 36.0–46.0)
Hemoglobin: 10.4 g/dL — ABNORMAL LOW (ref 12.0–15.0)
MCH: 23.5 pg — ABNORMAL LOW (ref 26.0–34.0)
MCHC: 29.3 g/dL — ABNORMAL LOW (ref 30.0–36.0)
MCV: 80.3 fL (ref 80.0–100.0)
Platelets: 258 10*3/uL (ref 150–400)
RBC: 4.42 MIL/uL (ref 3.87–5.11)
RDW: 18 % — ABNORMAL HIGH (ref 11.5–15.5)
WBC: 9.2 10*3/uL (ref 4.0–10.5)
nRBC: 0 % (ref 0.0–0.2)

## 2022-10-15 LAB — GLUCOSE, CAPILLARY
Glucose-Capillary: 184 mg/dL — ABNORMAL HIGH (ref 70–99)
Glucose-Capillary: 242 mg/dL — ABNORMAL HIGH (ref 70–99)
Glucose-Capillary: 156 mg/dL — ABNORMAL HIGH (ref 70–99)
Glucose-Capillary: 217 mg/dL — ABNORMAL HIGH (ref 70–99)

## 2022-10-15 LAB — BASIC METABOLIC PANEL
Anion gap: 8 (ref 5–15)
BUN: 11 mg/dL (ref 8–23)
CO2: 23 mmol/L (ref 22–32)
Calcium: 8.6 mg/dL — ABNORMAL LOW (ref 8.9–10.3)
Chloride: 107 mmol/L (ref 98–111)
Creatinine, Ser: 0.66 mg/dL (ref 0.44–1.00)
GFR, Estimated: >60 mL/min (ref >60)
Glucose, Bld: 186 mg/dL — ABNORMAL HIGH (ref 70–99)
Potassium: 4.6 mmol/L (ref 3.5–5.1)
Sodium: 138 mmol/L (ref 135–145)

## 2022-10-15 MED ORDER — OXYCODONE HCL 5 MG PO TABS
5.0000 mg | ORAL_TABLET | ORAL | Status: DC | PRN
  Administered 2022-10-15 – 2022-10-18 (×3): 5 mg via ORAL
  Filled 2022-10-15 (×3): qty 1

## 2022-10-15 MED ORDER — POLYETHYLENE GLYCOL 3350 17 G PO PACK: 17.0000 g | PACK | Freq: Every day | ORAL | Status: DC | PRN

## 2022-10-15 MED ORDER — ENOXAPARIN SODIUM 40 MG/0.4ML IJ SOSY
40.0000 mg | PREFILLED_SYRINGE | INTRAMUSCULAR | Status: DC
  Administered 2022-10-16 – 2022-10-19 (×4): 40 mg via SUBCUTANEOUS
  Filled 2022-10-15 (×4): qty 0.4

## 2022-10-15 MED ORDER — SODIUM CHLORIDE 0.9% FLUSH
3.0000 mL | Freq: Two times a day (BID) | INTRAVENOUS | Status: DC
  Administered 2022-10-15 – 2022-10-19 (×9): 3 mL via INTRAVENOUS

## 2022-10-15 MED ORDER — LACTATED RINGERS IV SOLN: INTRAVENOUS | Status: DC

## 2022-10-15 MED ORDER — ASPIRIN 325 MG PO TABS
325.0000 mg | ORAL_TABLET | Freq: Every day | ORAL | Status: DC
  Administered 2022-10-17 – 2022-10-19 (×3): 325 mg via ORAL
  Filled 2022-10-15 (×3): qty 1

## 2022-10-15 MED ORDER — OXYCODONE HCL 5 MG PO TABS
10.0000 mg | ORAL_TABLET | ORAL | Status: DC | PRN
  Administered 2022-10-15 – 2022-10-17 (×5): 10 mg via ORAL
  Filled 2022-10-15 (×5): qty 2

## 2022-10-15 MED ORDER — CEFAZOLIN SODIUM-DEXTROSE 1-4 GM/50ML-% IV SOLN
1.0000 g | Freq: Three times a day (TID) | INTRAVENOUS | Status: AC
  Administered 2022-10-15 (×3): 1 g via INTRAVENOUS
  Filled 2022-10-15 (×3): qty 50

## 2022-10-15 MED ORDER — HYDROMORPHONE HCL 1 MG/ML IJ SOLN
0.5000 mg | INTRAMUSCULAR | Status: DC | PRN
  Administered 2022-10-15 – 2022-10-16 (×2): 0.5 mg via INTRAVENOUS
  Filled 2022-10-15 (×2): qty 0.5

## 2022-10-15 MED ORDER — ONDANSETRON HCL 4 MG PO TABS: 4.0000 mg | ORAL_TABLET | Freq: Four times a day (QID) | ORAL | Status: DC | PRN

## 2022-10-15 MED ORDER — CHLORHEXIDINE GLUCONATE 0.12 % MT SOLN
15.0000 mL | Freq: Once | OROMUCOSAL | Status: AC
  Administered 2022-10-15: 15 mL via OROMUCOSAL
  Filled 2022-10-15: qty 15

## 2022-10-15 MED ORDER — PHENOL 1.4 % MT LIQD: 1.0000 | OROMUCOSAL | Status: DC | PRN

## 2022-10-15 MED ORDER — MENTHOL 3 MG MT LOZG: 1.0000 | LOZENGE | OROMUCOSAL | Status: DC | PRN

## 2022-10-15 MED ORDER — SODIUM CHLORIDE 0.9% FLUSH: 3.0000 mL | INTRAVENOUS | Status: DC | PRN

## 2022-10-15 MED ORDER — POTASSIUM CHLORIDE IN NACL 20-0.9 MEQ/L-% IV SOLN
INTRAVENOUS | Status: DC
  Filled 2022-10-15: qty 1000

## 2022-10-15 MED ORDER — SODIUM CHLORIDE 0.9 % IV SOLN: INTRAVENOUS | Status: DC

## 2022-10-15 MED ORDER — FLEET ENEMA 7-19 GM/118ML RE ENEM: 1.0000 | ENEMA | Freq: Once | RECTAL | Status: DC | PRN

## 2022-10-15 MED ORDER — ORAL CARE MOUTH RINSE: 15.0000 mL | Freq: Once | OROMUCOSAL | Status: AC

## 2022-10-15 MED ORDER — SODIUM CHLORIDE 0.9 % IV SOLN: 250.0000 mL | INTRAVENOUS | Status: DC

## 2022-10-15 MED ORDER — ONDANSETRON HCL 4 MG/2ML IJ SOLN
4.0000 mg | Freq: Four times a day (QID) | INTRAMUSCULAR | Status: DC | PRN
  Administered 2022-10-19: 4 mg via INTRAVENOUS
  Filled 2022-10-15: qty 2

## 2022-10-15 MED ORDER — DOCUSATE SODIUM 100 MG PO CAPS
100.0000 mg | ORAL_CAPSULE | Freq: Two times a day (BID) | ORAL | Status: DC
  Administered 2022-10-15 – 2022-10-19 (×10): 100 mg via ORAL
  Filled 2022-10-15 (×10): qty 1

## 2022-10-15 MED FILL — Thrombin For Soln 5000 Unit: CUTANEOUS | Qty: 2 | Status: AC

## 2022-10-15 NOTE — TOC Progression Note (Signed)
Transition of Care [facility]) - Progression Note    Patient Details  Name: Pamela Cherry MRN: [id number] Date of Birth: 16-Sep-1951  [facility] CM/SW Contact  Pollie Friar, RN Phone Number: 10/15/2022, 1:05 PM  Clinical Narrative:    SDOH Interventions Today    Flowsheet Row Most Recent Value  SDOH Interventions   Food Insecurity Interventions [id number] Referral, Inpatient TOC  Utilities Interventions [id number] Referral, Inpatient [facility]        Expected Discharge Plan: [place] Barriers to Discharge: Continued Medical Work up  Expected Discharge Plan and Services Expected Discharge Plan: [place]   Discharge Planning Services: CM Consult Post Acute Care Choice: [place] arrangements for the past 2 months: Single Family Home                           HH Arranged: PT, OT [facility] Agency: [place] Date [place]: 10/13/22   Representative spoke with at [place]: [place] Determinants of Health ([place]) Interventions Food Insecurity Interventions: [id number] Referral, Inpatient TOC Utilities Interventions: [id number] Referral, Inpatient TOC  Readmission Risk Interventions     No data to display

## 2022-10-15 NOTE — Progress Notes (Signed)
Physical Therapy Re-Eval/Treatment Patient Details Name: Pamela Cherry MRN: [id number] DOB: [birth year] Today's Date: 10/15/2022   History of Present Illness Pt is a 71 y/o female who presented with L sided numbness and weakness. MRI brain negative for acute infarct though MRI cervical spine showed severe spinal stenosis with cord compression and signal change. Pt underwent ACDF of C4-5, C5-5 on 11/2. 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    PT Comments    Patient now s/p C4-6 ACDF and continues to have Lt UE/LE weakness and impaired sensation. Pt overall mobilizing well with RW for gait and transfers. Reviewed cervical precautions and use of soft collar for comfort and protection during mobility. Pt has difficulty with Lt LE step placement and tendency to land on lateral aspect of foot then roll into pronation putting her at risk for a rolled ankle. Recommend ankle ASO for stability with ambulation. Will continue to progress pt as able with plan for HHPT when medically ready to discharge.   Recommendations for follow up therapy are one component of a multi-disciplinary discharge planning process, led by the attending physician.  Recommendations may be updated based on patient status, additional functional criteria and insurance authorization.  Follow Up Recommendations  Home health PT     Assistance Recommended at Discharge Intermittent Supervision/Assistance  Patient can return home with the following Assistance with cooking/housework;A little help with bathing/dressing/bathroom;A little help with walking and/or transfers;Help with stairs or ramp for entrance;Assist for transportation   Equipment Recommendations  None recommended by PT    Recommendations for Other Services       Precautions / Restrictions Precautions Precautions: Fall;Other (comment);Cervical Precaution Booklet Issued: Yes (comment) Precaution Comments:  R LE prosthetic (present in room) Required Braces or Orthoses: Cervical Brace Cervical Brace: Soft collar;For comfort Restrictions Weight Bearing Restrictions: No     Mobility  Bed Mobility Overal bed mobility: Needs Assistance Bed Mobility: Supine to Sit     Supine to sit: Supervision, HOB elevated     General bed mobility comments: HOB elevated and pt able to move to long sit in bed then scoot LE's off EOB with supervision only and extra time.    Transfers Overall transfer level: Needs assistance Equipment used: Rolling walker (2 wheels) Transfers: Sit to/from Stand Sit to Stand: Min guard           General transfer comment: min guard for safety. pt using bil UE's to power up and able to steady self with RW.    Ambulation/Gait Ambulation/Gait assistance: Min guard, Min assist Gait Distance (Feet): 180 Feet Assistive device: Rolling walker (2 wheels) Gait Pattern/deviations: Step-to pattern, Step-through pattern, Decreased stride length, Decreased dorsiflexion - left (Lt ankle instability (supinating with quick drop to pronating from start to end of stance phase)) Gait velocity: decr     General Gait Details: pt required cues at start to maintain safe distance to RW, improved throughout. no bucklign of LE's or LOB noted. Pt's Lt LE adducting slightly due to impaired proprioception. Pt's Lt ankle also in supinated position with initial strike during stance phase occuring on lateral foot and pt dropping into pronation during stance.   Stairs             Wheelchair Mobility    Modified Rankin (Stroke Patients Only)       Balance Overall balance assessment: Needs assistance Sitting-balance support: No upper extremity supported, Feet supported Sitting balance-Leahy Scale: Good Sitting balance - Comments:  able to don Rt prosthesis in sitting   Standing balance support: Bilateral upper extremity supported, Reliant on assistive device for balance, During  functional activity Standing balance-Leahy Scale: Poor                              Cognition Arousal/Alertness: Awake/alert Behavior During Therapy: WFL for tasks assessed/performed Overall Cognitive Status: Within Functional Limits for tasks assessed                                 General Comments: pleasant and eager to mobilize        Exercises      General Comments        Pertinent Vitals/Pain Pain Assessment Pain Assessment: Faces Faces Pain Scale: Hurts a little bit Pain Location: neck Pain Descriptors / Indicators: Discomfort, Sore Pain Intervention(s): Limited activity within patient's tolerance, Monitored during session, Repositioned, Premedicated before session    Home Living                          Prior Function            PT Goals (current goals can now be found in the care plan section) Acute Rehab PT Goals Patient Stated Goal: return home PT Goal Formulation: With patient Time For Goal Achievement: 10/27/22 Potential to Achieve Goals: Good Progress towards PT goals: Progressing toward goals    Frequency    Min 5X/week      PT Plan Current plan remains appropriate    Co-evaluation              AM-PAC PT "6 Clicks" Mobility   Outcome Measure  Help needed turning from your back to your side while in a flat bed without using bedrails?: None Help needed moving from lying on your back to sitting on the side of a flat bed without using bedrails?: A Little Help needed moving to and from a bed to a chair (including a wheelchair)?: A Little Help needed standing up from a chair using your arms (e.g., wheelchair or bedside chair)?: A Little Help needed to walk in hospital room?: A Little Help needed climbing 3-5 steps with a railing? : A Lot 6 Click Score: 18    End of Session Equipment Utilized During Treatment: Gait belt;Cervical collar Activity Tolerance: Patient tolerated treatment well Patient  left: in chair;with call bell/phone within reach;with chair alarm set Nurse Communication: Mobility status PT Visit Diagnosis: Other abnormalities of gait and mobility (R26.89);Hemiplegia and hemiparesis Hemiplegia - Right/Left: Left Hemiplegia - dominant/non-dominant: Non-dominant Hemiplegia - caused by: Unspecified (cervical myelopathy)     Time: 2956-2130 PT Time Calculation (min) (ACUTE ONLY): 15 min   Charges:   PT Evaluation $PT Re-evaluation: 1 Re-eval     Verner Mould, DPT Acute Rehabilitation Services Office [phone]  10/15/22 12:37 PM

## 2022-10-15 NOTE — Progress Notes (Signed)
Orthopedic Tech Progress Note Patient Details:  Pamela Cherry February 17, 1951 [id number]  Ortho Devices Type of Ortho Device: Soft collar Ortho Device/Splint Location: NECK Ortho Device/Splint Interventions: Ordered  Handed soft collar to nurse in pt rm.    [name] [name] 10/15/2022, 12:50 AM

## 2022-10-15 NOTE — Progress Notes (Signed)
Orthopedic Tech Progress Note Patient Details:  Pamela Cherry 10-15-1951 [id number]  Ortho Devices Type of Ortho Device: ASO Ortho Device/Splint Location: LLE Ortho Device/Splint Interventions: Ordered, Application, Adjustment   Post Interventions Patient Tolerated: Well Instructions Provided: Care of device, Adjustment of device  [place] [facility] 10/15/2022, 2:34 PM

## 2022-10-15 NOTE — Progress Notes (Signed)
Mobility Specialist: Progress Note   10/15/22 1635  Mobility  Activity Ambulated with assistance in hallway  Level of Assistance Standby assist, set-up cues, supervision of patient - no hands on  Assistive Device Front wheel walker  Distance Ambulated (ft) 200 ft  Activity Response Tolerated well  Mobility Referral Yes  $Mobility charge 1 Mobility   Pt received in the bed and agreeable to mobility. Independent with donning RLE prosthetic. Pt states her L foot felt much better with ASO brace. No c/o throughout. Pt back to bed after session with call bell at her side.   [place] Pamela Cherry Mobility Specialist Secure Chat Only

## 2022-10-15 NOTE — Anesthesia Postprocedure Evaluation (Signed)
Anesthesia Post Note  Patient: Pamela Cherry  Procedure(s) Performed: CERVICAL FOUR-FIVE, CERVICAL FIVE-SIX ANTERIOR CERVICAL DISCECTOMY FUSION (Neck)     Patient location during evaluation: PACU Anesthesia Type: General Level of consciousness: awake and alert Pain management: pain level controlled Vital Signs Assessment: post-procedure vital signs reviewed and stable Respiratory status: spontaneous breathing, nonlabored ventilation, respiratory function stable and patient connected to nasal cannula oxygen Cardiovascular status: blood pressure returned to baseline and stable Postop Assessment: no apparent nausea or vomiting Anesthetic complications: no  No notable events documented.  Last Vitals:  Vitals:   10/15/22 0127 10/15/22 0347  BP: 133/65 137/65  Pulse: 89 89  Resp: 16 16  Temp:  36.8 C  SpO2: 97% 97%    Last Pain:  Vitals:   10/15/22 0538  TempSrc:   PainSc: Tyler Deis

## 2022-10-15 NOTE — Progress Notes (Signed)
Neurosurgery  S: no complaints  O:  A+Ox3, breathing comfortably  4/5 grip, biceps, triceps on left, 4/5 LLE. R bka with full strength proximally  S/p C4-6 ACDF - dc drain this afternoon -PT/OT - resume aspirin 11/5 - ok for pharmacological dvt ppx 11/4

## 2022-10-15 NOTE — Progress Notes (Addendum)
PROGRESS NOTE    Pamela Cherry  HUO:[id number] DOB: September 24, 1951 DOA: 10/12/2022 PCP: Susy Frizzle, MD   Brief Narrative: 71 year old with past medical history significant for diabetes type 2, hypertension, hyperlipidemia, hypothyroidism, status post right BKA, tobacco use, chronic numbness of the left fourth and fifth finger who presented to the ED for evaluation of new numbness and weakness on her left upper and lower extremities.  Over the last 1 or 2 weeks he has noted intermittent numbness involving the whole left hand.  The day of admission around 1530 she developed new numbness involving her left arm.  Subsequently developed left leg and left face numbness.  No associated focal weakness, change in speech or change in vision. CT head without contrast was negative for acute infarct.  Mild white matter hypodensity in the frontal lobe bilaterally seen most likely due to chronic microvascular ischemia.  Patient has been admitted for further CVA work-up.  Assessment & Plan:   Principal Problem:   Left sided numbness Active Problems:   Hypertension associated with diabetes ([place])   Type 2 diabetes mellitus without complication (HCC)   Acquired hypothyroidism   Hyperlipidemia associated with type 2 diabetes mellitus (HCC)   Tobacco use  1-Left side  numbness: left arm and leg. Secondary to Cervical Spondylitic Myelopathy.  -MRI: No acute intracranial abnormality. Empty sella. -B 12: 183. Started  supplement.  -CTA: Negative for CTA large vessel occlusion.  Moderate atheromatous changes in the right carotid bulb/proximal right ICA with associated stenosis up to 65%.--Neurology recommendation -Cervical MRI showed:  Left paracentral disc osteophyte complex at C4-5 with resultant severe spinal stenosis, with severe bilateral C5 foraminal narrowing. Associated cord flattening with chronic myelomalacia involving the left greater than right cord at this level.. Right paracentral disc  osteophyte complex at C5-6 with resultant moderate spinal stenosis. Left-sided uncovertebral spurring with resultant moderate left C6 foraminal stenosis. Small left paracentral disc protrusion at C2-3 with resultant mild spinal stenosis. Small central disc protrusion at C3-4 with resultant mild spinal stenosis. -Neurosurgery consulted, underwent  C4-C5, C5-C6 Placement of intervertebral biomechanical device by Dr Marcello Moores.  Doing well post sx. Per neuro sx ok to resume aspirin on 11/05 and DVT prophylaxis 11/04  2-Hypertension associated with diabetes Continue with Olmersartan.   3-Diabetes type 2: SSI.  Hold metformin and glipizide while inpatient  4-Tobacco  use: Nicotine patch Counseling.   5-Hyperlipidemia Continue with Crestor  6-Acquired hypothyroidism TSH at  10, will increase Synthroid to 150 mcg  Right shoulder pain; X ray negative.  MRI: Mild to moderate mid anterior infraspinatus tendinosis with focal partial thickness tear of the measure only 4 mm in AP dimension but involve the majority of the transverse dimension of the tendon and extending through the bursal tendon surface.  Longitudinal instrumentation tear with the deep aspect of the mid AP dimension of the infraspinatus muscle measuring up to 15 mm in craniocaudal dimension.  Long head of the biceps tendinosis mild to moderate, mild degenerative changes of the acromion clavicular joint. She is not having significant pain today, and is able to raise left arm  Discussed with Ortho, plan for OT, pain management, likely frozen shoulder.   Estimated body mass index is 35.72 kg/m as calculated from the following:   Height as of this encounter: '5\' 4"'$  (1.626 m).   Weight as of this encounter: 94.4 kg.   DVT prophylaxis: Lovenox Code Status: Full code Family Communication: care discussed with patient.  Disposition Plan:  Status is: Observation The  patient remains OBS appropriate and will d/c before 2  midnights.    Consultants:  Neurology   Procedures:  ECHO   Antimicrobials:    Subjective: She is sitting recliner, feeling ok post sx. Denies significant pain. Report numbness has been stable.   Objective: Vitals:   10/15/22 0127 10/15/22 0347 10/15/22 0826 10/15/22 1200  BP: 133/65 137/65 126/67 (!) 123/56  Pulse: 89 89 87 68  Resp: '16 16 18 18  '$ Temp:  98.3 F (36.8 C) 98.4 F (36.9 C) 98.5 F (36.9 C)  TempSrc:  Oral Oral   SpO2: 97% 97% 100% 96%  Weight:      Height:        Intake/Output Summary (Last 24 hours) at 10/15/2022 1451 Last data filed at 10/15/2022 0500 Gross per 24 hour  Intake 1702.41 ml  Output 25 ml  Net 1677.41 ml    Filed Weights   10/12/22 1700 10/12/22 1757  Weight: 94.4 kg 94.4 kg    Examination:  General exam: NAD Respiratory system: CTA Cardiovascular system: S 1, S 2 RRR Gastrointestinal system: BS present, soft, nt Central nervous system: Alert, follows command Extremities: no edema   Data Reviewed: I have personally reviewed following labs and imaging studies  CBC: Recent Labs  Lab 10/12/22 1727 10/12/22 1732 10/13/22 0307 10/15/22 0912  WBC 7.8  --  5.6 9.2  NEUTROABS 5.7  --   --   --   HGB 11.3* 12.6 10.0* 10.4*  HCT 36.8 37.0 32.8* 35.5*  MCV 78.5*  --  79.2* 80.3  PLT 236  --  228 329    Basic Metabolic Panel: Recent Labs  Lab 10/12/22 1727 10/12/22 1732 10/13/22 0307 10/15/22 0912  NA 141 138 139 138  K 4.3 4.2 3.6 4.6  CL 104 103 105 107  CO2 24  --  23 23  GLUCOSE 198* 194* 131* 186*  BUN '12 13 12 11  '$ CREATININE 0.78 0.60 0.68 0.66  CALCIUM 9.4  --  8.9 8.6*    GFR: Estimated Creatinine Clearance: 70.8 mL/min (by C-G formula based on SCr of 0.66 mg/dL). Liver Function Tests: Recent Labs  Lab 10/12/22 1727  AST 30  ALT 13  ALKPHOS 84  BILITOT 0.7  PROT 6.6  ALBUMIN 3.5    No results for input(s): "LIPASE", "AMYLASE" in the last 168 hours. No results for input(s): "AMMONIA" in the  last 168 hours. Coagulation Profile: Recent Labs  Lab 10/12/22 1727  INR 1.1    Cardiac Enzymes: No results for input(s): "CKTOTAL", "CKMB", "CKMBINDEX", "TROPONINI" in the last 168 hours. BNP (last 3 results) No results for input(s): "PROBNP" in the last 8760 hours. HbA1C: Recent Labs    10/13/22 0307  HGBA1C 6.3*    CBG: Recent Labs  Lab 10/14/22 1830 10/14/22 1940 10/14/22 2135 10/14/22 2321 10/15/22 0831  GLUCAP 81 83 112* 151* 184*    Lipid Profile: Recent Labs    10/13/22 0307 10/13/22 0849  CHOL 167  --   HDL 27*  --   LDLCALC NOT CALCULATED  --   TRIG 194*  --   CHOLHDL 6.2  --   LDLDIRECT  --  104*    Thyroid Function Tests: Recent Labs    10/13/22 0849  TSH 10.183*    Anemia Panel: Recent Labs    10/13/22 0849  VITAMINB12 183    Sepsis Labs: No results for input(s): "PROCALCITON", "LATICACIDVEN" in the last 168 hours.  Recent Results (from the past 240 hour(s))  Surgical PCR screen     Status: None   Collection Time: 10/14/22  3:28 PM   Specimen: Nasal Mucosa; Nasal Swab  Result Value Ref Range Status   MRSA, PCR NEGATIVE NEGATIVE Final   Staphylococcus aureus NEGATIVE NEGATIVE Final    Comment: (NOTE) The Xpert SA Assay (FDA approved for NASAL specimens in patients 14 years of age and older), is one component of a comprehensive surveillance program. It is not intended to diagnose infection nor to guide or monitor treatment. Performed at [facility], [place] [address], [place], [place] [address]          Radiology Studies: DG Cervical Spine 2 or 3 views  Result Date: 10/14/2022 CLINICAL DATA:  11:37 a.m. 1. Electro cervical surgery. Discectomy and fusion. Fluoro time: 23 seconds. Dose: 4.87 mGy. EXAM: INTRAOPERATIVE SPOT FLUOROSCOPIC RADIOGRAPHS, 3 IMAGES TOTAL COMPARISON:  Preoperative MRI yesterday. FINDINGS: C4-6 anterior plate fusion hardware is noted newly placed with interbody metallic disc space hardware. The  hardware is visible on 2 of the 3 submitted images and grossly intact on the 2 images. Vertebral body heights are maintained. The lateral alignment of the cervical spine is normal. IMPRESSION: C4-6 anterior plate and disc space fusion hardware placement. Electronically Signed   By: Telford Nab M.D.   On: 10/14/2022 23:27   DG C-Arm 1-60 Min-No Report  Result Date: 10/14/2022 Fluoroscopy was utilized by the requesting physician.  No radiographic interpretation.   DG C-Arm 1-60 Min-No Report  Result Date: 10/14/2022 Fluoroscopy was utilized by the requesting physician.  No radiographic interpretation.   DG C-Arm 1-60 Min-No Report  Result Date: 10/14/2022 Fluoroscopy was utilized by the requesting physician.  No radiographic interpretation.   MR SHOULDER LEFT WO CONTRAST  Result Date: 10/14/2022 CLINICAL DATA:  Shoulder pain.  Rotator cuff disorder suspected. EXAM: MRI OF THE LEFT SHOULDER WITHOUT CONTRAST TECHNIQUE: Multiplanar, multisequence MR imaging of the shoulder was performed. No intravenous contrast was administered. COMPARISON:  Left shoulder radiographs 10/13/2022 FINDINGS: Rotator cuff: There is mild-to-moderate intermediate T2 signal and thickening tendinosis of the mid to anterior infraspinatus tendon footprint (coronal series 19 images 11 through 13). Within this tendinosis there is horizontal linear fluid bright signal indicating a partial-thickness tear that focally extends through the bursal tendon surface (sagittal series 21, images 21 and 22, coronal series 19 image 11). This tear measures up to 4 mm in AP dimension and 5 mm in transverse dimension, focally involving nearly the entire transverse dimension of the tendon, however there is no tendon retraction and this does not appear to extend through the articular tendon surface. There is longitudinal linear increased T2 signal with portions that are fluid bright at the deep aspect of the mid AP dimension of the infraspinatus  muscle (sagittal series 21 images 1 through 17). This is suggestive of an interstitial muscle tear, measuring up to 15 mm in craniocaudal dimension and 9 mm along the AP thickness of the muscle (as measured on sagittal image 11). The supraspinatus, subscapularis and teres minor are intact. Muscles:  No rotator cuff muscle atrophy or fatty infiltration. Biceps long head: Mild-to-moderate intermediate T2 signal and thickening tendinosis of the long head of the biceps tendon proximal to the bicipital groove. Acromioclavicular Joint: There are mild degenerative changes of the acromioclavicular joint including joint space narrowing, subchondral marrow edema, and peripheral osteophytosis. Type III acromion with mild downsloping of the anterolateral acromion. Trace fluid within the subacromial/subdeltoid bursa. Glenohumeral Joint: Mild-to-moderate thinning of the glenoid and  humeral head cartilage. Labrum: Mild degenerative irregularity of the peripheral tip of the posterior glenoid labrum (axial series 18 images 10 through 12). Bones:  No acute fracture. Other: None. IMPRESSION: 1. Mild-to-moderate mid to anterior infraspinatus tendinosis with focal partial-thickness tear of the measures only 4 mm AP dimension but involves the majority of the transverse dimension of the tendon and extends through the bursal tendon surface. 2. Longitudinal interstitial tear within the deep aspect of the mid AP dimension of the infraspinatus muscle, measuring up to 15 mm in craniocaudal dimension. 3. Mild-to-moderate proximal long head of the biceps tendinosis. 4. Mild degenerative changes of the acromioclavicular joint. Type 3 acromion with mild downsloping of the anterolateral acromion. 5. Mild-to-moderate thinning of the glenoid and humeral head cartilage. Electronically Signed   By: Yvonne Kendall M.D.   On: 10/14/2022 08:38   MR CERVICAL SPINE WO CONTRAST  Result Date: 10/14/2022 CLINICAL DATA:  Initial evaluation for cervical  radiculopathy. Chronic numbness of the left fourth and fifth digits. EXAM: MRI CERVICAL SPINE WITHOUT CONTRAST TECHNIQUE: Multiplanar, multisequence MR imaging of the cervical spine was performed. No intravenous contrast was administered. COMPARISON:  Prior study from 02/06/2021. FINDINGS: Alignment: Straightening of the normal cervical lordosis. No listhesis. Vertebrae: Vertebral body height maintained without acute or chronic fracture. Bone marrow signal intensity within normal limits. No worrisome osseous lesions or abnormal marrow edema. Cord: Chronic myelomalacia seen involving the left greater than right cord at the level of C4-5 (series 8, image 23). Otherwise normal signal and morphology. Posterior Fossa, vertebral arteries, paraspinal tissues: Empty sella. Craniocervical junction normal. Paraspinous soft tissues demonstrate no acute finding. Normal flow voids seen within the vertebral arteries bilaterally. Disc levels: A degree of underlying congenital spinal stenosis noted. C2-C3: Small left paracentral disc protrusion with inferior migration indents the ventral thecal sac (series 8, image 13). Mild cord flattening without cord signal changes. Mild spinal stenosis. Left-sided uncovertebral spurring with resultant mild left C3 foraminal stenosis. Right neural foramen remains patent. C3-C4: Small central disc protrusion indents the ventral thecal sac (series 9, image 17). Mild spinal stenosis without frank cord impingement. Foramina remain patent. C4-C5: Irregular broad-based left paracentral disc osteophyte complex indents and effaces the ventral thecal sac (series 9, image 22). Secondary cord flattening with associated chronic myelomalacia. Mild ligament flavum hypertrophy. Resultant severe spinal stenosis with the thecal sac measuring 3-4 mm in AP diameter at its most narrow point. Superimposed uncovertebral spurring with resultant severe bilateral C5 foraminal stenosis. C5-C6: Right paracentral disc  osteophyte complex indents the right ventral thecal sac, contacting and flattening the right hemicord (series 9, image 27). No cord signal changes. Moderate spinal stenosis. Left worse than right uncovertebral spurring with resultant moderate left C6 foraminal stenosis. Right neural foramen remains patent. C6-C7: Bilateral uncovertebral spurring with minimal disc bulge. Mild facet hypertrophy. No spinal stenosis. Mild bilateral C7 foraminal narrowing. C7-T1:  Negative interspace.  Mild facet hypertrophy.  No stenosis. Overall, these changes are similar as compared to previous MRI from 02/06/2021. IMPRESSION: 1. Left paracentral disc osteophyte complex at C4-5 with resultant severe spinal stenosis, with severe bilateral C5 foraminal narrowing. Associated cord flattening with chronic myelomalacia involving the left greater than right cord at this level. 2. Right paracentral disc osteophyte complex at C5-6 with resultant moderate spinal stenosis. Left-sided uncovertebral spurring with resultant moderate left C6 foraminal stenosis. 3. Small left paracentral disc protrusion at C2-3 with resultant mild spinal stenosis. 4. Small central disc protrusion at C3-4 with resultant mild spinal stenosis. Electronically Signed  By: Jeannine Boga M.D.   On: 10/14/2022 04:10   ECHOCARDIOGRAM COMPLETE  Result Date: 10/13/2022    ECHOCARDIOGRAM REPORT   Patient Name:   ARIELA MOCHIZUKI Date of Exam: 10/13/2022 Medical Rec #:  [id number]     Height:       64.0 in Accession #:    [id number]    Weight:       208.1 lb Date of Birth:  May 05, 1951    BSA:          1.990 m Patient Age:    71 years      BP:           160/74 mmHg Patient Gender: F             HR:           71 bpm. Exam Location:  Inpatient Procedure: 2D Echo, Color Doppler, Cardiac Doppler and Intracardiac            Opacification Agent Indications:    TIA  History:        Patient has no prior history of Echocardiogram examinations.                 Risk Factors:Diabetes,  Hypertension, Dyslipidemia and Current                 Smoker.  Sonographer:    Eartha Inch Referring Phys: [id number] VISHAL R PATEL  Sonographer Comments: Technically difficult study due to poor echo windows. Image acquisition challenging due to patient body habitus and Image acquisition challenging due to respiratory motion. IMPRESSIONS  1. Left ventricular ejection fraction, by estimation, is 60 to 65%. The left ventricle has normal function. The left ventricle has no regional wall motion abnormalities. There is moderate concentric left ventricular hypertrophy. Left ventricular diastolic parameters are consistent with Grade I diastolic dysfunction (impaired relaxation).  2. Right ventricular systolic function is normal. The right ventricular size is normal. Tricuspid regurgitation signal is inadequate for assessing PA pressure.  3. The mitral valve is normal in structure. No evidence of mitral valve regurgitation. No evidence of mitral stenosis. Moderate mitral annular calcification.  4. The aortic valve is tricuspid. There is mild calcification of the aortic valve. Aortic valve regurgitation is not visualized. No aortic stenosis is present.  5. The inferior vena cava is normal in size with greater than 50% respiratory variability, suggesting right atrial pressure of 3 mmHg. FINDINGS  Left Ventricle: Left ventricular ejection fraction, by estimation, is 60 to 65%. The left ventricle has normal function. The left ventricle has no regional wall motion abnormalities. Definity contrast agent was given IV to delineate the left ventricular  endocardial borders. The left ventricular internal cavity size was normal in size. There is moderate concentric left ventricular hypertrophy. Left ventricular diastolic parameters are consistent with Grade I diastolic dysfunction (impaired relaxation). Right Ventricle: The right ventricular size is normal. No increase in right ventricular wall thickness. Right ventricular systolic  function is normal. Tricuspid regurgitation signal is inadequate for assessing PA pressure. Left Atrium: Left atrial size was normal in size. Right Atrium: Right atrial size was normal in size. Pericardium: Trivial pericardial effusion is present. Mitral Valve: The mitral valve is normal in structure. There is mild calcification of the mitral valve leaflet(s). Moderate mitral annular calcification. No evidence of mitral valve regurgitation. No evidence of mitral valve stenosis. MV peak gradient, 6.5 mmHg. The mean mitral valve gradient is 3.0 mmHg. Tricuspid Valve: The tricuspid valve is normal in  structure. Tricuspid valve regurgitation is trivial. Aortic Valve: The aortic valve is tricuspid. There is mild calcification of the aortic valve. Aortic valve regurgitation is not visualized. No aortic stenosis is present. Pulmonic Valve: The pulmonic valve was normal in structure. Pulmonic valve regurgitation is not visualized. Aorta: The aortic root is normal in size and structure. Venous: The inferior vena cava is normal in size with greater than 50% respiratory variability, suggesting right atrial pressure of 3 mmHg. IAS/Shunts: No atrial level shunt detected by color flow Doppler.  LEFT VENTRICLE PLAX 2D LVIDd:         4.00 cm   Diastology LVIDs:         2.70 cm   LV e' medial:    5.43 cm/s LV PW:         1.10 cm   LV E/e' medial:  16.0 LV IVS:        1.00 cm   LV e' lateral:   6.22 cm/s LVOT diam:     2.00 cm   LV E/e' lateral: 14.0 LV SV:         84 LV SV Index:   42 LVOT Area:     3.14 cm  RIGHT VENTRICLE             IVC RV S prime:     15.50 cm/s  IVC diam: 1.50 cm TAPSE (M-mode): 2.3 cm LEFT ATRIUM           Index        RIGHT ATRIUM           Index LA diam:      3.60 cm 1.81 cm/m   RA Area:     10.90 cm LA Vol (A4C): 76.8 ml 38.60 ml/m  RA Volume:   19.90 ml  10.00 ml/m  AORTIC VALVE LVOT Vmax:   117.00 cm/s LVOT Vmean:  82.600 cm/s LVOT VTI:    0.268 m  AORTA Ao Root diam: 3.20 cm Ao Asc diam:  3.20 cm  MITRAL VALVE MV Area (PHT): 2.13 cm     SHUNTS MV Area VTI:   2.45 cm     Systemic VTI:  0.27 m MV Peak grad:  6.5 mmHg     Systemic Diam: 2.00 cm MV Mean grad:  3.0 mmHg MV Vmax:       1.27 m/s MV Vmean:      77.0 cm/s MV Decel Time: 356 msec MV E velocity: 86.90 cm/s MV A velocity: 119.00 cm/s MV E/A ratio:  0.73 Dalton McleanMD Electronically signed by Franki Monte Signature Date/Time: 10/13/2022/3:34:00 PM    Final         Scheduled Meds:  [START ON 10/17/2022] aspirin  325 mg Oral Daily   vitamin B-12  1,000 mcg Oral Daily   docusate sodium  100 mg Oral BID   [START ON 10/16/2022] enoxaparin (LOVENOX) injection  40 mg Subcutaneous Q24H   gabapentin  300 mg Oral TID   insulin aspart  0-5 Units Subcutaneous QHS   insulin aspart  0-9 Units Subcutaneous TID WC   irbesartan  300 mg Oral Daily   levothyroxine  150 mcg Oral QAC breakfast   lidocaine  1 patch Transdermal Q24H   mupirocin ointment  1 Application Nasal BID   nicotine  21 mg Transdermal Daily   rosuvastatin  40 mg Oral Daily   sodium chloride flush  3 mL Intravenous Once   sodium chloride flush  3 mL Intravenous Q12H   Continuous  Infusions:  sodium chloride     0.9 % NaCl with KCl 20 mEq / L 75 mL/hr at 10/15/22 0055    ceFAZolin (ANCEF) IV 1 g (10/15/22 1427)   lactated ringers       LOS: 1 day    Time spent: 35 minutes.     Elmarie Shiley, MD [facility]   If 7PM-7AM, please contact night-coverage [email or web address]  10/15/2022, 2:51 PM

## 2022-10-15 NOTE — Progress Notes (Signed)
Occupational Therapy Treatment/Re-evaluation Patient Details Name: Pamela Cherry MRN: [id number] DOB: [birth year] Today's Date: 10/15/2022   History of present illness Pt is a 71 y/o female who presented with L sided numbness and weakness. MRI brain negative for acute infarct though MRI cervical spine showed severe spinal stenosis with cord compression and signal change. Pt underwent ACDF of C4-5, C5-5 on 11/2. 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   OT comments  Pt seen for first OT session s/p ACDF. L sided numbness and weakness persists though pt still moving fairly well functionally. Educated re: cervical precautions for ADLs, general body mechanics, use of soft cervical collar and safety precautions to implement at home. Pt able to mobilize with RW at min guard, functional status for ADLs same as initial OT eval. Continue to rec HHOT at [facility].   Recommendations for follow up therapy are one component of a multi-disciplinary discharge planning process, led by the attending physician.  Recommendations may be updated based on patient status, additional functional criteria and insurance authorization.    Follow Up Recommendations  Home health OT    Assistance Recommended at Discharge Set up Supervision/Assistance  Patient can return home with the following  Assistance with cooking/housework;Assist for transportation   Equipment Recommendations  None recommended by OT    Recommendations for Other Services      Precautions / Restrictions Precautions Precautions: Fall;Other (comment) Precaution Comments: R LE prosthetic (present in room) Restrictions Weight Bearing Restrictions: No       Mobility Bed Mobility Overal bed mobility: Needs Assistance Bed Mobility: Supine to Sit     Supine to sit: Supervision, HOB elevated          Transfers Overall transfer level: Needs assistance Equipment used: Rolling walker (2  wheels) Transfers: Sit to/from Stand Sit to Stand: Min guard                 Balance Overall balance assessment: Needs assistance Sitting-balance support: No upper extremity supported, Feet supported Sitting balance-Leahy Scale: Good     Standing balance support: Reliant on assistive device for balance, Bilateral upper extremity supported Standing balance-Leahy Scale: Poor                             ADL either performed or assessed with clinical judgement   ADL Overall ADL's : Needs assistance/impaired                 Upper Body Dressing : Set up   Lower Body Dressing: Min guard;Sit to/from stand Lower Body Dressing Details (indicate cue type and reason): able to easily don prosthetic sitting EOB             Functional mobility during ADLs: Min guard;Rolling walker (2 wheels) General ADL Comments: Educated on cervical precautions for ADLs, soft collar wear for extended OOB activities, body mechanics with transfers    Extremity/Trunk Assessment Upper Extremity Assessment Upper Extremity Assessment: LUE deficits/detail LUE Deficits / Details: slight weakness in comparison to R UE. numbness/tingling throughout, digit opposition slow and purposeful (able to complete with eyes closed also)   Lower Extremity Assessment Lower Extremity Assessment: Defer to PT evaluation        Vision   Vision Assessment?: No apparent visual deficits   Perception     Praxis      Cognition Arousal/Alertness: Awake/alert Behavior During Therapy: WFL for tasks assessed/performed Overall Cognitive Status:  Within Functional Limits for tasks assessed                                 General Comments: A&O x4        Exercises      Shoulder Instructions       General Comments      Pertinent Vitals/ Pain       Pain Assessment Pain Assessment: Faces Faces Pain Scale: Hurts a little bit Pain Location: neck Pain Descriptors / Indicators:  Sore Pain Intervention(s): Monitored during session, Premedicated before session  Home Living                                          Prior Functioning/Environment              Frequency  Min 2X/week        Progress Toward Goals  OT Goals(current goals can now be found in the care plan section)  Progress towards OT goals: Progressing toward goals  Acute Rehab OT Goals Patient Stated Goal: improve strength/numbness OT Goal Formulation: With patient Time For Goal Achievement: 10/27/22 Potential to Achieve Goals: Good ADL Goals Pt Will Perform Lower Body Bathing: with modified independence;sit to/from stand Pt Will Transfer to Toilet: with modified independence;ambulating Additional ADL Goal #1: Pt to demo ability to gather ADL/IADL items with MOD I without LOB or safety concerns  Plan Discharge plan remains appropriate    Co-evaluation                 AM-PAC OT "6 Clicks" Daily Activity     Outcome Measure   Help from another person eating meals?: None Help from another person taking care of personal grooming?: A Little Help from another person toileting, which includes using toliet, bedpan, or urinal?: A Little Help from another person bathing (including washing, rinsing, drying)?: A Little Help from another person to put on and taking off regular upper body clothing?: A Little Help from another person to put on and taking off regular lower body clothing?: A Little 6 Click Score: 19    End of Session Equipment Utilized During Treatment: Gait belt;Rolling walker (2 wheels);Cervical collar  OT Visit Diagnosis: Muscle weakness (generalized) (M62.81)   Activity Tolerance Patient tolerated treatment well   Patient Left in bed;with call bell/phone within reach   Nurse Communication Mobility status        Time: 3382-5053 OT Time Calculation (min): 15 min  Charges: OT General Charges $OT Visit: 1 Visit OT Evaluation $OT Eval Low  Complexity: 1 Low  Malachy Chamber, OTR/L Acute Rehab Services Office: [phone]   Layla Maw 10/15/2022, 10:12 AM

## 2022-10-15 NOTE — Progress Notes (Signed)
OT Cancellation Note  Patient Details Name: Pamela Cherry MRN: [id number] DOB: 19-Feb-1951   Cancelled Treatment:    Reason Eval/Treat Not Completed: Patient declined, no reason specified Attempted to initiate OT eval this AM though pt initially reported "not today". After education on need to address precautions post op, pt agreeable for OT to follow up later this AM with timing around additional pain meds.  Layla Maw 10/15/2022, 7:35 AM

## 2022-10-16 DIAGNOSIS — G9520 Unspecified cord compression: Secondary | ICD-10-CM | POA: Diagnosis not present

## 2022-10-16 DIAGNOSIS — R200 Anesthesia of skin: Secondary | ICD-10-CM | POA: Diagnosis not present

## 2022-10-16 LAB — BASIC METABOLIC PANEL
Anion gap: 6 (ref 5–15)
BUN: 16 mg/dL (ref 8–23)
CO2: 26 mmol/L (ref 22–32)
Calcium: 8.1 mg/dL — ABNORMAL LOW (ref 8.9–10.3)
Chloride: 106 mmol/L (ref 98–111)
Creatinine, Ser: 0.79 mg/dL (ref 0.44–1.00)
GFR, Estimated: >60 mL/min (ref >60)
Glucose, Bld: 177 mg/dL — ABNORMAL HIGH (ref 70–99)
Potassium: 3.7 mmol/L (ref 3.5–5.1)
Sodium: 138 mmol/L (ref 135–145)

## 2022-10-16 LAB — CBC
HCT: 30.5 % — ABNORMAL LOW (ref 36.0–46.0)
Hemoglobin: 9 g/dL — ABNORMAL LOW (ref 12.0–15.0)
MCH: 23.5 pg — ABNORMAL LOW (ref 26.0–34.0)
MCHC: 29.5 g/dL — ABNORMAL LOW (ref 30.0–36.0)
MCV: 79.6 fL — ABNORMAL LOW (ref 80.0–100.0)
Platelets: 208 10*3/uL (ref 150–400)
RBC: 3.83 MIL/uL — ABNORMAL LOW (ref 3.87–5.11)
RDW: 18.3 % — ABNORMAL HIGH (ref 11.5–15.5)
WBC: 9.3 10*3/uL (ref 4.0–10.5)
nRBC: 0 % (ref 0.0–0.2)

## 2022-10-16 LAB — GLUCOSE, CAPILLARY
Glucose-Capillary: 147 mg/dL — ABNORMAL HIGH (ref 70–99)
Glucose-Capillary: 148 mg/dL — ABNORMAL HIGH (ref 70–99)
Glucose-Capillary: 169 mg/dL — ABNORMAL HIGH (ref 70–99)
Glucose-Capillary: 215 mg/dL — ABNORMAL HIGH (ref 70–99)

## 2022-10-16 NOTE — Progress Notes (Signed)
PROGRESS NOTE    Pamela Cherry  OMB:[id number] DOB: 16-Jun-1951 DOA: 10/12/2022 PCP: Susy Frizzle, MD   Brief Narrative: 71 year old with past medical history significant for diabetes type 2, hypertension, hyperlipidemia, hypothyroidism, status post right BKA, tobacco use, chronic numbness of the left fourth and fifth finger who presented to the ED for evaluation of new numbness and weakness on her left upper and lower extremities.  Over the last 1 or 2 weeks he has noted intermittent numbness involving the whole left hand.  The day of admission around 1530 she developed new numbness involving her left arm.  Subsequently developed left leg and left face numbness.  No associated focal weakness, change in speech or change in vision. CT head without contrast was negative for acute infarct.  Mild white matter hypodensity in the frontal lobe bilaterally seen most likely due to chronic microvascular ischemia.  Patient has been admitted for further CVA work-up.  Assessment & Plan:   Principal Problem:   Left sided numbness Active Problems:   Hypertension associated with diabetes ([facility])   Type 2 diabetes mellitus without complication (HCC)   Acquired hypothyroidism   Hyperlipidemia associated with type 2 diabetes mellitus (H[facility])   Tobacco use  1-Left side  numbness: left arm and leg. Secondary to Cervical Spondylitic Myelopathy.  -MRI: No acute intracranial abnormality. Empty sella. -B 12: 183. Started  supplement.  -CTA: Negative for CTA large vessel occlusion.  Moderate atheromatous changes in the right carotid bulb/proximal right ICA with associated stenosis up to 65%.--Neurology recommendation -Cervical MRI showed:  Left paracentral disc osteophyte complex at C4-5 with resultant severe spinal stenosis, with severe bilateral C5 foraminal narrowing. Associated cord flattening with chronic myelomalacia involving the left greater than right cord at this level.. Right paracentral disc  osteophyte complex at C5-6 with resultant moderate spinal stenosis. Left-sided uncovertebral spurring with resultant moderate left C6 foraminal stenosis. Small left paracentral disc protrusion at C2-3 with resultant mild spinal stenosis. Small central disc protrusion at C3-4 with resultant mild spinal stenosis. -Neurosurgery consulted, underwent  C4-C5, C5-C6 Placement of intervertebral biomechanical device by Dr Marcello Moores.  Doing well post sx. Per neuro sx ok to resume aspirin on 11/05 and DVT prophylaxis 11/04 -Discharge home when clear by neurosurgery.   2-Hypertension associated with diabetes: Continue with Olmersartan.   3-Diabetes type 2: SSI.  Hold metformin and glipizide while inpatient  4-Tobacco  use: Nicotine patch.  Counseling.   5-Hyperlipidemia Continue with Crestor.  6-Acquired hypothyroidism TSH at  10, increased Synthroid to 150 mcg  Right shoulder pain; X ray negative.  MRI: Mild to moderate mid anterior infraspinatus tendinosis with focal partial thickness tear of the measure only 4 mm in AP dimension but involve the majority of the transverse dimension of the tendon and extending through the bursal tendon surface.  Longitudinal instrumentation tear with the deep aspect of the mid AP dimension of the infraspinatus muscle measuring up to 15 mm in craniocaudal dimension.  Long head of the biceps tendinosis mild to moderate, mild degenerative changes of the acromion clavicular joint. She is not having significant pain today, and is able to raise left arm  Discussed with Ortho, plan for OT, pain management, likely frozen shoulder.   Estimated body mass index is 35.72 kg/m as calculated from the following:   Height as of this encounter: '5\' 4"'$  (1.626 m).   Weight as of this encounter: 94.4 kg.   DVT prophylaxis: Lovenox Code Status: Full code Family Communication: care discussed with patient.  Disposition Plan:  Status is: Observation The patient will require care  spanning > 2 midnights and should be moved to inpatient because: cervical myelopathy requiring Sx    Consultants:  Neurology   Procedures:  ECHO   Antimicrobials:    Subjective: She report shoulder pain, able to moves all 4 extremities.   Objective: Vitals:   10/15/22 2017 10/16/22 0017 10/16/22 0329 10/16/22 0935  BP: (!) 114/47 (!) 118/51 (!) 127/56 123/60  Pulse: 85 83 84 91  Resp: '12 18 14 16  '$ Temp: (!) 97.5 F (36.4 C) 98.7 F (37.1 C) 98.3 F (36.8 C) 98.1 F (36.7 C)  TempSrc: Oral Oral Oral Oral  SpO2: 98% 95% 98% 98%  Weight:      Height:        Intake/Output Summary (Last 24 hours) at 10/16/2022 1159 Last data filed at 10/16/2022 0500 Gross per 24 hour  Intake 1389.01 ml  Output 1000 ml  Net 389.01 ml    Filed Weights   10/12/22 1700 10/12/22 1757  Weight: 94.4 kg 94.4 kg    Examination:  General exam:  NAD Respiratory system: CTA Cardiovascular system: S 1, S 2 RRR Gastrointestinal system: BS present,soft, nt Central nervous system: alert, follows command Extremities: no edema   Data Reviewed: I have personally reviewed following labs and imaging studies  CBC: Recent Labs  Lab 10/12/22 1727 10/12/22 1732 10/13/22 0307 10/15/22 0912 10/16/22 0809  WBC 7.8  --  5.6 9.2 9.3  NEUTROABS 5.7  --   --   --   --   HGB 11.3* 12.6 10.0* 10.4* 9.0*  HCT 36.8 37.0 32.8* 35.5* 30.5*  MCV 78.5*  --  79.2* 80.3 79.6*  PLT 236  --  228 258 588    Basic Metabolic Panel: Recent Labs  Lab 10/12/22 1727 10/12/22 1732 10/13/22 0307 10/15/22 0912 10/16/22 0809  NA 141 138 139 138 138  K 4.3 4.2 3.6 4.6 3.7  CL 104 103 105 107 106  CO2 24  --  '23 23 26  '$ GLUCOSE 198* 194* 131* 186* 177*  BUN '12 13 12 11 16  '$ CREATININE 0.78 0.60 0.68 0.66 0.79  CALCIUM 9.4  --  8.9 8.6* 8.1*    GFR: Estimated Creatinine Clearance: 70.8 mL/min (by C-G formula based on SCr of 0.79 mg/dL). Liver Function Tests: Recent Labs  Lab 10/12/22 1727  AST 30  ALT  13  ALKPHOS 84  BILITOT 0.7  PROT 6.6  ALBUMIN 3.5    No results for input(s): "LIPASE", "AMYLASE" in the last 168 hours. No results for input(s): "AMMONIA" in the last 168 hours. Coagulation Profile: Recent Labs  Lab 10/12/22 1727  INR 1.1    Cardiac Enzymes: No results for input(s): "CKTOTAL", "CKMB", "CKMBINDEX", "TROPONINI" in the last 168 hours. BNP (last 3 results) No results for input(s): "PROBNP" in the last 8760 hours. HbA1C: No results for input(s): "HGBA1C" in the last 72 hours.  CBG: Recent Labs  Lab 10/15/22 0831 10/15/22 1200 10/15/22 1636 10/15/22 2102 10/16/22 0607  GLUCAP 184* 242* 156* 217* 169*    Lipid Profile: No results for input(s): "CHOL", "HDL", "LDLCALC", "TRIG", "CHOLHDL", "LDLDIRECT" in the last 72 hours.  Thyroid Function Tests: No results for input(s): "TSH", "T4TOTAL", "FREET4", "T3FREE", "THYROIDAB" in the last 72 hours.  Anemia Panel: No results for input(s): "VITAMINB12", "FOLATE", "FERRITIN", "TIBC", "IRON", "RETICCTPCT" in the last 72 hours.  Sepsis Labs: No results for input(s): "PROCALCITON", "LATICACIDVEN" in the last 168 hours.  Recent  Results (from the past 240 hour(s))  Surgical PCR screen     Status: None   Collection Time: 10/14/22  3:28 PM   Specimen: Nasal Mucosa; Nasal Swab  Result Value Ref Range Status   MRSA, PCR NEGATIVE NEGATIVE Final   Staphylococcus aureus NEGATIVE NEGATIVE Final    Comment: (NOTE) The Xpert SA Assay (FDA approved for NASAL specimens in patients 58 years of age and older), is one component of a comprehensive surveillance program. It is not intended to diagnose infection nor to guide or monitor treatment. Performed at [facility], [place] [address], [place], [place] [address]          Radiology Studies: DG Cervical Spine 2 or 3 views  Result Date: 10/14/2022 CLINICAL DATA:  11:37 a.m. 1. Electro cervical surgery. Discectomy and fusion. Fluoro time: 23 seconds. Dose: 4.87  mGy. EXAM: INTRAOPERATIVE SPOT FLUOROSCOPIC RADIOGRAPHS, 3 IMAGES TOTAL COMPARISON:  Preoperative MRI yesterday. FINDINGS: C4-6 anterior plate fusion hardware is noted newly placed with interbody metallic disc space hardware. The hardware is visible on 2 of the 3 submitted images and grossly intact on the 2 images. Vertebral body heights are maintained. The lateral alignment of the cervical spine is normal. IMPRESSION: C4-6 anterior plate and disc space fusion hardware placement. Electronically Signed   By: Telford Nab M.D.   On: 10/14/2022 23:27   DG C-Arm 1-60 Min-No Report  Result Date: 10/14/2022 Fluoroscopy was utilized by the requesting physician.  No radiographic interpretation.   DG C-Arm 1-60 Min-No Report  Result Date: 10/14/2022 Fluoroscopy was utilized by the requesting physician.  No radiographic interpretation.   DG C-Arm 1-60 Min-No Report  Result Date: 10/14/2022 Fluoroscopy was utilized by the requesting physician.  No radiographic interpretation.        Scheduled Meds:  [START ON 10/17/2022] aspirin  325 mg Oral Daily   vitamin B-12  1,000 mcg Oral Daily   docusate sodium  100 mg Oral BID   enoxaparin (LOVENOX) injection  40 mg Subcutaneous Q24H   gabapentin  300 mg Oral TID   insulin aspart  0-5 Units Subcutaneous QHS   insulin aspart  0-9 Units Subcutaneous TID WC   irbesartan  300 mg Oral Daily   levothyroxine  150 mcg Oral QAC breakfast   lidocaine  1 patch Transdermal Q24H   mupirocin ointment  1 Application Nasal BID   nicotine  21 mg Transdermal Daily   rosuvastatin  40 mg Oral Daily   sodium chloride flush  3 mL Intravenous Once   sodium chloride flush  3 mL Intravenous Q12H   Continuous Infusions:  sodium chloride     sodium chloride 75 mL/hr at 10/16/22 0919   lactated ringers       LOS: 2 days    Time spent: 35 minutes.     Elmarie Shiley, MD [facility]   If 7PM-7AM, please contact  night-coverage [email or web address]  10/16/2022, 11:59 AM

## 2022-10-16 NOTE — Progress Notes (Signed)
Mobility Specialist Progress Note:   10/16/22 1535  Mobility  Activity Ambulated with assistance in hallway  Level of Assistance Standby assist, set-up cues, supervision of patient - no hands on  Assistive Device Front wheel walker  Distance Ambulated (ft) 200 ft  Activity Response Tolerated well  Mobility Referral Yes  $Mobility charge 1 Mobility   Pt received in chair and agreeable. No complaints. Pt left in bed with all needs met and call bell in reach.   Liat Mayol Mobility Specialist-Acute Rehab Secure Chat only

## 2022-10-16 NOTE — Progress Notes (Signed)
Pt insisting on putting on prosthetic leg and walking up and down in the hall to see what everybody was doing. I explained. Everybody is sleeping and she can't get up by herself. Pt stated "I most certainly can!" I stated she couldn't because she may fall and hurt herself. Pt stated "I'm not going to fall." I explained nobody ever plans on falling it just happens and I just gave you a pain pill. Bed alarm set. Pt refusing to lay back down and is sitting on the side of the bed. I explained if she attempted to get up by herself I would put the prosthetic leg away from her so she couldn't get up and fall.

## 2022-10-16 NOTE — Progress Notes (Signed)
Physical Therapy Treatment Patient Details Name: Pamela Cherry MRN: [id number] DOB: [birth year] Today's Date: 10/16/2022   History of Present Illness Pt is a 71 y/o female who presented with L sided numbness and weakness. MRI brain negative for acute infarct though MRI cervical spine showed severe spinal stenosis with cord compression and signal change. Pt underwent ACDF of C4-5, C5-5 on 11/2. 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    PT Comments    Pt supine in bed on arrival.  Reports feeling comfortable and initially refusing PT session.  Pt required max cues for encouragement this session.  Once in standing she reports feeling better and tolerated more activity then she initially thought she could.     Recommendations for follow up therapy are one component of a multi-disciplinary discharge planning process, led by the attending physician.  Recommendations may be updated based on patient status, additional functional criteria and insurance authorization.  Follow Up Recommendations  Home health PT     Assistance Recommended at Discharge Intermittent Supervision/Assistance  Patient can return home with the following Assistance with cooking/housework;A little help with bathing/dressing/bathroom;A little help with walking and/or transfers;Help with stairs or ramp for entrance;Assist for transportation   Equipment Recommendations  None recommended by PT    Recommendations for Other Services       Precautions / Restrictions Precautions Precautions: Fall;Other (comment);Cervical Precaution Booklet Issued: Yes (comment) Precaution Comments: R LE prosthetic (present in room) Required Braces or Orthoses: Cervical Brace Cervical Brace: Soft collar;For comfort Restrictions Weight Bearing Restrictions: No     Mobility  Bed Mobility Overal bed mobility: Needs Assistance Bed Mobility: Supine to Sit, Sit to Supine      Supine to sit: Supervision, HOB elevated Sit to supine: Supervision, HOB elevated   General bed mobility comments: HOB elevated and pt able to move to long sit in bed then scoot LE's off EOB with supervision only and extra time.    Transfers Overall transfer level: Needs assistance Equipment used: Rolling walker (2 wheels) Transfers: Sit to/from Stand Sit to Stand: Supervision           General transfer comment: min guard for safety. pt using bil UE's to power up and able to steady self with RW.  Pt able to donn and doff R prosthesis independently.    Ambulation/Gait Ambulation/Gait assistance: Min guard, Min assist Gait Distance (Feet): 180 Feet Assistive device: Rolling walker (2 wheels) Gait Pattern/deviations: Step-to pattern, Step-through pattern, Decreased stride length, Decreased dorsiflexion - left Gait velocity: decr     General Gait Details: pt required cues at start to maintain safe distance to RW, improved throughout. no bucklign of LE's or LOB noted. Pt's Lt LE adducting slightly due to impaired proprioception. Pt's Lt ankle also in supinated position with initial strike during stance phase occuring on lateral foot and pt dropping into pronation during stance.   Stairs             Wheelchair Mobility    Modified Rankin (Stroke Patients Only) Modified Rankin (Stroke Patients Only) Pre-Morbid Rankin Score: No significant disability Modified Rankin: Moderately severe disability     Balance Overall balance assessment: Needs assistance Sitting-balance support: No upper extremity supported, Feet supported Sitting balance-Leahy Scale: Good Sitting balance - Comments: able to don Rt prosthesis in sitting   Standing balance support: Reliant on assistive device for balance, Bilateral upper extremity supported Standing balance-Leahy Scale: Poor  Cognition Arousal/Alertness: Awake/alert Behavior During Therapy: WFL  for tasks assessed/performed Overall Cognitive Status: Within Functional Limits for tasks assessed                                 General Comments: A&O x4        Exercises      General Comments        Pertinent Vitals/Pain Pain Assessment Pain Assessment: Faces Faces Pain Scale: Hurts a little bit Pain Location: neck Pain Descriptors / Indicators: Sore Pain Intervention(s): Monitored during session, Repositioned    Home Living                          Prior Function            PT Goals (current goals can now be found in the care plan section) Acute Rehab PT Goals Patient Stated Goal: return home Potential to Achieve Goals: Good Additional Goals Additional Goal #1: Will increase L LE strength to 5/5 for improved transfers/ambulation Progress towards PT goals: Progressing toward goals    Frequency    Min 5X/week      PT Plan Current plan remains appropriate    Co-evaluation              AM-PAC PT "6 Clicks" Mobility   Outcome Measure  Help needed turning from your back to your side while in a flat bed without using bedrails?: None Help needed moving from lying on your back to sitting on the side of a flat bed without using bedrails?: A Little Help needed moving to and from a bed to a chair (including a wheelchair)?: A Little Help needed standing up from a chair using your arms (e.g., wheelchair or bedside chair)?: A Little Help needed to walk in hospital room?: A Little Help needed climbing 3-5 steps with a railing? : A Lot 6 Click Score: 18    End of Session Equipment Utilized During Treatment: Gait belt;Cervical collar Activity Tolerance: Patient tolerated treatment well Patient left: in chair;with call bell/phone within reach;with chair alarm set Nurse Communication: Mobility status PT Visit Diagnosis: Other abnormalities of gait and mobility (R26.89);Hemiplegia and hemiparesis Hemiplegia - Right/Left: Left Hemiplegia  - dominant/non-dominant: Non-dominant Hemiplegia - caused by: Unspecified (cervical)     Time: 2703-5009 PT Time Calculation (min) (ACUTE ONLY): 23 min  Charges:  $Gait Training: 8-22 mins $Therapeutic Exercise: 8-22 mins                     Erasmo Leventhal , PTA Acute Rehabilitation Services Office [phone]    Cristela Blue 10/16/2022, 12:59 PM

## 2022-10-16 NOTE — Progress Notes (Signed)
NEUROSURGERY PROGRESS NOTE  Doing well. Complains of appropriate neck soreness. No arm pain No numbness, tingling or weakness Ambulating and voiding well Good strength and sensation Incision CDI  Temp:  [97.5 F (36.4 C)-98.7 F (37.1 C)] 98.3 F (36.8 C) (11/04 0329) Pulse Rate:  [68-85] 84 (11/04 0329) Resp:  [12-18] 14 (11/04 0329) BP: (114-127)/(47-56) 127/56 (11/04 0329) SpO2:  [95 %-98 %] 98 % (11/04 0329)  Plan: S/p acdf, doing well, therapy today  Pamela Chiquito, NP 10/16/2022 8:32 AM

## 2022-10-17 ENCOUNTER — Inpatient Hospital Stay (HOSPITAL_COMMUNITY): Payer: Medicare HMO

## 2022-10-17 DIAGNOSIS — R200 Anesthesia of skin: Secondary | ICD-10-CM | POA: Diagnosis not present

## 2022-10-17 DIAGNOSIS — G9520 Unspecified cord compression: Secondary | ICD-10-CM | POA: Diagnosis not present

## 2022-10-17 LAB — GLUCOSE, CAPILLARY
Glucose-Capillary: 150 mg/dL — ABNORMAL HIGH (ref 70–99)
Glucose-Capillary: 155 mg/dL — ABNORMAL HIGH (ref 70–99)
Glucose-Capillary: 117 mg/dL — ABNORMAL HIGH (ref 70–99)
Glucose-Capillary: 156 mg/dL — ABNORMAL HIGH (ref 70–99)
Glucose-Capillary: 182 mg/dL — ABNORMAL HIGH (ref 70–99)

## 2022-10-17 LAB — URINALYSIS, ROUTINE W REFLEX MICROSCOPIC
Bilirubin Urine: NEGATIVE
Glucose, UA: NEGATIVE mg/dL
Hgb urine dipstick: NEGATIVE
Ketones, ur: NEGATIVE mg/dL
Leukocytes,Ua: NEGATIVE
Nitrite: NEGATIVE
Protein, ur: NEGATIVE mg/dL
Specific Gravity, Urine: 1.009 (ref 1.005–1.030)
pH: 5 (ref 5.0–8.0)

## 2022-10-17 LAB — BASIC METABOLIC PANEL
Anion gap: 9 (ref 5–15)
BUN: 12 mg/dL (ref 8–23)
CO2: 24 mmol/L (ref 22–32)
Calcium: 8.3 mg/dL — ABNORMAL LOW (ref 8.9–10.3)
Chloride: 104 mmol/L (ref 98–111)
Creatinine, Ser: 0.73 mg/dL (ref 0.44–1.00)
GFR, Estimated: >60 mL/min (ref >60)
Glucose, Bld: 147 mg/dL — ABNORMAL HIGH (ref 70–99)
Potassium: 3.6 mmol/L (ref 3.5–5.1)
Sodium: 137 mmol/L (ref 135–145)

## 2022-10-17 LAB — CBC
HCT: 28.1 % — ABNORMAL LOW (ref 36.0–46.0)
Hemoglobin: 8.4 g/dL — ABNORMAL LOW (ref 12.0–15.0)
MCH: 23.9 pg — ABNORMAL LOW (ref 26.0–34.0)
MCHC: 29.9 g/dL — ABNORMAL LOW (ref 30.0–36.0)
MCV: 80.1 fL (ref 80.0–100.0)
Platelets: 182 10*3/uL (ref 150–400)
RBC: 3.51 MIL/uL — ABNORMAL LOW (ref 3.87–5.11)
RDW: 18.4 % — ABNORMAL HIGH (ref 11.5–15.5)
WBC: 7.2 10*3/uL (ref 4.0–10.5)
nRBC: 0 % (ref 0.0–0.2)

## 2022-10-17 NOTE — Plan of Care (Signed)
  Problem: Skin Integrity: Goal: Risk for impaired skin integrity will decrease Outcome: Progressing   Problem: Education: Goal: Ability to verbalize activity precautions or restrictions will improve Outcome: Progressing Goal: Knowledge of the prescribed therapeutic regimen will improve Outcome: Progressing Goal: Understanding of discharge needs will improve Outcome: Progressing

## 2022-10-17 NOTE — Progress Notes (Signed)
PROGRESS NOTE    Pamela Cherry  IWL:[id number] DOB: 1951-04-03 DOA: 10/12/2022 PCP: Susy Frizzle, MD   Brief Narrative: 71 year old with past medical history significant for diabetes type 2, hypertension, hyperlipidemia, hypothyroidism, status post right BKA, tobacco use, chronic numbness of the left fourth and fifth finger who presented to the ED for evaluation of new numbness and weakness on her left upper and lower extremities.  Over the last 1 or 2 weeks he has noted intermittent numbness involving the whole left hand.  The day of admission around 1530 she developed new numbness involving her left arm.  Subsequently developed left leg and left face numbness.  No associated focal weakness, change in speech or change in vision. CT head without contrast was negative for acute infarct.  Mild white matter hypodensity in the frontal lobe bilaterally seen most likely due to chronic microvascular ischemia.  Patient has been admitted for further CVA work-up.  Assessment & Plan:   Principal Problem:   Left sided numbness Active Problems:   Hypertension associated with diabetes ([place])   Type 2 diabetes mellitus without complication (HCC)   Acquired hypothyroidism   Hyperlipidemia associated with type 2 diabetes mellitus (HCC)   Tobacco use  1-Left side  numbness: left arm and leg. Secondary to Cervical Spondylitic Myelopathy.  -MRI: No acute intracranial abnormality. Empty sella. -B 12: 183. Started  supplement.  -CTA: Negative for CTA large vessel occlusion.  Moderate atheromatous changes in the right carotid bulb/proximal right ICA with associated stenosis up to 65%.--Neurology recommendation -Cervical MRI showed:  Left paracentral disc osteophyte complex at C4-5 with resultant severe spinal stenosis, with severe bilateral C5 foraminal narrowing. Associated cord flattening with chronic myelomalacia involving the left greater than right cord at this level.. Right paracentral disc  osteophyte complex at C5-6 with resultant moderate spinal stenosis. Left-sided uncovertebral spurring with resultant moderate left C6 foraminal stenosis. Small left paracentral disc protrusion at C2-3 with resultant mild spinal stenosis. Small central disc protrusion at C3-4 with resultant mild spinal stenosis. -Neurosurgery consulted, underwent  C4-C5, C5-C6 Placement of intervertebral biomechanical device by Dr Marcello Moores.  Doing well post sx. Per neuro sx ok to resume aspirin on 11/05 and DVT prophylaxis 11/04 -ok to discharge per neurosurgery   2-Hypertension associated with diabetes: Continue with Olmersartan.   3-Diabetes type 2: SSI.  Hold metformin and glipizide while inpatient  4-Tobacco  use: Nicotine patch.  Counseling.   5-Hyperlipidemia Continue with Crestor.  6-Acquired hypothyroidism TSH at  10, increased Synthroid to 150 mcg  Fever;  Low grade fever last night.  Incentive spirometry.  Chest x ray negative.  UA  Right shoulder pain; X ray negative.  MRI: Mild to moderate mid anterior infraspinatus tendinosis with focal partial thickness tear of the measure only 4 mm in AP dimension but involve the majority of the transverse dimension of the tendon and extending through the bursal tendon surface.  Longitudinal instrumentation tear with the deep aspect of the mid AP dimension of the infraspinatus muscle measuring up to 15 mm in craniocaudal dimension.  Long head of the biceps tendinosis mild to moderate, mild degenerative changes of the acromion clavicular joint. She is not having significant pain today, and is able to raise left arm  Discussed with Ortho, plan for OT, pain management, likely frozen shoulder.   Estimated body mass index is 35.72 kg/m as calculated from the following:   Height as of this encounter: '5\' 4"'$  (1.626 m).   Weight as of this encounter: 94.4  kg.   DVT prophylaxis: Lovenox Code Status: Full code Family Communication: care discussed with  patient.  Disposition Plan:  Status is: Observation The patient will require care spanning > 2 midnights and should be moved to inpatient because: cervical myelopathy requiring Sx, remain overnight to monitor for fever.     Consultants:  Neurology   Procedures:  ECHO   Antimicrobials:    Subjective: She is alert, denies pain. Mild cough.  Low grade fever overnight  Objective: Vitals:   10/17/22 0343 10/17/22 0400 10/17/22 0759 10/17/22 1205  BP: (!) 151/63  (!) 149/51 (!) 106/52  Pulse: (!) 110  95 82  Resp: '18  18 16  '$ Temp: 99.9 F (37.7 C)  98.3 F (36.8 C) 98.2 F (36.8 C)  TempSrc: Oral   Oral  SpO2: (!) 85% (!) 88% 90% 96%  Weight:      Height:        Intake/Output Summary (Last 24 hours) at 10/17/2022 1301 Last data filed at 10/16/2022 1800 Gross per 24 hour  Intake 240 ml  Output --  Net 240 ml    Filed Weights   10/12/22 1700 10/12/22 1757  Weight: 94.4 kg 94.4 kg    Examination:  General exam:  NAD Respiratory system: CTA Cardiovascular system: S 1, S 2 RRR Gastrointestinal system: BS present, soft, nt Central nervous system: Alert, follows command Extremities: no edema   Data Reviewed: I have personally reviewed following labs and imaging studies  CBC: Recent Labs  Lab 10/12/22 1727 10/12/22 1732 10/13/22 0307 10/15/22 0912 10/16/22 0809 10/17/22 0742  WBC 7.8  --  5.6 9.2 9.3 7.2  NEUTROABS 5.7  --   --   --   --   --   HGB 11.3* 12.6 10.0* 10.4* 9.0* 8.4*  HCT 36.8 37.0 32.8* 35.5* 30.5* 28.1*  MCV 78.5*  --  79.2* 80.3 79.6* 80.1  PLT 236  --  228 258 208 625    Basic Metabolic Panel: Recent Labs  Lab 10/12/22 1727 10/12/22 1732 10/13/22 0307 10/15/22 0912 10/16/22 0809 10/17/22 0742  NA 141 138 139 138 138 137  K 4.3 4.2 3.6 4.6 3.7 3.6  CL 104 103 105 107 106 104  CO2 24  --  '23 23 26 24  '$ GLUCOSE 198* 194* 131* 186* 177* 147*  BUN '12 13 12 11 16 12  '$ CREATININE 0.78 0.60 0.68 0.66 0.79 0.73  CALCIUM 9.4  --  8.9  8.6* 8.1* 8.3*    GFR: Estimated Creatinine Clearance: 70.8 mL/min (by C-G formula based on SCr of 0.73 mg/dL). Liver Function Tests: Recent Labs  Lab 10/12/22 1727  AST 30  ALT 13  ALKPHOS 84  BILITOT 0.7  PROT 6.6  ALBUMIN 3.5    No results for input(s): "LIPASE", "AMYLASE" in the last 168 hours. No results for input(s): "AMMONIA" in the last 168 hours. Coagulation Profile: Recent Labs  Lab 10/12/22 1727  INR 1.1    Cardiac Enzymes: No results for input(s): "CKTOTAL", "CKMB", "CKMBINDEX", "TROPONINI" in the last 168 hours. BNP (last 3 results) No results for input(s): "PROBNP" in the last 8760 hours. HbA1C: No results for input(s): "HGBA1C" in the last 72 hours.  CBG: Recent Labs  Lab 10/16/22 2139 10/16/22 2342 10/17/22 0346 10/17/22 0756 10/17/22 1200  GLUCAP 148* 147* 150* 155* 117*    Lipid Profile: No results for input(s): "CHOL", "HDL", "LDLCALC", "TRIG", "CHOLHDL", "LDLDIRECT" in the last 72 hours.  Thyroid Function Tests: No results for input(s): "  TSH", "T4TOTAL", "FREET4", "T3FREE", "THYROIDAB" in the last 72 hours.  Anemia Panel: No results for input(s): "VITAMINB12", "FOLATE", "FERRITIN", "TIBC", "IRON", "RETICCTPCT" in the last 72 hours.  Sepsis Labs: No results for input(s): "PROCALCITON", "LATICACIDVEN" in the last 168 hours.  Recent Results (from the past 240 hour(s))  Surgical PCR screen     Status: None   Collection Time: 10/14/22  3:28 PM   Specimen: Nasal Mucosa; Nasal Swab  Result Value Ref Range Status   MRSA, PCR NEGATIVE NEGATIVE Final   Staphylococcus aureus NEGATIVE NEGATIVE Final    Comment: (NOTE) The Xpert SA Assay (FDA approved for NASAL specimens in patients 5 years of age and older), is one component of a comprehensive surveillance program. It is not intended to diagnose infection nor to guide or monitor treatment. Performed at [facility], [place] [address], [place], [place] [address]   Culture, blood  (Routine X 2) w Reflex to ID Panel     Status: None (Preliminary result)   Collection Time: 10/16/22  9:43 PM   Specimen: BLOOD LEFT HAND  Result Value Ref Range Status   Specimen Description BLOOD LEFT HAND  Final   Special Requests   Final    BOTTLES DRAWN AEROBIC AND ANAEROBIC Blood Culture adequate volume   Culture   Final    NO GROWTH < 12 HOURS Performed at [facility], [place] [address] [place], [place] [address]    Report Status PENDING  Incomplete  Culture, blood (Routine X 2) w Reflex to ID Panel     Status: None (Preliminary result)   Collection Time: 10/16/22  9:43 PM   Specimen: BLOOD LEFT FOREARM  Result Value Ref Range Status   Specimen Description BLOOD LEFT FOREARM  Final   Special Requests   Final    IN PEDIATRIC BOTTLE Blood Culture results may not be optimal due to an inadequate volume of blood received in culture bottles   Culture   Final    NO GROWTH < 12 HOURS Performed at [facility], [place] [address] [place],  [address]    Report Status PENDING  Incomplete         Radiology Studies: DG CHEST PORT 1 VIEW  Result Date: 10/17/2022 CLINICAL DATA:  Fever. EXAM: PORTABLE CHEST 1 VIEW COMPARISON:  02/26/2021 FINDINGS: The lungs are clear without focal pneumonia, edema, pneumothorax or pleural effusion. The cardiopericardial silhouette is within normal limits for size. The visualized bony structures of the thorax are unremarkable. IMPRESSION: No acute cardiopulmonary findings. Electronically Signed   By: Misty Stanley M.D.   On: 10/17/2022 10:58        Scheduled Meds:  aspirin  325 mg Oral Daily   vitamin B-12  1,000 mcg Oral Daily   docusate sodium  100 mg Oral BID   enoxaparin (LOVENOX) injection  40 mg Subcutaneous Q24H   gabapentin  300 mg Oral TID   insulin aspart  0-5 Units Subcutaneous QHS   insulin aspart  0-9 Units Subcutaneous TID WC   irbesartan  300 mg Oral Daily   levothyroxine  150 mcg Oral QAC breakfast   lidocaine  1  patch Transdermal Q24H   mupirocin ointment  1 Application Nasal BID   nicotine  21 mg Transdermal Daily   rosuvastatin  40 mg Oral Daily   sodium chloride flush  3 mL Intravenous Once   sodium chloride flush  3 mL Intravenous Q12H   Continuous Infusions:  sodium chloride  sodium chloride Stopped (10/17/22 0300)   lactated ringers       LOS: 3 days    Time spent: 35 minutes.     Elmarie Shiley, MD [facility]   If 7PM-7AM, please contact night-coverage [email or web address]  10/17/2022, 1:01 PM

## 2022-10-17 NOTE — Progress Notes (Signed)
Mobility Specialist Progress Note:   10/17/22 0942  Mobility  Activity Ambulated with assistance in hallway  Level of Assistance Contact guard assist, steadying assist  Assistive Device Front wheel walker  Distance Ambulated (ft) 250 ft  Activity Response Tolerated well  Mobility Referral Yes  $Mobility charge 1 Mobility   Pt received in bed and agreeable. Pt c/o of feeling unsteady and had 1x LOB which was self-corrected. Pt left in chair with all needs met, chair alarm on, and NT in room.   Yusef Lamp Mobility Specialist-Acute Rehab Secure Chat only

## 2022-10-18 ENCOUNTER — Inpatient Hospital Stay (HOSPITAL_COMMUNITY): Payer: Medicare HMO

## 2022-10-18 DIAGNOSIS — R200 Anesthesia of skin: Secondary | ICD-10-CM | POA: Diagnosis not present

## 2022-10-18 LAB — GLUCOSE, CAPILLARY
Glucose-Capillary: 114 mg/dL — ABNORMAL HIGH (ref 70–99)
Glucose-Capillary: 117 mg/dL — ABNORMAL HIGH (ref 70–99)
Glucose-Capillary: 126 mg/dL — ABNORMAL HIGH (ref 70–99)
Glucose-Capillary: 113 mg/dL — ABNORMAL HIGH (ref 70–99)

## 2022-10-18 MED ORDER — DEXAMETHASONE 4 MG PO TABS
4.0000 mg | ORAL_TABLET | Freq: Three times a day (TID) | ORAL | Status: DC
  Administered 2022-10-18 – 2022-10-19 (×4): 4 mg via ORAL
  Filled 2022-10-18 (×4): qty 1

## 2022-10-18 NOTE — Progress Notes (Signed)
Mobility Specialist Progress Note   10/18/22 1503  Mobility  Activity Off unit   Attempt x2. Patient still at swallow study. Will f/u as time permits.  [place] Brandilee Pies, [place], [place]  Office: [phone]

## 2022-10-18 NOTE — Progress Notes (Addendum)
PROGRESS NOTE    Pamela Cherry  UJW:[id number] DOB: January 09, 1951 DOA: 10/12/2022 PCP: Susy Frizzle, MD   Brief Narrative: 71 year old with past medical history significant for diabetes type 2, hypertension, hyperlipidemia, hypothyroidism, status post right BKA, tobacco use, chronic numbness of the left fourth and fifth finger who presented to the ED for evaluation of new numbness and weakness on her left upper and lower extremities.  Over the last 1 or 2 weeks he has noted intermittent numbness involving the whole left hand.  The day of admission around 1530 she developed new numbness involving her left arm.  Subsequently developed left leg and left face numbness.  No associated focal weakness, change in speech or change in vision. CT head without contrast was negative for acute infarct.  Mild white matter hypodensity in the frontal lobe bilaterally seen most likely due to chronic microvascular ischemia.  Patient has been admitted for further CVA work-up.  Assessment & Plan:   Principal Problem:   Left sided numbness Active Problems:   Hypertension associated with diabetes ([place])   Type 2 diabetes mellitus without complication (HCC)   Acquired hypothyroidism   Hyperlipidemia associated with type 2 diabetes mellitus (HCC)   Tobacco use  1-Left side  numbness: left arm and leg. Secondary to Cervical Spondylitic Myelopathy.  -MRI: No acute intracranial abnormality. Empty sella. -B 12: 183. Started  supplement.  -CTA: Negative for CTA large vessel occlusion.  Moderate atheromatous changes in the right carotid bulb/proximal right ICA with associated stenosis up to 65%.--Neurology recommendation -Cervical MRI showed:  Left paracentral disc osteophyte complex at C4-5 with resultant severe spinal stenosis, with severe bilateral C5 foraminal narrowing. Associated cord flattening with chronic myelomalacia involving the left greater than right cord at this level.. Right paracentral disc  osteophyte complex at C5-6 with resultant moderate spinal stenosis. Left-sided uncovertebral spurring with resultant moderate left C6 foraminal stenosis. Small left paracentral disc protrusion at C2-3 with resultant mild spinal stenosis. Small central disc protrusion at C3-4 with resultant mild spinal stenosis. -Neurosurgery consulted, underwent  C4-C5, C5-C6 Placement of intervertebral biomechanical device by Dr Marcello Moores.  Doing well post sx. Per neuro sx ok to resume aspirin on 11/05 and DVT prophylaxis 11/04 -ok to discharge per neurosurgery   2-Dysphagia;  Report difficulty swallowing since surgery, worse overnight.  NPO IV fluids.  Speech consulted.  Message send to neurosurgery  Discussed with Dr Marcello Moores, he recommend dexamethasone 4 mg TID--taper over 10 days.    Hypertension associated with diabetes: Continue with Olmersartan.   -Diabetes type 2: SSI.  Hold metformin and glipizide while inpatient  -Tobacco  use: Nicotine patch.  Counseling.   -Hyperlipidemia Continue with Crestor.  Acquired hypothyroidism TSH at  10, increased Synthroid to 150 mcg  Fever;  Low grade fever last night.  Incentive spirometry.  Chest x ray negative.  UA negative  Suspect atelectasis  Resolved.   Right shoulder pain; X ray negative.  MRI: Mild to moderate mid anterior infraspinatus tendinosis with focal partial thickness tear of the measure only 4 mm in AP dimension but involve the majority of the transverse dimension of the tendon and extending through the bursal tendon surface.  Longitudinal instrumentation tear with the deep aspect of the mid AP dimension of the infraspinatus muscle measuring up to 15 mm in craniocaudal dimension.  Long head of the biceps tendinosis mild to moderate, mild degenerative changes of the acromion clavicular joint. She is not having significant pain today, and is able to raise left arm  Discussed with Ortho, plan for OT, pain management, likely frozen  shoulder.   Estimated body mass index is 35.72 kg/m as calculated from the following:   Height as of this encounter: '5\' 4"'$  (1.626 m).   Weight as of this encounter: 94.4 kg.   DVT prophylaxis: Lovenox Code Status: Full code Family Communication: care discussed with patient.  Disposition Plan:  Status is: Observation The patient will require care spanning > 2 midnights and should be moved to inpatient because: cervical myelopathy requiring Sx, remain overnight to monitor for fever.     Consultants:  Neurology   Procedures:  ECHO   Antimicrobials:    Subjective: She is having difficulty swallowing since sx, worse yesterday. Shock on food.   Objective: Vitals:   10/17/22 2342 10/18/22 0333 10/18/22 0821 10/18/22 1102  BP: (!) 145/54 (!) 153/54 (!) 143/59 127/73  Pulse: 89 88 86 66  Resp: '16 16 16 16  '$ Temp: 98.2 F (36.8 C) 98.1 F (36.7 C) 98 F (36.7 C) 98.2 F (36.8 C)  TempSrc: Oral Oral Oral Oral  SpO2: 95% 90% 95% 96%  Weight:      Height:        Intake/Output Summary (Last 24 hours) at 10/18/2022 1408 Last data filed at 10/18/2022 0334 Gross per 24 hour  Intake --  Output 550 ml  Net -550 ml    Filed Weights   10/12/22 1700 10/12/22 1757  Weight: 94.4 kg 94.4 kg    Examination:  General exam:  NAD Respiratory system: CTA Cardiovascular system: SS 1 ,S 2 RRR Gastrointestinal system: BS present, soft nt Central nervous system: alert, follows command Extremities: no edema   Data Reviewed: I have personally reviewed following labs and imaging studies  CBC: Recent Labs  Lab 10/12/22 1727 10/12/22 1732 10/13/22 0307 10/15/22 0912 10/16/22 0809 10/17/22 0742  WBC 7.8  --  5.6 9.2 9.3 7.2  NEUTROABS 5.7  --   --   --   --   --   HGB 11.3* 12.6 10.0* 10.4* 9.0* 8.4*  HCT 36.8 37.0 32.8* 35.5* 30.5* 28.1*  MCV 78.5*  --  79.2* 80.3 79.6* 80.1  PLT 236  --  228 258 208 967    Basic Metabolic Panel: Recent Labs  Lab 10/12/22 1727  10/12/22 1732 10/13/22 0307 10/15/22 0912 10/16/22 0809 10/17/22 0742  NA 141 138 139 138 138 137  K 4.3 4.2 3.6 4.6 3.7 3.6  CL 104 103 105 107 106 104  CO2 24  --  '23 23 26 24  '$ GLUCOSE 198* 194* 131* 186* 177* 147*  BUN '12 13 12 11 16 12  '$ CREATININE 0.78 0.60 0.68 0.66 0.79 0.73  CALCIUM 9.4  --  8.9 8.6* 8.1* 8.3*    GFR: Estimated Creatinine Clearance: 70.8 mL/min (by C-G formula based on SCr of 0.73 mg/dL). Liver Function Tests: Recent Labs  Lab 10/12/22 1727  AST 30  ALT 13  ALKPHOS 84  BILITOT 0.7  PROT 6.6  ALBUMIN 3.5    No results for input(s): "LIPASE", "AMYLASE" in the last 168 hours. No results for input(s): "AMMONIA" in the last 168 hours. Coagulation Profile: Recent Labs  Lab 10/12/22 1727  INR 1.1    Cardiac Enzymes: No results for input(s): "CKTOTAL", "CKMB", "CKMBINDEX", "TROPONINI" in the last 168 hours. BNP (last 3 results) No results for input(s): "PROBNP" in the last 8760 hours. HbA1C: No results for input(s): "HGBA1C" in the last 72 hours.  CBG: Recent Labs  Lab  10/17/22 1537 10/17/22 2147 10/18/22 0623 10/18/22 0820 10/18/22 1106  GLUCAP 156* 182* 114* 117* 126*    Lipid Profile: No results for input(s): "CHOL", "HDL", "LDLCALC", "TRIG", "CHOLHDL", "LDLDIRECT" in the last 72 hours.  Thyroid Function Tests: No results for input(s): "TSH", "T4TOTAL", "FREET4", "T3FREE", "THYROIDAB" in the last 72 hours.  Anemia Panel: No results for input(s): "VITAMINB12", "FOLATE", "FERRITIN", "TIBC", "IRON", "RETICCTPCT" in the last 72 hours.  Sepsis Labs: No results for input(s): "PROCALCITON", "LATICACIDVEN" in the last 168 hours.  Recent Results (from the past 240 hour(s))  Surgical PCR screen     Status: None   Collection Time: 10/14/22  3:28 PM   Specimen: Nasal Mucosa; Nasal Swab  Result Value Ref Range Status   MRSA, PCR NEGATIVE NEGATIVE Final   Staphylococcus aureus NEGATIVE NEGATIVE Final    Comment: (NOTE) The Xpert SA  Assay (FDA approved for NASAL specimens in patients 89 years of age and older), is one component of a comprehensive surveillance program. It is not intended to diagnose infection nor to guide or monitor treatment. Performed at [facility], [place] [address] [place], [place] [address]   Culture, blood (Routine X 2) w Reflex to ID Panel     Status: None (Preliminary result)   Collection Time: 10/16/22  9:43 PM   Specimen: BLOOD LEFT HAND  Result Value Ref Range Status   Specimen Description BLOOD LEFT HAND  Final   Special Requests   Final    BOTTLES DRAWN AEROBIC AND ANAEROBIC Blood Culture adequate volume   Culture   Final    NO GROWTH 2 DAYS Performed at [facility], [place] [address], [place], [place] [address]    Report Status PENDING  Incomplete  Culture, blood (Routine X 2) w Reflex to ID Panel     Status: None (Preliminary result)   Collection Time: 10/16/22  9:43 PM   Specimen: BLOOD LEFT FOREARM  Result Value Ref Range Status   Specimen Description BLOOD LEFT FOREARM  Final   Special Requests   Final    IN PEDIATRIC BOTTLE Blood Culture results may not be optimal due to an inadequate volume of blood received in culture bottles   Culture   Final    NO GROWTH 2 DAYS Performed at [facility], [place] [address] [place], [place] [address]    Report Status PENDING  Incomplete         Radiology Studies: DG CHEST PORT 1 VIEW  Result Date: 10/17/2022 CLINICAL DATA:  Fever. EXAM: PORTABLE CHEST 1 VIEW COMPARISON:  02/26/2021 FINDINGS: The lungs are clear without focal pneumonia, edema, pneumothorax or pleural effusion. The cardiopericardial silhouette is within normal limits for size. The visualized bony structures of the thorax are unremarkable. IMPRESSION: No acute cardiopulmonary findings. Electronically Signed   By: Misty Stanley M.D.   On: 10/17/2022 10:58        Scheduled Meds:  aspirin  325 mg Oral Daily   vitamin B-12  1,000 mcg Oral Daily    docusate sodium  100 mg Oral BID   enoxaparin (LOVENOX) injection  40 mg Subcutaneous Q24H   gabapentin  300 mg Oral TID   insulin aspart  0-5 Units Subcutaneous QHS   insulin aspart  0-9 Units Subcutaneous TID WC   irbesartan  300 mg Oral Daily   levothyroxine  150 mcg Oral QAC breakfast   lidocaine  1 patch Transdermal Q24H   mupirocin ointment  1 Application Nasal BID  nicotine  21 mg Transdermal Daily   rosuvastatin  40 mg Oral Daily   sodium chloride flush  3 mL Intravenous Once   sodium chloride flush  3 mL Intravenous Q12H   Continuous Infusions:  sodium chloride     sodium chloride Stopped (10/17/22 0300)   lactated ringers       LOS: 4 days    Time spent: 35 minutes.     Elmarie Shiley, MD [facility]   If 7PM-7AM, please contact night-coverage [email or web address]  10/18/2022, 2:08 PM
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
## 2022-10-18 NOTE — Progress Notes (Signed)
OT Cancellation Note  Patient Details Name: Pamela Cherry MRN: [id number] DOB: December 27, 1950   Cancelled Treatment:    Reason Eval/Treat Not Completed: Patient at procedure or test/ unavailable. Pt out of room for swallow study.  [place], OTR/L [facility] [phone] Office [phone]    Almon Register 10/18/2022, 12:08 PM

## 2022-10-18 NOTE — Care Management Important Message (Signed)
Important Message  Patient Details  Name: Pamela Cherry MRN: [id number] Date of Birth: 09/28/1951   Medicare Important Message Given:  Yes     [name] Montine Circle 10/18/2022, 4:07 PM

## 2022-10-18 NOTE — Progress Notes (Signed)
PT Cancellation Note  Patient Details Name: Pamela Cherry MRN: [id number] DOB: 07/28/1951   Cancelled Treatment:    Reason Eval/Treat Not Completed: Patient at procedure or test/unavailable (pt off unit for swallow study. will follow up at later date/time as schedule allows.)  [place], [place] Office [phone]  10/18/22 2:44 PM

## 2022-10-18 NOTE — Progress Notes (Signed)
Modified Barium Swallow Progress Note  Patient Details  Name: Pamela Cherry MRN: [id number] Date of Birth: [birth year]  Today's Date: 10/18/2022  Modified Barium Swallow completed.  Full report located under Chart Review in the Imaging Section.  Brief recommendations include the following:  Clinical Impression  Pt presents with pharyngeal dysphagia characterized by reduction in hyolaryngeal elevation, anterior laryngeal movement, and pharyngeal constriction. She demonstrated base of tongue residue, vallecular residue, pyriform sinus residue, and posterior pharyngeal wall residue. Residue was most significant with solids and was improved with a liquid wash and secondary swallows. Moderate to significant pharyngeal wall edema was noted, likely secondary to ACDF, which appeared to impact pharyngeal clearance. Pt exhibited coughing with mild to moderate solid vallecular residue. This was also noted when vallecular residue of thin liquids spilled over to the laryngeal surface of the epiglottis prior to her initiating a second swallow to clear it. Penetration progressed to PAS 3 with thin liquids via straw, but no instances of aspiration were noted with any solids or liquids. Pt's pharyngeal edema and performance were discussed with Dr. Tyrell Antonio who advised that dexamethasone has been initiated. A full liquid diet will be initiated at this time. SLP will continue to follow pt and advance pt's diet as clinically indicated.   Swallow Evaluation Recommendations       SLP Diet Recommendations: Thin liquid (Full liquids)   Liquid Administration via: Cup;No straw   Medication Administration: Whole meds with liquid   Supervision: Patient able to self feed;Intermittent supervision to cue for compensatory strategies   Compensations: Slow rate;Small sips/bites   Postural Changes: Seated upright at 90 degrees          Jahvier Aldea I. Hardin Negus, [place], [place] Office number  [place] 10/18/2022,3:45 PM

## 2022-10-18 NOTE — Progress Notes (Signed)
Pt having more difficulty swallowing tonight even liquids. Swallowing causes increased pain and irritation in her throat she says

## 2022-10-18 NOTE — Plan of Care (Signed)
Pt having more difficulty swallowing tonight. Painful to swallow even liquid. Pt up and about in rm with RLE prosthetic and walker.

## 2022-10-19 DIAGNOSIS — R200 Anesthesia of skin: Secondary | ICD-10-CM | POA: Diagnosis not present

## 2022-10-19 LAB — GLUCOSE, CAPILLARY
Glucose-Capillary: 242 mg/dL — ABNORMAL HIGH (ref 70–99)
Glucose-Capillary: 238 mg/dL — ABNORMAL HIGH (ref 70–99)
Glucose-Capillary: 201 mg/dL — ABNORMAL HIGH (ref 70–99)
Glucose-Capillary: 312 mg/dL — ABNORMAL HIGH (ref 70–99)
Glucose-Capillary: 227 mg/dL — ABNORMAL HIGH (ref 70–99)

## 2022-10-19 MED ORDER — POLYETHYLENE GLYCOL 3350 17 G PO PACK: 17.0000 g | PACK | Freq: Every day | ORAL | 0 refills | Status: DC | PRN

## 2022-10-19 MED ORDER — PANTOPRAZOLE SODIUM 40 MG PO TBEC: 40.0000 mg | DELAYED_RELEASE_TABLET | Freq: Every day | ORAL | 1 refills | Status: AC

## 2022-10-19 MED ORDER — DOCUSATE SODIUM 100 MG PO CAPS: 100.0000 mg | ORAL_CAPSULE | Freq: Two times a day (BID) | ORAL | 0 refills | Status: DC

## 2022-10-19 MED ORDER — PIOGLITAZONE HCL 30 MG PO TABS
30.0000 mg | ORAL_TABLET | Freq: Every day | ORAL | Status: DC
  Administered 2022-10-19: 30 mg via ORAL
  Filled 2022-10-19: qty 1

## 2022-10-19 MED ORDER — OXYCODONE HCL 5 MG PO TABS: 5.0000 mg | ORAL_TABLET | Freq: Four times a day (QID) | ORAL | 0 refills | Status: AC | PRN

## 2022-10-19 MED ORDER — DEXAMETHASONE 4 MG PO TABS: ORAL_TABLET | ORAL | 0 refills | Status: DC

## 2022-10-19 MED ORDER — LEVOTHYROXINE SODIUM 150 MCG PO TABS: 150.0000 ug | ORAL_TABLET | Freq: Every day | ORAL | 1 refills | Status: DC

## 2022-10-19 MED ORDER — GLIPIZIDE ER 10 MG PO TB24
10.0000 mg | ORAL_TABLET | Freq: Every day | ORAL | Status: DC
  Administered 2022-10-19: 10 mg via ORAL
  Filled 2022-10-19 (×2): qty 1

## 2022-10-19 MED ORDER — CYANOCOBALAMIN 1000 MCG PO TABS: 1000.0000 ug | ORAL_TABLET | Freq: Every day | ORAL | 0 refills | Status: AC

## 2022-10-19 NOTE — Progress Notes (Signed)
Mobility Specialist: Progress Note   10/19/22 1540  Mobility  Activity Ambulated with assistance in hallway  Level of Assistance Contact guard assist, steadying assist  Assistive Device Front wheel walker  Distance Ambulated (ft) 250 ft  Activity Response Tolerated well  Mobility Referral Yes  $Mobility charge 1 Mobility   Pt received coming out of the BR with RN present in the room and agreeable to mobility. No c/o throughout. Pt back to bed after session with call bell and phone in reach.   [place] Cyara Devoto Mobility Specialist Secure Chat Only

## 2022-10-19 NOTE — Discharge Summary (Signed)
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 left ventricular hypertrophy. Left ventricular diastolic parameters are consistent with Grade I diastolic dysfunction (impaired relaxation).  2. Right ventricular systolic function is normal. The right ventricular size is normal. Tricuspid regurgitation signal is inadequate for assessing PA pressure.  3. The mitral valve is normal in structure. No evidence of mitral valve regurgitation. No evidence of mitral stenosis. Moderate mitral annular calcification.  4. The aortic valve is tricuspid. There is mild calcification of the aortic valve. Aortic valve regurgitation is not visualized. No aortic stenosis is present.  5. The inferior vena cava is normal in size with greater than 50% respiratory variability, suggesting right atrial pressure of 3 mmHg. FINDINGS  Left Ventricle: Left ventricular ejection fraction, by estimation, is 60 to 65%. The left ventricle has normal function. The left ventricle has no regional wall motion abnormalities. Definity contrast agent was given IV to delineate the left ventricular  endocardial borders. The left  ventricular internal cavity size was normal in size. There is moderate concentric left ventricular hypertrophy. Left ventricular diastolic parameters are consistent with Grade I diastolic dysfunction (impaired relaxation). Right Ventricle: The right ventricular size is normal. No increase in right ventricular wall thickness. Right ventricular systolic function is normal. Tricuspid regurgitation signal is inadequate for assessing PA pressure. Left Atrium: Left atrial size was normal in size. Right Atrium: Right atrial size was normal in size. Pericardium: Trivial pericardial effusion is present. Mitral Valve: The mitral valve is normal in structure. There is mild calcification of the mitral valve leaflet(s). Moderate mitral annular calcification. No evidence of mitral valve regurgitation. No evidence of mitral valve stenosis. MV peak gradient, 6.5 mmHg. The mean mitral valve gradient is 3.0 mmHg. Tricuspid Valve: The tricuspid valve  is normal in structure. Tricuspid valve regurgitation is trivial. Aortic Valve: The aortic valve is tricuspid. There is mild calcification of the aortic valve. Aortic valve regurgitation is not visualized. No aortic stenosis is present. Pulmonic Valve: The pulmonic valve was normal in structure. Pulmonic valve regurgitation is not visualized. Aorta: The aortic root is normal in size and structure. Venous: The inferior vena cava is normal in size with greater than 50% respiratory variability, suggesting right atrial pressure of 3 mmHg. IAS/Shunts: No atrial level shunt detected by color flow Doppler.  LEFT VENTRICLE PLAX 2D LVIDd:         4.00 cm   Diastology LVIDs:         2.70 cm   LV e' medial:    5.43 cm/s LV PW:         1.10 cm   LV E/e' medial:  16.0 LV IVS:        1.00 cm   LV e' lateral:   6.22 cm/s LVOT diam:     2.00 cm   LV E/e' lateral: 14.0 LV SV:         84 LV SV Index:   42 LVOT Area:     3.14 cm  RIGHT VENTRICLE             IVC RV S prime:     15.50 cm/s  IVC diam: 1.50 cm  TAPSE (M-mode): 2.3 cm LEFT ATRIUM           Index        RIGHT ATRIUM           Index LA diam:      3.60 cm 1.81 cm/m   RA Area:     10.90 cm LA Vol (A4C): 76.8 ml 38.60 ml/m  RA Volume:   19.90 ml  10.00 ml/m  AORTIC VALVE LVOT Vmax:   117.00 cm/s LVOT Vmean:  82.600 cm/s LVOT VTI:    0.268 m  AORTA Ao Root diam: 3.20 cm Ao Asc diam:  3.20 cm MITRAL VALVE MV Area (PHT): 2.13 cm     SHUNTS MV Area VTI:   2.45 cm     Systemic VTI:  0.27 m MV Peak grad:  6.5 mmHg     Systemic Diam: 2.00 cm MV Mean grad:  3.0 mmHg MV Vmax:       1.27 m/s MV Vmean:      77.0 cm/s MV Decel Time: 356 msec MV E velocity: 86.90 cm/s MV A velocity: 119.00 cm/s MV E/A ratio:  0.73 Dalton McleanMD Electronically signed by Franki Monte Signature Date/Time: 10/13/2022/3:34:00 PM    Final    DG Shoulder Left  Result Date: 10/13/2022 CLINICAL DATA:  Shoulder pain EXAM: LEFT SHOULDER - 2+ VIEW COMPARISON:  None Available. FINDINGS: No evidence of acute fracture. Alignment is normal. 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 IMPRESSION: No acute fracture or dislocation. Mild glenohumeral and AC joint osteoarthritis. Electronically Signed   By: Maurine Simmering M.D.   On: 10/13/2022 09:27   MR BRAIN WO CONTRAST  Result Date: 10/12/2022 CLINICAL DATA:  Initial evaluation for neuro deficit, stroke suspected. EXAM: MRI HEAD WITHOUT CONTRAST TECHNIQUE: Multiplanar, multiecho pulse sequences of the brain and surrounding structures were obtained without intravenous contrast. COMPARISON:  CTs from earlier the same day. FINDINGS: Brain: Cerebral volume within normal limits. Patchy T2/FLAIR hyperintensity involving the supratentorial cerebral white matter, nonspecific, but most commonly related to chronic microvascular ischemic disease, mild in nature. No evidence  for acute or subacute ischemia. Gray-white matter differentiation maintained. No areas of chronic cortical infarction. No acute or chronic intracranial blood products. No  mass lesion, midline shift or mass effect. No hydrocephalus or extra-axial fluid collection. Empty sella noted. Vascular: Major intracranial vascular flow voids are maintained. Skull and upper cervical spine: Craniocervical junction normal. Bone marrow signal intensity normal. No scalp soft tissue abnormality. Sinuses/Orbits: Globes and orbital soft tissues within normal limits. Paranasal sinuses are largely clear. No significant mastoid effusion. Other: None. IMPRESSION: 1. No acute intracranial abnormality. 2. Patchy T2/FLAIR hyperintensity involving the supratentorial cerebral white matter, nonspecific, but most commonly related to chronic microvascular ischemic disease, mild in nature. 3. Empty sella. Electronically Signed   By: Jeannine Boga M.D.   On: 10/12/2022 23:05   CT ANGIO HEAD NECK W WO CM  Result Date: 10/12/2022 CLINICAL DATA:  Initial evaluation for neuro deficit, stroke suspected. EXAM: CT ANGIOGRAPHY HEAD AND NECK TECHNIQUE: Multidetector CT imaging of the head and neck was performed using the standard protocol during bolus administration of intravenous contrast. Multiplanar CT image reconstructions and MIPs were obtained to evaluate the vascular anatomy. Carotid stenosis measurements (when applicable) are obtained utilizing NASCET criteria, using the distal internal carotid diameter as the denominator. RADIATION DOSE REDUCTION: This exam was performed according to the departmental dose-optimization program which includes automated exposure control, adjustment of the mA and/or kV according to patient size and/or use of iterative reconstruction technique. CONTRAST:  43m OMNIPAQUE IOHEXOL 350 MG/ML SOLN COMPARISON:  Prior CT from earlier the same day. FINDINGS: CT HEAD FINDINGS Brain: Cerebral volume within normal limits. 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 No acute intracranial hemorrhage. No acute large vessel territory infarct.  No mass lesion or midline shift. No hydrocephalus or extra-axial fluid collection. Empty sella noted. Vascular: No abnormal hyperdense vessel. Calcified atherosclerosis present at skull base. Skull: Scalp soft tissues and calvarium demonstrate no acute finding. Sinuses/Orbits: Globes orbital soft tissues within normal limits. Few subcentimeter osteoma is noted at the left ethmoidal air cells. Paranasal sinuses are largely clear. No significant mastoid effusion. Other: None. Review of the MIP images confirms the above findings CTA NECK FINDINGS Aortic arch: Visualized aortic arch normal caliber with standard 3 vessel morphology. Moderate atheromatous change about the arch and origin of the great vessels without hemodynamically significant stenosis. Right carotid system: Right common and internal carotid arteries patent without dissection or occlusion. Moderate atheromatous change about the right carotid bulb/proximal right ICA with associated stenosis of up to 65% by NASCET criteria. Left carotid system: Left common and internal carotid arteries patent without dissection or occlusion. Moderate atheromatous change about the left carotid bulb/proximal cervical left ICA but without hemodynamically significant greater than 50% stenosis. Vertebral arteries: Both vertebral arteries arise from the subclavian arteries. No proximal subclavian artery stenosis. No dissection or occlusion. Scattered atheromatous change within both vertebral arteries with focal moderate left V3 stenosis (series 11, image 176). No other hemodynamically significant stenosis. Skeleton: No discrete or worrisome osseous lesions. Mild-to-moderate spondylosis present at C4-5 through C6-7. Patient is edentulous. Other neck: No other acute soft tissue abnormality within the neck. Heterogeneous multinodular right lobe of thyroid, indeterminate. Upper chest: Visualized upper chest demonstrates no acute finding. Review of the MIP images confirms the above  findings CTA HEAD FINDINGS Anterior circulation: Atheromatous change seen within the carotid siphons with associated mild to moderate multifocal narrowing. A1 segments patent bilaterally. Normal anterior communicating artery complex. Anterior cerebral arteries patent without stenosis.  No M1 stenosis or occlusion. No proximal MCA branch occlusion or high-grade stenosis. Distal MCA branches perfused and symmetric. Posterior circulation: Scattered atheromatous change within the V4 segments bilaterally without significant stenosis. Both PICA patent. Basilar widely patent to its distal aspect without stenosis. Superior cerebellar and posterior cerebral arteries patent bilaterally. Venous sinuses: Patent allowing for timing the contrast bolus. Anatomic variants: None significant.  No aneurysm. Review of the MIP images confirms the above findings IMPRESSION: CT HEAD IMPRESSION: 1. No acute intracranial abnormality. 2. 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 3. Empty sella. CTA HEAD AND NECK: 1. Negative CTA for large vessel occlusion or other emergent finding. 2. Moderate atheromatous change about the right carotid bulb/proximal right ICA with associated stenosis of up to 65% by NASCET criteria. 3. Atheromatous change within the carotid siphons with associated mild to moderate multifocal narrowing. 4. Focal moderate left V3 stenosis. 5. 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 Further evaluation with dedicated thyroid ultrasound recommended. This could be performed on a nonemergent outpatient basis. (Ref: J Am Coll Radiol. 2015 Feb;12(2): 143-50). Aortic Atherosclerosis (ICD10-I70.0). Electronically Signed   By: Jeannine Boga M.D.   On: 10/12/2022 21:53   CT HEAD CODE STROKE WO CONTRAST  Result Date: 10/12/2022 CLINICAL DATA:  Code stroke. Acute neuro deficit. Left-sided weakness EXAM: CT HEAD WITHOUT CONTRAST TECHNIQUE:  Contiguous axial images were obtained from the base of the skull through the vertex without intravenous contrast. RADIATION DOSE REDUCTION: This exam was performed according to the departmental dose-optimization program which includes automated exposure control, adjustment of the mA and/or kV according to patient size and/or use of iterative reconstruction technique. COMPARISON:  None Available. FINDINGS: Brain: No evidence of acute infarction, hemorrhage, hydrocephalus, extra-axial collection or mass lesion/mass effect. Mild white matter hypodensity in the frontal lobes bilaterally. Enlarged sella filled with CSF. Small pituitary. Findings compatible with empty sella. Vascular: Negative for hyperdense vessel Skull: Negative Sinuses/Orbits: Mild mucosal edema maxillary sinus bilaterally. Small osteoma left ethmoid sinus. Negative orbit Other: None ASPECTS ([place] Stroke Program Early CT Score) - Ganglionic level infarction (caudate, lentiform nuclei, internal capsule, insula, M1-M3 cortex): 7 - Supraganglionic infarction (M4-M6 cortex): 3 Total score (0-10 with 10 being normal): 10 IMPRESSION: 1. Negative for acute infarct. Mild white matter hypodensity in the frontal lobes bilaterally most likely due to chronic microvascular ischemia. 2. Aspects is 10. 3. Code stroke imaging results were communicated on 10/12/2022 at 5:42 pm to provider [place] via secure text page Electronically Signed   By: Franchot Gallo M.D.   On: 10/12/2022 17:42   US Abdomen Limited RUQ (LIVER/GB)  Result Date: 10/03/2022 CLINICAL DATA:  RIGHT upper quadrant pain EXAM: ULTRASOUND ABDOMEN LIMITED RIGHT UPPER QUADRANT COMPARISON:  None Available. FINDINGS: Gallbladder: Multiple gallstones. Largest stone measures 1.4 cm. No gallbladder wall thickening or pericholecystic fluid. No sonographic Murphy's sign elicited during the exam. Common bile duct: Diameter: 3 mm Liver: Within normal limits in parenchymal echogenicity. No acute or  suspicious findings within the liver. Portal vein is patent on color Doppler imaging with normal direction of blood flow towards the liver. Other: None. IMPRESSION: 1. No acute findings. Cholelithiasis without evidence of acute cholecystitis. 2. Liver is unremarkable. Electronically Signed   By: Franki Cabot M.D.   On: 10/03/2022 17:38    Microbiology: Results for orders placed or performed during the hospital encounter of 10/12/22  Surgical PCR screen     Status: None   Collection Time: 10/14/22  3:28 PM   Specimen: Nasal Mucosa; Nasal Swab  Result Value Ref Range Status   MRSA, PCR NEGATIVE NEGATIVE Final   Staphylococcus aureus NEGATIVE NEGATIVE Final    Comment: (NOTE) The Xpert SA Assay (FDA approved for NASAL specimens in patients 60 years of age and older), is one component of a comprehensive surveillance program. It is not intended to diagnose infection nor to guide or monitor treatment. Performed at [facility], [place] [address], [place], [place] [address]   Culture, blood (Routine X 2) w Reflex to ID Panel     Status: None (Preliminary result)   Collection Time: 10/16/22  9:43 PM   Specimen: BLOOD LEFT HAND  Result Value Ref Range Status   Specimen Description BLOOD LEFT HAND  Final   Special Requests   Final    BOTTLES DRAWN AEROBIC AND ANAEROBIC Blood Culture adequate volume   Culture   Final    NO GROWTH 3 DAYS Performed at [facility], [place] [address] [place], [place] [address]    Report Status PENDING  Incomplete  Culture, blood (Routine X 2) w Reflex to ID Panel     Status: None (Preliminary result)   Collection Time: 10/16/22  9:43 PM   Specimen: BLOOD LEFT FOREARM  Result Value Ref Range Status   Specimen Description BLOOD LEFT FOREARM  Final   Special Requests   Final    IN PEDIATRIC BOTTLE Blood Culture results may not be optimal due to an inadequate volume of blood received in culture bottles   Culture   Final    NO GROWTH 3 DAYS Performed at  [facility], [place] [address], [place], [place] [address]    Report Status PENDING  Incomplete    Labs: CBC: Recent Labs  Lab 10/12/22 1727 10/12/22 1732 10/13/22 0307 10/15/22 0912 10/16/22 0809 10/17/22 0742  WBC 7.8  --  5.6 9.2 9.3 7.2  NEUTROABS 5.7  --   --   --   --   --   HGB 11.3* 12.6 10.0* 10.4* 9.0* 8.4*  HCT 36.8 37.0 32.8* 35.5* 30.5* 28.1*  MCV 78.5*  --  79.2* 80.3 79.6* 80.1  PLT 236  --  228 258 208 716   Basic Metabolic Panel: Recent Labs  Lab 10/12/22 1727 10/12/22 1732 10/13/22 0307 10/15/22 0912 10/16/22 0809 10/17/22 0742  NA 141 138 139 138 138 137  K 4.3 4.2 3.6 4.6 3.7 3.6  CL 104 103 105 107 106 104  CO2 24  --  '23 23 26 24  '$ GLUCOSE 198* 194* 131* 186* 177* 147*  BUN '12 13 12 11 16 12  '$ CREATININE 0.78 0.60 0.68 0.66 0.79 0.73  CALCIUM 9.4  --  8.9 8.6* 8.1* 8.3*   Liver Function Tests: Recent Labs  Lab 10/12/22 1727  AST 30  ALT 13  ALKPHOS 84  BILITOT 0.7  PROT 6.6  ALBUMIN 3.5   CBG: Recent Labs  Lab 10/18/22 1515 10/18/22 2016 10/18/22 2223 10/19/22 0611 10/19/22 1216  GLUCAP 113* 242* 238* 201* 312*    Discharge time spent: greater than 30 minutes.  Signed: Elmarie Shiley, MD [facility] 10/19/2022

## 2022-10-19 NOTE — Plan of Care (Signed)
  Problem: Education: Goal: Ability to describe self-care measures that may prevent or decrease complications (Diabetes Survival Skills Education) will improve Outcome: Progressing Goal: Individualized Educational Video(s) Outcome: Progressing   Problem: Coping: Goal: Ability to adjust to condition or change in health will improve Outcome: Progressing   Problem: Fluid Volume: Goal: Ability to maintain a balanced intake and output will improve Outcome: Progressing   Problem: Health Behavior/Discharge Planning: Goal: Ability to identify and utilize available resources and services will improve Outcome: Progressing Goal: Ability to manage health-related needs will improve Outcome: Progressing   Problem: Metabolic: Goal: Ability to maintain appropriate glucose levels will improve Outcome: Progressing   Problem: Nutritional: Goal: Maintenance of adequate nutrition will improve Outcome: Progressing Goal: Progress toward achieving an optimal weight will improve Outcome: Progressing   Problem: Skin Integrity: Goal: Risk for impaired skin integrity will decrease Outcome: Progressing   Problem: Tissue Perfusion: Goal: Adequacy of tissue perfusion will improve Outcome: Progressing   Problem: Health Behavior/Discharge Planning: Goal: Ability to manage health-related needs will improve Outcome: Progressing   Problem: Clinical Measurements: Goal: Ability to maintain clinical measurements within normal limits will improve Outcome: Progressing Goal: Will remain free from infection Outcome: Progressing Goal: Diagnostic test results will improve Outcome: Progressing Goal: Respiratory complications will improve Outcome: Progressing Goal: Cardiovascular complication will be avoided Outcome: Progressing   Problem: Activity: Goal: Risk for activity intolerance will decrease Outcome: Progressing   Problem: Nutrition: Goal: Adequate nutrition will be maintained Outcome:  Progressing   Problem: Coping: Goal: Level of anxiety will decrease Outcome: Progressing   Problem: Elimination: Goal: Will not experience complications related to bowel motility Outcome: Progressing Goal: Will not experience complications related to urinary retention Outcome: Progressing   Problem: Pain Managment: Goal: General experience of comfort will improve Outcome: Progressing   Problem: Safety: Goal: Ability to remain free from injury will improve Outcome: Progressing   Problem: Skin Integrity: Goal: Risk for impaired skin integrity will decrease Outcome: Progressing   Problem: Education: Goal: Ability to verbalize activity precautions or restrictions will improve Outcome: Progressing Goal: Knowledge of the prescribed therapeutic regimen will improve Outcome: Progressing Goal: Understanding of discharge needs will improve Outcome: Progressing   Problem: Activity: Goal: Ability to avoid complications of mobility impairment will improve Outcome: Progressing Goal: Ability to tolerate increased activity will improve Outcome: Progressing Goal: Will remain free from falls Outcome: Progressing   Problem: Bowel/Gastric: Goal: Gastrointestinal status for postoperative course will improve Outcome: Progressing   Problem: Clinical Measurements: Goal: Ability to maintain clinical measurements within normal limits will improve Outcome: Progressing Goal: Postoperative complications will be avoided or minimized Outcome: Progressing Goal: Diagnostic test results will improve Outcome: Progressing   Problem: Pain Management: Goal: Pain level will decrease Outcome: Progressing   Problem: Skin Integrity: Goal: Will show signs of wound healing Outcome: Progressing   Problem: Health Behavior/Discharge Planning: Goal: Identification of resources available to assist in meeting health care needs will improve Outcome: Progressing   Problem: Bladder/Genitourinary: Goal:  Urinary functional status for postoperative course will improve Outcome: Progressing

## 2022-10-19 NOTE — Progress Notes (Signed)
Physical Therapy Treatment Patient Details Name: Pamela Cherry MRN: [id number] DOB: [birth year] Today's Date: 10/19/2022   History of Present Illness Pt is a 71 y/o female who presented with L sided numbness and weakness. MRI brain negative for acute infarct though MRI cervical spine showed severe spinal stenosis with cord compression and signal change. Pt underwent ACDF of C4-5, C5-5 on 11/2. 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    PT Comments    Pt with continued good progress towards acute goals. Pt reporting improvement in LUE/LE numbness/tingling and demonstrating ability to don RLE prosthesis and L shoe independently. Pt supervision for bed mobility and transfers and min guard throughout gait and stair training. Pt needing light cues for safety throughout. Encouraged pt to be up throughout day ambulating to<>from bathroom with staff as tolerated. Pt continues to benefit from skilled PT services to progress toward functional mobility goals.    Recommendations for follow up therapy are one component of a multi-disciplinary discharge planning process, led by the attending physician.  Recommendations may be updated based on patient status, additional functional criteria and insurance authorization.  Follow Up Recommendations  Home health PT     Assistance Recommended at Discharge Intermittent Supervision/Assistance  Patient can return home with the following Assistance with cooking/housework;A little help with bathing/dressing/bathroom;A little help with walking and/or transfers;Help with stairs or ramp for entrance;Assist for transportation   Equipment Recommendations  None recommended by PT    Recommendations for Other Services       Precautions / Restrictions Precautions Precautions: Fall;Other (comment);Cervical Precaution Booklet Issued: Yes (comment) Precaution Comments: R LE prosthetic (present in room) Required  Braces or Orthoses: Cervical Brace Cervical Brace: Soft collar;For comfort Restrictions Weight Bearing Restrictions: No     Mobility  Bed Mobility Overal bed mobility: Needs Assistance Bed Mobility: Supine to Sit     Supine to sit: Supervision, HOB elevated     General bed mobility comments: HOB elevated and pt able to move to long sit in bed then scoot LE's off EOB with supervision only and extra time.    Transfers Overall transfer level: Needs assistance Equipment used: Rolling walker (2 wheels) Transfers: Sit to/from Stand Sit to Stand: Supervision           General transfer comment: pt using bil UE's to power up and able to steady self with RW.  Pt able to donn and doff R prosthesis and L shoe independently.    Ambulation/Gait Ambulation/Gait assistance: Min guard, Min assist Gait Distance (Feet): 300 Feet Assistive device: Rolling walker (2 wheels) Gait Pattern/deviations: Step-to pattern, Step-through pattern, Decreased stride length, Decreased dorsiflexion - left Gait velocity: decr     General Gait Details: no bucklign of LE's pt with x1 LOB during turning with pt able to self correct. Pt's Lt LE adducting slightly due to impaired proprioception.   Stairs Stairs: Yes Stairs assistance: Min guard Stair Management: Two rails, Step to pattern, Forwards Number of Stairs: 4 General stair comments: up/down 2 steps x2 without LOB, pt prefernce to ascend with RLE and descend with same without fault   Wheelchair Mobility    Modified Rankin (Stroke Patients Only) Modified Rankin (Stroke Patients Only) Pre-Morbid Rankin Score: No significant disability Modified Rankin: Moderately severe disability     Balance Overall balance assessment: Needs assistance Sitting-balance support: No upper extremity supported, Feet supported Sitting balance-Leahy Scale: Good Sitting balance - Comments: able to don Rt prosthesis in sitting  Standing balance support: Rel[place]  on assistive device for balance, Bilateral upper extremity supported Standing balance-Leahy Scale: Poor Standing balance comment: Steady static stance but requiring UE support                            Cognition Arousal/Alertness: Awake/alert Behavior During Therapy: WFL for tasks assessed/performed Overall Cognitive Status: Within Functional Limits for tasks assessed                                 General Comments: A&O x4        Exercises      General Comments        Pertinent Vitals/Pain Pain Assessment Pain Assessment: No/denies pain Pain Intervention(s): Monitored during session    Home Living                          Prior Function            PT Goals (current goals can now be found in the care plan section) Acute Rehab PT Goals Patient Stated Goal: return home PT Goal Formulation: With patient Time For Goal Achievement: 10/27/22    Frequency    Min 5X/week      PT Plan Current plan remains appropriate    Co-evaluation              AM-PAC PT "6 Clicks" Mobility   Outcome Measure  Help needed turning from your back to your side while in a flat bed without using bedrails?: None Help needed moving from lying on your back to sitting on the side of a flat bed without using bedrails?: A Little Help needed moving to and from a bed to a chair (including a wheelchair)?: A Little Help needed standing up from a chair using your arms (e.g., wheelchair or bedside chair)?: A Little Help needed to walk in hospital room?: A Little Help needed climbing 3-5 steps with a railing? : A Lot 6 Click Score: 18    End of Session Equipment Utilized During Treatment: Gait belt;Cervical collar Activity Tolerance: Patient tolerated treatment well Patient left: in chair;with call bell/phone within reach;with chair alarm set Nurse Communication: Mobility status PT Visit Diagnosis: Other abnormalities of gait and mobility  (R26.89);Hemiplegia and hemiparesis Hemiplegia - Right/Left: Left Hemiplegia - dominant/non-dominant: Non-dominant Hemiplegia - caused by: Unspecified (cervical)     Time: 0947-0962 PT Time Calculation (min) (ACUTE ONLY): 28 min  Charges:  $Gait Training: 23-37 mins                     Shaundrea Carrigg R. PTA Acute Rehabilitation Services Office: [place] 10/19/2022, 10:55 AM

## 2022-10-19 NOTE — Progress Notes (Signed)
Occupational Therapy Treatment Patient Details Name: Pamela Cherry MRN: [id number] DOB: 1951-10-06 Today's Date: 10/19/2022   History of present illness Pt is a 71 y/o female who presented with L sided numbness and weakness. MRI brain negative for acute infarct though MRI cervical spine showed severe spinal stenosis with cord compression and signal change. Pt underwent ACDF of C4-5, C5-5 on 11/2. 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   OT comments  Session focused on L UE HEP education to improve strength and sensation. Pt able to return demo use of squeeze ball, theraputty and level 1 theraband (modified to maintain cervical precautions). Handouts provided to maximize carryover. Noted improvements in overall strength and pt reports mild sensation deficits still noted in LLE but mobilizing well. Reinforced cervical precautions and education on soft collar wearing schedule.   Recommendations for follow up therapy are one component of a multi-disciplinary discharge planning process, led by the attending physician.  Recommendations may be updated based on patient status, additional functional criteria and insurance authorization.    Follow Up Recommendations  Home health OT    Assistance Recommended at Discharge Set up Supervision/Assistance  Patient can return home with the following  Assistance with cooking/housework;Assist for transportation   Equipment Recommendations  None recommended by OT    Recommendations for Other Services      Precautions / Restrictions Precautions Precautions: Fall;Other (comment);Cervical Precaution Booklet Issued: Yes (comment) Precaution Comments: R LE prosthetic (present in room), ASO L foot Required Braces or Orthoses: Cervical Brace Cervical Brace: Soft collar;For comfort Restrictions Weight Bearing Restrictions: No       Mobility Bed Mobility                    Transfers                          Balance                                           ADL either performed or assessed with clinical judgement   ADL Overall ADL's : Needs assistance/impaired                                       General ADL Comments: Focus on LUE exercise education to improve strength and sensation    Extremity/Trunk Assessment Upper Extremity Assessment Upper Extremity Assessment: LUE deficits/detail LUE Deficits / Details: 3+/5 grip strength, improving sensation and improving biceps/triceps strength (symmetrical with RUE).   Lower Extremity Assessment Lower Extremity Assessment: Defer to PT evaluation        Vision   Vision Assessment?: No apparent visual deficits   Perception     Praxis      Cognition Arousal/Alertness: Awake/alert Behavior During Therapy: WFL for tasks assessed/performed Overall Cognitive Status: Within Functional Limits for tasks assessed                                          Exercises Exercises: Other exercises Other Exercises Other Exercises: squeeze ball x 10 L hand Other Exercises: theraputty exercises with L hand Other Exercises: yellow theraband 1 x 10  elbow flx, ext, and horizontal abduct    Shoulder Instructions       General Comments Pt hopeful to DC home today, messaged attending with reports of awaiting neurosx clearance prior to DC home    Pertinent Vitals/ Pain       Pain Assessment Pain Assessment: No/denies pain  Home Living                                          Prior Functioning/Environment              Frequency  Min 2X/week        Progress Toward Goals  OT Goals(current goals can now be found in the care plan section)  Progress towards OT goals: Progressing toward goals  Acute Rehab OT Goals Patient Stated Goal: go home today OT Goal Formulation: With patient Time For Goal Achievement: 10/27/22 Potential to  Achieve Goals: Good ADL Goals Pt Will Perform Lower Body Bathing: with modified independence;sit to/from stand Pt Will Transfer to Toilet: with modified independence;ambulating Additional ADL Goal #1: Pt to demo ability to gather ADL/IADL items with MOD I without LOB or safety concerns  Plan Discharge plan remains appropriate    Co-evaluation                 AM-PAC OT "6 Clicks" Daily Activity     Outcome Measure   Help from another person eating meals?: None Help from another person taking care of personal grooming?: A Little Help from another person toileting, which includes using toliet, bedpan, or urinal?: A Little Help from another person bathing (including washing, rinsing, drying)?: A Little Help from another person to put on and taking off regular upper body clothing?: A Little Help from another person to put on and taking off regular lower body clothing?: A Little 6 Click Score: 19    End of Session    OT Visit Diagnosis: Muscle weakness (generalized) (M62.81)   Activity Tolerance Patient tolerated treatment well   Patient Left in bed;with call bell/phone within reach   Nurse Communication          Time: 3557-3220 OT Time Calculation (min): 24 min  Charges: OT Treatments $Therapeutic Activity: 8-22 mins $Therapeutic Exercise: 8-22 mins  Malachy Chamber, OTR/L Acute Rehab Services Office: [phone]   Layla Maw 10/19/2022, 2:26 PM

## 2022-10-19 NOTE — TOC Progression Note (Signed)
Transition of Care [facility]) - Progression Note    Patient Details  Name: Pamela Cherry MRN: [id number] Date of Birth: 21-Jul-1951  [facility]) CM/SW Contact  Pollie Friar, RN Phone Number: 10/19/2022, 2:03 PM  Clinical Narrative:    Plan continues to be for home with [facility] services once medically ready.  TOC following.   Expected Discharge Plan: [place] Barriers to Discharge: Continued Medical Work up  Expected Discharge Plan and Services Expected Discharge Plan: [place]   Discharge Planning Services: CM Consult Post Acute Care Choice: [place] arrangements for the past 2 months: Single Family Home                           HH Arranged: PT, OT [facility] Agency: [place] Date [place]: 10/13/22   Representative spoke with at [place]: [place] Determinants of Health ([place]) Interventions Food Insecurity Interventions: [id number] Referral, Inpatient TOC Utilities Interventions: [id number] Referral, Inpatient TOC  Readmission Risk Interventions     No data to display

## 2022-10-19 NOTE — Inpatient Diabetes Management (Signed)
Inpatient Diabetes Program Recommendations  AACE/ADA: New Consensus Statement on Inpatient Glycemic Control   Target Ranges:  Prepandial:   less than 140 mg/dL      Peak postprandial:   less than 180 mg/dL (1-2 hours)      Critically ill patients:  140 - 180 mg/dL    Latest Reference Range & Units 10/19/22 06:11 10/19/22 12:16  Glucose-Capillary 70 - 99 mg/dL 201 (H) 312 (H)    Latest Reference Range & Units 10/18/22 08:20 10/18/22 11:06 10/18/22 15:15 10/18/22 20:16 10/18/22 22:23  Glucose-Capillary 70 - 99 mg/dL 117 (H) 126 (H) 113 (H) 242 (H) 238 (H)   Review of Glycemic Control  Diabetes history: DM2 Outpatient Diabetes medications: Glipizide XL 10 mg QAM, Metformin 1000 mg BID, Actos 30 mg daily Current orders for Inpatient glycemic control: Novolog 0-9 units TID with meals, Novolog 0-5 units QHS; Decadron 4 mg Q8H  Inpatient Diabetes Program Recommendations:    Insulin: If steroids are continued as ordered, please consider ordering Levemir 5 units BID.  Thanks, Barnie Alderman, RN, MSN, [place] Diabetes Coordinator Inpatient Diabetes Program [phone] (Team Pager from 8am to [place])

## 2022-10-19 NOTE — TOC Transition Note (Signed)
Transition of Care [facility]) - CM/SW Discharge Note   Patient Details  Name: ROSENDA GEFFRARD MRN: [id number] Date of Birth: 1950-12-31  Transition of Care [facility]) CM/SW Contact:  Pollie Friar, RN Phone Number: 10/19/2022, 4:06 PM   Clinical Narrative:    Pt discharging home with home health services through [place]. Information on the AVS. Pt has supervision at home and transportation to home.   Final next level of care: Home w Home Health Services Barriers to Discharge: No Barriers Identified   Patient Goals and CMS Choice   CMS Medicare.gov Compare Post Acute Care list provided to:: Patient Choice offered to / list presented to : Patient  Discharge Placement                       Discharge Plan and Services   Discharge Planning Services: CM Consult Post Acute Care Choice: Home Health                    HH Arranged: PT, OT [facility] Agency: [place] Date [place]: 10/13/22   Representative spoke with at [place]: [place] Determinants of Health ([place]) Interventions Food Insecurity Interventions: [id number] Referral, Inpatient TOC Utilities Interventions: [id number] Referral, Inpatient TOC   Readmission Risk Interventions     No data to display

## 2022-10-19 NOTE — Progress Notes (Signed)
OT Cancellation Note  Patient Details Name: Pamela Cherry MRN: [id number] DOB: 1951-03-29   Cancelled Treatment:    Reason Eval/Treat Not Completed: Other (comment) Pt eating lunch on entry. Provided theraputty, theraband and squeeze ball to further address L UE deficits. Will follow up as schedule permits.  Layla Maw 10/19/2022, 11:44 AM

## 2022-10-21 LAB — CULTURE, BLOOD (ROUTINE X 2)
Culture: NO GROWTH
Culture: NO GROWTH
Special Requests: ADEQUATE

## 2022-10-22 ENCOUNTER — Telehealth: Payer: Self-pay

## 2022-10-22 NOTE — Patient Outreach (Signed)
  Care Coordination TOC Note Transition Care Management Follow-up Telephone Call Date of discharge and from where[facility] 10/12/22-10/19/22 How have you been since you were released from the hospital? "I am doing ok, I am taking it easy" Any questions or concerns? No  Items Reviewed: Did the pt receive and understand the discharge instructions provided? Yes  Medications obtained and verified? Yes  Other? No  Any new allergies since your discharge? No  Dietary orders reviewed? Yes Do you have support at home? No   Home Care and Equipment/Supplies: Were home health services ordered? yes If so, what is the name of the agency? [facility]  Has the agency set up a time to come to the patient's home? yes Were any new equipment or medical supplies ordered?  No What is the name of the medical supply agency? N/a Were you able to get the supplies/equipment? no Do you have any questions related to the use of the equipment or supplies? No  Functional Questionnaire: (I = Independent and D = Dependent) ADLs: Needs some assistance  Bathing/Dressing- Needs some assistance  Meal Prep- I  Eating- I  Maintaining continence- I  Transferring/Ambulation- needs supervision  Managing Meds- I  Follow up appointments reviewed:  PCP Hospital f/u appt confirmed? No   Specialist Hospital f/u appt confirmed? Yes  Scheduled to see Dr. Roderic Palau on 11/08/22. Are transportation arrangements needed? No  If their condition worsens, is the pt aware to call PCP or go to the Emergency Dept.? Yes Was the patient provided with contact information for the PCP's office or ED? Yes Was to pt encouraged to call back with questions or concerns? Yes  SDOH assessments and interventions completed:   Yes  Care Coordination Interventions Activated:  Yes   Care Coordination Interventions:  PCP follow up appointment requested   Encounter Outcome:  Pt. Visit Completed

## 2022-11-17 DIAGNOSIS — M19011 Primary osteoarthritis, right shoulder: Secondary | ICD-10-CM | POA: Diagnosis not present

## 2022-11-18 NOTE — Progress Notes (Signed)
[name]) [place] Team Statin Quality Measure Assessment  11/18/2022  Pamela Cherry May 10, 1951 [id number]  Per review of chart and payor information, patient has a diagnosis of diabetes but is not currently filling a statin prescription.  This places patient into the Statin Use In Patients with Diabetes (SUPD) measure for CMS.    I could not find any documentation of previous trial of a statin or a history of statin intolerance. Patient has rosuvastatin on file, but per pharmacy claims this patient has not filled rosuvastatin in 2023. Per chart review, rosuvastatin prescription is expired. If deemed clinically appropriate, please consider prescription renewal or associate exclusion code (see options below) if medication as stopped due to intolerance.   The 10-year ASCVD risk score (Arnett DK, et al., 2019) is: 65.4%   Values used to calculate the score:     Age: 71 years     Sex: Female     Is Non-Hispanic African American: No     Diabetic: Yes     Tobacco smoker: Yes     Systolic Blood Pressure: 409 mmHg     Is BP treated: Yes     HDL Cholesterol: 27 mg/dL     Total Cholesterol: 167 mg/dL     Component Value Date/Time   CHOL 167 10/13/2022 0307   TRIG 194 (H) 10/13/2022 0307   HDL 27 (L) 10/13/2022 0307   CHOLHDL 6.2 10/13/2022 0307   VLDL 39 10/13/2022 0307   LDLCALC NOT CALCULATED 10/13/2022 0307   LDLCALC 83 09/03/2021 1159   LDLDIRECT 104 (H) 10/13/2022 0849   Next appointment with PCP on 11/22/2022.   Please consider ONE of the following recommendations:  Initiate high intensity statin Atorvastatin 40 mg once daily, #90, 3 refills   Rosuvastatin 20 mg once daily, #90, 3 refills    Initiate moderate intensity          statin with reduced frequency if prior          statin intolerance 1x weekly, #13, 3 refills   2x weekly, #26, 3 refills   3x weekly, #39, 3 refills    Code for past statin intolerance or  other exclusions (required  annually)  Provider Requirements: Associate code during an office visit or telehealth encounter  Drug Induced Myopathy G72.0   Myopathy, unspecified G72.9   Myositis, unspecified M60.9   Rhabdomyolysis M62.82   Cirrhosis of liver K74.69   Prediabetes R73.03   PCOS E28.2   Thank you for allowing [facility] pharmacy to be a part of this patient's care.   Kristeen Miss, PharmD Clinical Pharmacist [place] Cell: [phone]

## 2022-11-22 ENCOUNTER — Ambulatory Visit: Payer: Medicare HMO | Admitting: Family Medicine

## 2022-12-02 ENCOUNTER — Telehealth: Payer: Self-pay

## 2022-12-02 ENCOUNTER — Encounter: Payer: Self-pay | Admitting: Family Medicine

## 2022-12-02 ENCOUNTER — Ambulatory Visit (INDEPENDENT_AMBULATORY_CARE_PROVIDER_SITE_OTHER): Payer: Medicare HMO | Admitting: Family Medicine

## 2022-12-02 ENCOUNTER — Ambulatory Visit
Admission: RE | Admit: 2022-12-02 | Discharge: 2022-12-02 | Disposition: A | Discharge status: Home / Self Care | Payer: Medicare HMO | Source: Ambulatory Visit | Attending: Family Medicine | Admitting: Family Medicine

## 2022-12-02 VITALS — BP 132/82 | HR 82 | Ht 64.0 in | Wt 198.4 lb

## 2022-12-02 DIAGNOSIS — M79672 Pain in left foot: Secondary | ICD-10-CM

## 2022-12-02 DIAGNOSIS — E039 Hypothyroidism, unspecified: Secondary | ICD-10-CM

## 2022-12-02 DIAGNOSIS — E118 Type 2 diabetes mellitus with unspecified complications: Secondary | ICD-10-CM | POA: Diagnosis not present

## 2022-12-02 DIAGNOSIS — M7989 Other specified soft tissue disorders: Secondary | ICD-10-CM | POA: Diagnosis not present

## 2022-12-02 DIAGNOSIS — I10 Essential (primary) hypertension: Secondary | ICD-10-CM

## 2022-12-02 DIAGNOSIS — I6521 Occlusion and stenosis of right carotid artery: Secondary | ICD-10-CM | POA: Diagnosis not present

## 2022-12-02 DIAGNOSIS — D509 Iron deficiency anemia, unspecified: Secondary | ICD-10-CM | POA: Diagnosis not present

## 2022-12-02 MED ORDER — ROSUVASTATIN CALCIUM 20 MG PO TABS: 20.0000 mg | ORAL_TABLET | Freq: Every day | ORAL | 3 refills | Status: DC
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
## 2022-12-02 NOTE — Telephone Encounter (Signed)
Pt called requesting x-ray results. Advised pt report is not available yet. The images are available. Thank you.

## 2022-12-03 ENCOUNTER — Other Ambulatory Visit: Payer: Self-pay

## 2022-12-03 DIAGNOSIS — E039 Hypothyroidism, unspecified: Secondary | ICD-10-CM

## 2022-12-03 LAB — LIPID PANEL
Cholesterol: 180 mg/dL (ref <200)
HDL: 42 mg/dL — ABNORMAL LOW (ref >=50)
LDL Cholesterol (Calc): 120 mg/dL (calc) — ABNORMAL HIGH
Non-HDL Cholesterol (Calc): 138 mg/dL (calc) — ABNORMAL HIGH (ref <130)
Total CHOL/HDL Ratio: 4.3 (calc) (ref <5.0)
Triglycerides: 82 mg/dL (ref <150)

## 2022-12-03 LAB — COMPLETE METABOLIC PANEL WITH GFR
AG Ratio: 1.4 (calc) (ref 1.0–2.5)
ALT: 6 U/L (ref 6–29)
AST: 13 U/L (ref 10–35)
Albumin: 3.7 g/dL (ref 3.6–5.1)
Alkaline phosphatase (APISO): 90 U/L (ref 37–153)
BUN/Creatinine Ratio: 23 (calc) — ABNORMAL HIGH (ref 6–22)
BUN: 13 mg/dL (ref 7–25)
CO2: 26 mmol/L (ref 20–32)
Calcium: 9.1 mg/dL (ref 8.6–10.4)
Chloride: 106 mmol/L (ref 98–110)
Creat: 0.56 mg/dL — ABNORMAL LOW (ref 0.60–1.00)
Globulin: 2.7 g/dL (calc) (ref 1.9–3.7)
Glucose, Bld: 91 mg/dL (ref 65–99)
Potassium: 3.6 mmol/L (ref 3.5–5.3)
Sodium: 142 mmol/L (ref 135–146)
Total Bilirubin: 0.3 mg/dL (ref 0.2–1.2)
Total Protein: 6.4 g/dL (ref 6.1–8.1)
eGFR: 97 mL/min/{1.73_m2} (ref >=60)

## 2022-12-03 LAB — CBC WITH DIFFERENTIAL/PLATELET
Absolute Monocytes: 421 cells/uL (ref 200–950)
Basophils Absolute: 22 cells/uL (ref 0–200)
Basophils Relative: 0.4 %
Eosinophils Absolute: 103 cells/uL (ref 15–500)
Eosinophils Relative: 1.9 %
HCT: 34.1 % — ABNORMAL LOW (ref 35.0–45.0)
Hemoglobin: 10.6 g/dL — ABNORMAL LOW (ref 11.7–15.5)
Lymphs Abs: 1301 cells/uL (ref 850–3900)
MCH: 23.5 pg — ABNORMAL LOW (ref 27.0–33.0)
MCHC: 31.1 g/dL — ABNORMAL LOW (ref 32.0–36.0)
MCV: 75.6 fL — ABNORMAL LOW (ref 80.0–100.0)
MPV: 10.4 fL (ref 7.5–12.5)
Monocytes Relative: 7.8 %
Neutro Abs: 3553 cells/uL (ref 1500–7800)
Neutrophils Relative %: 65.8 %
Platelets: 296 10*3/uL (ref 140–400)
RBC: 4.51 10*6/uL (ref 3.80–5.10)
RDW: 16 % — ABNORMAL HIGH (ref 11.0–15.0)
Total Lymphocyte: 24.1 %
WBC: 5.4 10*3/uL (ref 3.8–10.8)

## 2022-12-03 LAB — HEMOGLOBIN A1C
Hgb A1c MFr Bld: 6 % of total Hgb — ABNORMAL HIGH (ref <5.7)
Mean Plasma Glucose: 126 mg/dL
eAG (mmol/L): 7 mmol/L

## 2022-12-03 LAB — TSH: TSH: 6.46 mIU/L — ABNORMAL HIGH (ref 0.40–4.50)

## 2022-12-03 MED ORDER — LEVOTHYROXINE SODIUM 175 MCG PO TABS: 175.0000 ug | ORAL_TABLET | Freq: Every day | ORAL | 3 refills | Status: DC

## 2022-12-07 ENCOUNTER — Other Ambulatory Visit: Payer: Self-pay

## 2022-12-08 ENCOUNTER — Telehealth: Payer: Self-pay | Admitting: Family Medicine

## 2022-12-08 DIAGNOSIS — R200 Anesthesia of skin: Secondary | ICD-10-CM

## 2022-12-08 NOTE — Telephone Encounter (Signed)
I called patient to see if we could schedule her mammogram. Patient declined mammogram.
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
## 2022-12-14 ENCOUNTER — Encounter: Payer: Self-pay | Admitting: Vascular Surgery

## 2022-12-14 ENCOUNTER — Ambulatory Visit (INDEPENDENT_AMBULATORY_CARE_PROVIDER_SITE_OTHER): Payer: Medicare HMO | Admitting: Vascular Surgery

## 2022-12-14 VITALS — BP 136/73 | HR 84 | Temp 98.7°F | Resp 20 | Ht 64.0 in | Wt 202.0 lb

## 2022-12-14 DIAGNOSIS — I6521 Occlusion and stenosis of right carotid artery: Secondary | ICD-10-CM

## 2022-12-14 DIAGNOSIS — Z89431 Acquired absence of right foot: Secondary | ICD-10-CM | POA: Diagnosis not present

## 2022-12-20 ENCOUNTER — Other Ambulatory Visit: Payer: Self-pay

## 2022-12-20 DIAGNOSIS — E118 Type 2 diabetes mellitus with unspecified complications: Secondary | ICD-10-CM

## 2022-12-20 MED ORDER — ACCU-CHEK COMPACT PLUS VI STRP: ORAL_STRIP | 12 refills | Status: DC

## 2022-12-21 NOTE — Telephone Encounter (Signed)
Pt called to asked for referral to neurologist re numbness in her hand and arm. Per pt pcp is aware of hand being numb. Referral placed 12/21/22

## 2022-12-24 ENCOUNTER — Other Ambulatory Visit: Payer: Self-pay

## 2022-12-24 DIAGNOSIS — E118 Type 2 diabetes mellitus with unspecified complications: Secondary | ICD-10-CM

## 2022-12-24 MED ORDER — ACCU-CHEK COMPACT PLUS VI STRP: ORAL_STRIP | 12 refills | Status: DC

## 2022-12-27 ENCOUNTER — Other Ambulatory Visit: Payer: Self-pay

## 2022-12-27 DIAGNOSIS — E118 Type 2 diabetes mellitus with unspecified complications: Secondary | ICD-10-CM

## 2022-12-27 MED ORDER — ACCU-CHEK COMPACT PLUS VI STRP: ORAL_STRIP | 12 refills | Status: DC

## 2022-12-30 ENCOUNTER — Ambulatory Visit (INDEPENDENT_AMBULATORY_CARE_PROVIDER_SITE_OTHER): Payer: Medicare HMO | Admitting: Family Medicine

## 2022-12-30 ENCOUNTER — Encounter: Payer: Self-pay | Admitting: Family Medicine

## 2022-12-30 VITALS — BP 124/76 | HR 80 | Temp 98.4°F | Ht 64.0 in | Wt 198.4 lb

## 2022-12-30 DIAGNOSIS — R200 Anesthesia of skin: Secondary | ICD-10-CM | POA: Diagnosis not present

## 2022-12-30 DIAGNOSIS — G8929 Other chronic pain: Secondary | ICD-10-CM | POA: Insufficient documentation

## 2022-12-30 DIAGNOSIS — G959 Disease of spinal cord, unspecified: Secondary | ICD-10-CM | POA: Insufficient documentation
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
## 2023-01-04 ENCOUNTER — Telehealth: Payer: Self-pay

## 2023-01-04 ENCOUNTER — Other Ambulatory Visit: Payer: Self-pay

## 2023-01-04 DIAGNOSIS — E118 Type 2 diabetes mellitus with unspecified complications: Secondary | ICD-10-CM

## 2023-01-04 MED ORDER — ACCU-CHEK COMPACT PLUS VI STRP: ORAL_STRIP | 12 refills | Status: DC

## 2023-01-04 NOTE — Telephone Encounter (Signed)
Pt called today asking to resend the Accu Check test strips to [facility] [address], since [facility] don't have any, its on back order.   [facility].

## 2023-01-07 ENCOUNTER — Other Ambulatory Visit: Payer: Self-pay

## 2023-01-07 DIAGNOSIS — E118 Type 2 diabetes mellitus with unspecified complications: Secondary | ICD-10-CM

## 2023-01-07 MED ORDER — LANCET DEVICE MISC: 1.0000 | Freq: Three times a day (TID) | 1 refills | Status: AC

## 2023-01-07 MED ORDER — LANCETS MISC. MISC: 1.0000 | Freq: Three times a day (TID) | 1 refills | Status: AC

## 2023-01-07 MED ORDER — BLOOD GLUCOSE TEST VI STRP: 1.0000 | ORAL_STRIP | Freq: Three times a day (TID) | 1 refills | Status: AC

## 2023-01-07 MED ORDER — BLOOD GLUCOSE MONITORING SUPPL DEVI: 1.0000 | Freq: Three times a day (TID) | 0 refills | Status: AC

## 2023-01-13 ENCOUNTER — Telehealth: Payer: Self-pay | Admitting: Family Medicine

## 2023-01-13 NOTE — Telephone Encounter (Signed)
Prescription Request  01/13/2023  Is this a "Controlled Substance" medicine? no  LOV: 12/30/2022  What is the name of the medication or equipment?   BD SINGLE USE SWAB ACCU-CHEK GUIDE MONITOR SYSTEM ACCU-CHEK [place] LANCETS  Have you contacted your pharmacy to request a refill? Yes   Which pharmacy would you like this sent to?  [facility], [place] [address] Phone: [phone] Fax: [phone]    Patient notified that their request is being sent to the clinical staff for review and that they should receive a response within 2 business days.   Please advise pharmacist. Fax to [phone]

## 2023-01-14 ENCOUNTER — Other Ambulatory Visit: Payer: Self-pay

## 2023-01-14 DIAGNOSIS — E118 Type 2 diabetes mellitus with unspecified complications: Secondary | ICD-10-CM

## 2023-01-14 MED ORDER — BD SWAB SINGLE USE REGULAR PADS: MEDICATED_PAD | 3 refills | Status: DC

## 2023-02-08 ENCOUNTER — Institutional Professional Consult (permissible substitution): Payer: Medicare HMO | Admitting: Diagnostic Neuroimaging

## 2023-02-08 ENCOUNTER — Telehealth: Payer: Self-pay

## 2023-02-08 NOTE — Telephone Encounter (Signed)
Pt called wanted to talk to you about the bladder control/condoms for pt's husband, Robie? Pt would like for you to call her back.  [phone]

## 2023-03-21 ENCOUNTER — Telehealth: Payer: Self-pay

## 2023-03-21 ENCOUNTER — Other Ambulatory Visit: Payer: Self-pay | Admitting: Family Medicine

## 2023-03-21 MED ORDER — CEPHALEXIN 500 MG PO CAPS: 500.0000 mg | ORAL_CAPSULE | Freq: Three times a day (TID) | ORAL | 0 refills | Status: DC

## 2023-03-21 NOTE — Telephone Encounter (Signed)
Pt called stating that she thinks she has a kidney infection. Pt states she is experiencing some lower back pain. Pt wanted to ask nurse if she could take some of her husbands antibiotics. Please advise.  Cb#: [phone]

## 2023-03-22 ENCOUNTER — Ambulatory Visit: Payer: Medicare HMO | Admitting: Family Medicine

## 2023-05-11 ENCOUNTER — Ambulatory Visit: Payer: Medicare HMO | Admitting: Family Medicine

## 2023-05-18 ENCOUNTER — Other Ambulatory Visit: Payer: Self-pay | Admitting: Family Medicine

## 2023-05-18 NOTE — Telephone Encounter (Signed)
Rx 10/11/22 #90 2RF- too soon Requested Prescriptions  Pending Prescriptions Disp Refills   FEROSUL 325 (65 Fe) MG tablet [Pharmacy Med Name: FEROSUL 325 (65 Fe) MG Tablet] 90 tablet 3    Sig: TAKE 1 TABLET EVERY DAY WITH BREAKFAST     Endocrinology:  Minerals - Iron Supplementation Failed - 05/18/2023  3:55 AM      Failed - HGB in normal range and within 360 days    Hemoglobin  Date Value Ref Range Status  12/02/2022 10.6 (L) 11.7 - 15.5 g/dL Final  16/09/9603 54.0 (L) 12.0 - 15.0 g/dL Final         Failed - HCT in normal range and within 360 days    HCT  Date Value Ref Range Status  12/02/2022 34.1 (L) 35.0 - 45.0 % Final         Failed - Fe (serum) in normal range and within 360 days    Iron  Date Value Ref Range Status  08/05/2022 22 (L) 45 - 160 mcg/dL Final   %SAT  Date Value Ref Range Status  09/03/2021 9 (L) 16 - 45 % (calc) Final   Saturation Ratios  Date Value Ref Range Status  12/01/2021 7 (L) 10.4 - 31.8 % Final         Failed - Ferritin in normal range and within 360 days    Ferritin  Date Value Ref Range Status  12/01/2021 34 11 - 307 ng/mL Final    Comment:    Performed at [facility], [address] [place], [place] [address]         Failed - Valid encounter within last 12 months    Recent Outpatient Visits           1 year ago COPD exacerbation (HCC)   [facility] Donita Brooks, MD   1 year ago Type 2 diabetes mellitus without complication, unspecified whether long term insulin use (HCC)   [facility] Donita Brooks, MD   3 years ago Type 2 diabetes mellitus with other specified complication, unspecified whether long term insulin use (HCC)   [facility] Pickard, Priscille Heidelberg, MD   4 years ago Autonomic neuropathy   [facility] Pickard, Priscille Heidelberg, MD   4 years ago Orthostatic hypotension   [facility] Pickard, Priscille Heidelberg, MD               Passed - RBC in normal range and within 360 days    RBC  Date Value Ref Range Status  12/02/2022 4.51 3.80 - 5.10 Million/uL Final

## 2023-07-14 ENCOUNTER — Other Ambulatory Visit: Payer: Self-pay | Admitting: Family Medicine

## 2023-07-14 ENCOUNTER — Telehealth: Payer: Self-pay | Admitting: Family Medicine

## 2023-07-14 MED ORDER — CEPHALEXIN 500 MG PO CAPS: 500.0000 mg | ORAL_CAPSULE | Freq: Three times a day (TID) | ORAL | 0 refills | Status: DC

## 2023-07-14 NOTE — Telephone Encounter (Signed)
Patient called to report kidney infection; stated she's had it before and recognized the sx. Patient requesting a refill of cephALEXin (KEFLEX) 500 MG capsule  Patient stated her back hurts, and it hurts to walk. No fever. Painful urination.  Recommended for patient to come to the office for an appointment and to have a urine specimen taken; patient stated she won't have money to put gas in the car to get here until 08/03/23.  Pharmacy confirmed as:  [id number] [place], [place] - [address] [place] [address] Phone: [phone]  Fax: [phone] DEA #: [id number]    Please advise at [phone].

## 2023-08-10 DIAGNOSIS — H524 Presbyopia: Secondary | ICD-10-CM | POA: Diagnosis not present

## 2023-08-10 DIAGNOSIS — H25013 Cortical age-related cataract, bilateral: Secondary | ICD-10-CM | POA: Diagnosis not present

## 2023-08-10 DIAGNOSIS — H40013 Open angle with borderline findings, low risk, bilateral: Secondary | ICD-10-CM | POA: Diagnosis not present

## 2023-08-10 DIAGNOSIS — H2513 Age-related nuclear cataract, bilateral: Secondary | ICD-10-CM | POA: Diagnosis not present

## 2023-08-10 DIAGNOSIS — E119 Type 2 diabetes mellitus without complications: Secondary | ICD-10-CM | POA: Diagnosis not present

## 2023-09-12 ENCOUNTER — Ambulatory Visit (INDEPENDENT_AMBULATORY_CARE_PROVIDER_SITE_OTHER): Payer: Medicare HMO

## 2023-09-12 DIAGNOSIS — Z23 Encounter for immunization: Secondary | ICD-10-CM | POA: Diagnosis not present

## 2023-10-03 ENCOUNTER — Encounter: Payer: Self-pay | Admitting: Family Medicine

## 2023-10-03 ENCOUNTER — Ambulatory Visit (INDEPENDENT_AMBULATORY_CARE_PROVIDER_SITE_OTHER): Payer: Medicare HMO | Admitting: Family Medicine

## 2023-10-03 VITALS — BP 152/92 | HR 67 | Temp 98.4°F | Ht 64.0 in | Wt 202.6 lb

## 2023-10-03 DIAGNOSIS — E118 Type 2 diabetes mellitus with unspecified complications: Secondary | ICD-10-CM | POA: Diagnosis not present

## 2023-10-03 DIAGNOSIS — E039 Hypothyroidism, unspecified: Secondary | ICD-10-CM

## 2023-10-03 DIAGNOSIS — H9313 Tinnitus, bilateral: Secondary | ICD-10-CM | POA: Diagnosis not present

## 2023-10-03 DIAGNOSIS — I10 Essential (primary) hypertension: Secondary | ICD-10-CM | POA: Diagnosis not present

## 2023-10-03 MED ORDER — AMLODIPINE BESYLATE 5 MG PO TABS: 5.0000 mg | ORAL_TABLET | Freq: Every day | ORAL | 3 refills | Status: DC
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
## 2023-10-04 ENCOUNTER — Other Ambulatory Visit: Payer: Self-pay

## 2023-10-04 LAB — COMPLETE METABOLIC PANEL WITH GFR
AG Ratio: 1.3 (calc) (ref 1.0–2.5)
ALT: 7 U/L (ref 6–29)
AST: 14 U/L (ref 10–35)
Albumin: 3.5 g/dL — ABNORMAL LOW (ref 3.6–5.1)
Alkaline phosphatase (APISO): 99 U/L (ref 37–153)
BUN: 9 mg/dL (ref 7–25)
CO2: 27 mmol/L (ref 20–32)
Calcium: 8.8 mg/dL (ref 8.6–10.4)
Chloride: 104 mmol/L (ref 98–110)
Creat: 0.61 mg/dL (ref 0.60–1.00)
Globulin: 2.8 g/dL (ref 1.9–3.7)
Glucose, Bld: 126 mg/dL — ABNORMAL HIGH (ref 65–99)
Potassium: 3.9 mmol/L (ref 3.5–5.3)
Sodium: 140 mmol/L (ref 135–146)
Total Bilirubin: 0.3 mg/dL (ref 0.2–1.2)
Total Protein: 6.3 g/dL (ref 6.1–8.1)
eGFR: 94 mL/min/{1.73_m2} (ref >=60)

## 2023-10-04 LAB — HEMOGLOBIN A1C
Hgb A1c MFr Bld: 6.9 %{Hb} — ABNORMAL HIGH (ref <5.7)
Mean Plasma Glucose: 151 mg/dL
eAG (mmol/L): 8.4 mmol/L

## 2023-10-04 LAB — LIPID PANEL
Cholesterol: 143 mg/dL (ref <200)
HDL: 32 mg/dL — ABNORMAL LOW (ref >=50)
LDL Cholesterol (Calc): 80 mg/dL
Non-HDL Cholesterol (Calc): 111 mg/dL (ref <130)
Total CHOL/HDL Ratio: 4.5 (calc) (ref <5.0)
Triglycerides: 213 mg/dL — ABNORMAL HIGH (ref <150)

## 2023-10-04 LAB — CBC WITH DIFFERENTIAL/PLATELET
Absolute Lymphocytes: 1321 {cells}/uL (ref 850–3900)
Absolute Monocytes: 426 {cells}/uL (ref 200–950)
Basophils Absolute: 43 {cells}/uL (ref 0–200)
Basophils Relative: 0.6 %
Eosinophils Absolute: 156 {cells}/uL (ref 15–500)
Eosinophils Relative: 2.2 %
HCT: 34.9 % — ABNORMAL LOW (ref 35.0–45.0)
Hemoglobin: 10.2 g/dL — ABNORMAL LOW (ref 11.7–15.5)
MCH: 22.5 pg — ABNORMAL LOW (ref 27.0–33.0)
MCHC: 29.2 g/dL — ABNORMAL LOW (ref 32.0–36.0)
MCV: 76.9 fL — ABNORMAL LOW (ref 80.0–100.0)
MPV: 11.5 fL (ref 7.5–12.5)
Monocytes Relative: 6 %
Neutro Abs: 5155 {cells}/uL (ref 1500–7800)
Neutrophils Relative %: 72.6 %
Platelets: 250 10*3/uL (ref 140–400)
RBC: 4.54 10*6/uL (ref 3.80–5.10)
RDW: 17.5 % — ABNORMAL HIGH (ref 11.0–15.0)
Total Lymphocyte: 18.6 %
WBC: 7.1 10*3/uL (ref 3.8–10.8)

## 2023-10-04 LAB — MICROALBUMIN / CREATININE URINE RATIO
Creatinine, Urine: 79 mg/dL (ref 20–275)
Microalb Creat Ratio: 16 mg/g{creat} (ref <30)
Microalb, Ur: 1.3 mg/dL

## 2023-10-04 LAB — TSH: TSH: 7.59 m[IU]/L — ABNORMAL HIGH (ref 0.40–4.50)

## 2023-10-04 MED ORDER — LEVOTHYROXINE SODIUM 200 MCG PO TABS: 200.0000 ug | ORAL_TABLET | Freq: Every day | ORAL | 3 refills | Status: AC

## 2023-10-24 ENCOUNTER — Other Ambulatory Visit: Payer: Self-pay | Admitting: Family Medicine

## 2023-10-24 ENCOUNTER — Telehealth: Payer: Self-pay

## 2023-10-24 MED ORDER — GABAPENTIN 300 MG PO CAPS: 300.0000 mg | ORAL_CAPSULE | Freq: Three times a day (TID) | ORAL | 1 refills | Status: AC

## 2023-10-24 NOTE — Telephone Encounter (Signed)
Copied from CRM [id number]. Topic: Clinical - Medication Refill >> Oct 24, 2023 12:51 PM Eunice Blase wrote: Most Recent Primary Care Visit:  Provider: Lynnea Ferrier T  Department: [facility]  Visit Type: OFFICE VISIT  Date: 10/03/2023  Medication: gabapentin (NEURONTIN) 300 MG capsule  Has the patient contacted their pharmacy? Yes (Agent: If no, request that the patient contact the pharmacy for the refill. If patient does not wish to contact the pharmacy document the reason why and proceed with request.) (Agent: If yes, when and what did the pharmacy advise?)  Is this the correct pharmacy for this prescription? Yes If no, delete pharmacy and type the correct one.  This is the patient's preferred pharmacy:  [facility] - [place], [place] - [address] [place] [address] Phone: [phone] Fax: [phone]   Has the prescription been filled recently? Yes  Is the patient out of the medication? Yes  Has the patient been seen for an appointment in the last year OR does the patient have an upcoming appointment? Yes  Can we respond through MyChart? No  Agent: Please be advised that Rx refills may take up to 3 business days. We ask that you follow-up with your pharmacy.

## 2023-11-22 DIAGNOSIS — H9113 Presbycusis, bilateral: Secondary | ICD-10-CM | POA: Insufficient documentation

## 2023-11-22 DIAGNOSIS — H903 Sensorineural hearing loss, bilateral: Secondary | ICD-10-CM | POA: Diagnosis not present

## 2023-11-22 DIAGNOSIS — H9313 Tinnitus, bilateral: Secondary | ICD-10-CM | POA: Insufficient documentation

## 2023-12-20 DIAGNOSIS — H2513 Age-related nuclear cataract, bilateral: Secondary | ICD-10-CM | POA: Diagnosis not present

## 2023-12-20 DIAGNOSIS — H538 Other visual disturbances: Secondary | ICD-10-CM | POA: Diagnosis not present

## 2023-12-26 ENCOUNTER — Telehealth: Payer: Self-pay

## 2023-12-26 NOTE — Telephone Encounter (Signed)
 Called and spoke with patient. Pt states she would like a referral to NEUROLOGY. Left elbow is painful and causes numbness and tingling to her hand. It looks like she did have a visit with you on 12/30/2022 regarding this same issue. Okay to refer? Thanks.   Copied from CRM [id number]. Topic: Referral - Request for Referral >> Dec 26, 2023 12:43 PM Twila L wrote: Did the patient discuss referral with their provider in the last year? Yes (If No - schedule appointment) (If Yes - send message)  Appointment offered? No  Type of order/referral and detailed reason for visit: urology  Preference of office, provider, location: patient prefer [facility]  If referral order, have you been seen by this specialty before? No (If Yes, this issue or another issue? When? Where?  Can we respond through MyChart? No

## 2023-12-27 ENCOUNTER — Other Ambulatory Visit: Payer: Self-pay

## 2023-12-27 DIAGNOSIS — R200 Anesthesia of skin: Secondary | ICD-10-CM

## 2024-01-10 ENCOUNTER — Other Ambulatory Visit: Payer: Self-pay

## 2024-01-10 ENCOUNTER — Telehealth: Payer: Self-pay | Admitting: Family Medicine

## 2024-01-10 ENCOUNTER — Telehealth: Payer: Self-pay

## 2024-01-10 DIAGNOSIS — R200 Anesthesia of skin: Secondary | ICD-10-CM

## 2024-01-10 NOTE — Telephone Encounter (Signed)
Copied from CRM [id number]. Topic: Referral - Status >> Jan 10, 2024  2:54 PM Pamela Cherry wrote: Reason for CRM: Patient was calling to check referral status for neurology. Said she called [facility] and they said she needed a referral from her doctor. I am seeing Dr. Tanya Nones sent a referral for neurology on the 14th of this month, not sure to where but it was denied due to duplicate request. The patient is confused on what's going on. I did reach out to Mrs. Pamela Cherry but her phone is going straight to voicemail. Patient would like some clarification on what's going on with the referral

## 2024-01-10 NOTE — Telephone Encounter (Signed)
Copied from CRM [id number]. Topic: Referral - Status >> Jan 10, 2024  3:40 PM Hector Shade B wrote: Reason for CRM: Patient is calling to request an explanation of why her records were sent over to the neurology clinic then appt was canceled by this office patient is needing for someone to call and explain what is going on [phone]

## 2024-01-11 ENCOUNTER — Telehealth: Payer: Self-pay

## 2024-01-11 NOTE — Telephone Encounter (Signed)
Copied from CRM [id number]. Topic: Referral - Status >> Jan 10, 2024  3:49 PM Geroge Baseman wrote: Reason for CRM: Pamela Cherry calling wanting to know why her neurology referral was denied, she would like a call back she is very confused

## 2024-01-16 ENCOUNTER — Other Ambulatory Visit: Payer: Self-pay

## 2024-01-16 ENCOUNTER — Emergency Department (HOSPITAL_COMMUNITY)
Admission: EM | Admit: 2024-01-16 | Discharge: 2024-01-16 | Discharge status: Against Medical Advice | Payer: Medicare HMO | Attending: Emergency Medicine | Admitting: Emergency Medicine

## 2024-01-16 ENCOUNTER — Telehealth: Payer: Self-pay | Admitting: Diagnostic Neuroimaging

## 2024-01-16 ENCOUNTER — Emergency Department (HOSPITAL_COMMUNITY): Payer: Medicare HMO

## 2024-01-16 ENCOUNTER — Encounter (HOSPITAL_COMMUNITY): Payer: Self-pay

## 2024-01-16 ENCOUNTER — Telehealth: Payer: Self-pay | Admitting: Family Medicine

## 2024-01-16 ENCOUNTER — Telehealth: Payer: Self-pay

## 2024-01-16 DIAGNOSIS — M79602 Pain in left arm: Secondary | ICD-10-CM | POA: Insufficient documentation

## 2024-01-16 DIAGNOSIS — Z5321 Procedure and treatment not carried out due to patient leaving prior to being seen by health care provider: Secondary | ICD-10-CM | POA: Insufficient documentation

## 2024-01-16 DIAGNOSIS — R202 Paresthesia of skin: Secondary | ICD-10-CM | POA: Diagnosis not present

## 2024-01-16 LAB — CBC
HCT: 32.9 % — ABNORMAL LOW (ref 36.0–46.0)
Hemoglobin: 9.7 g/dL — ABNORMAL LOW (ref 12.0–15.0)
MCH: 22.6 pg — ABNORMAL LOW (ref 26.0–34.0)
MCHC: 29.5 g/dL — ABNORMAL LOW (ref 30.0–36.0)
MCV: 76.5 fL — ABNORMAL LOW (ref 80.0–100.0)
Platelets: 239 10*3/uL (ref 150–400)
RBC: 4.3 MIL/uL (ref 3.87–5.11)
RDW: 18.9 % — ABNORMAL HIGH (ref 11.5–15.5)
WBC: 6.2 10*3/uL (ref 4.0–10.5)
nRBC: 0 % (ref 0.0–0.2)

## 2024-01-16 LAB — BASIC METABOLIC PANEL
Anion gap: 13 (ref 5–15)
BUN: 7 mg/dL — ABNORMAL LOW (ref 8–23)
CO2: 23 mmol/L (ref 22–32)
Calcium: 8.8 mg/dL — ABNORMAL LOW (ref 8.9–10.3)
Chloride: 103 mmol/L (ref 98–111)
Creatinine, Ser: 0.63 mg/dL (ref 0.44–1.00)
GFR, Estimated: >60 mL/min (ref >60)
Glucose, Bld: 142 mg/dL — ABNORMAL HIGH (ref 70–99)
Potassium: 3.4 mmol/L — ABNORMAL LOW (ref 3.5–5.1)
Sodium: 139 mmol/L (ref 135–145)

## 2024-01-16 NOTE — Telephone Encounter (Signed)
Pt calling for an earlier appointment due to having numbness in left arm from fingers all the way up to pass elbow. Have not been to the emergency room. Would like call back.  Advised patient when having stroke like symptoms would need to go to the emergency room.

## 2024-01-16 NOTE — ED Triage Notes (Signed)
Pt c/o left arm numbness x 1 month ago. Pt states numbness started in hand and then went up her arm. Pt denies injury. Pt c/o pain in upper arm. Pt denies any changes today.

## 2024-01-16 NOTE — Telephone Encounter (Signed)
Patient called to check if there was an available appt.

## 2024-01-16 NOTE — ED Notes (Signed)
Pt stated she was leaving to both the NT and CT Tech. Bracelet removed and labels shredded. Pt requested security helped her to her car.

## 2024-01-16 NOTE — Telephone Encounter (Signed)
Copied from CRM [id number]. Topic: Referral - Question >> Jan 16, 2024  4:53 PM Shelah Lewandowsky wrote: Reason for CRM:  Left emergency room because it was taking too long, have numbness in left arm with pain above the elbow, need referral to nerve doctor that can see her sooner than May please call patient [phone]

## 2024-01-16 NOTE — ED Provider Triage Note (Signed)
Emergency Medicine Provider Triage Evaluation Note  Pamela Cherry , a 73 y.o. female  was evaluated in triage.  Pt complains of L arm numbness x 1 month. Started in the L hand, feels it's traveling up the arm. Now having pain in her left upper arm/shoulder. Feels like she's "being hit".   HX of left sided numbness in 2023, admitted for CVA workup, MRI was negative. Follows with neurology  Review of Systems  Positive: L arm numbness Negative: Chest pain, slurred speech, facial droop, global weakness  Physical Exam  BP (!) 180/96 (BP Location: Right Arm)   Pulse 86   Temp 98.6 F (37 C) (Oral)   Resp 17   Ht 5\' 4"  (1.626 m)   Wt 90.7 kg   SpO2 99%   BMI 34.33 kg/m  Gen:   Awake, no distress   Resp:  Normal effort  MSK:   Moves extremities without difficulty  Other:    Medical Decision Making  Medically screening exam initiated at 1:17 PM.  Appropriate orders placed.  NAZIAH PORTEE was informed that the remainder of the evaluation will be completed by another provider, this initial triage assessment does not replace that evaluation, and the importance of remaining in the ED until their evaluation is complete.  Workup initiated. Will obtain non con head CT, out of any code stroke window as sx have been present for months   Halleigh Comes T, PA-C 01/16/24 1317

## 2024-01-16 NOTE — Telephone Encounter (Signed)
Copied from CRM [id number]. Topic: Referral - Status >> Jan 16, 2024  9:36 AM Phill Myron wrote: Reason for CRM: pt Pamela Cherry would like to know te status of getting another her referral for a Neurology provider that has a sooner appt then May Please advise.

## 2024-01-16 NOTE — Telephone Encounter (Signed)
Called  the patient back to clarify. Pt has been suffering with this numbness in left upper arm/hand. This is not a new symptom and she states been going on for a month. After investigating the chart, looks like referral was sent over a year ago and pt missed that apt due to going out of town  Referral was sent 12/21/2022 Per pt her hand and arm is getting numb and seems to be getting worst.   Pt was scheduled 2/27 and pt cancelled because going out of town. There was never an apt rescheduled. Looks like a new referral was resent for 12/27/2023 because the other one was over a year old.   Pt states that she is worried because this has been going on and she isn't sure if she has had a stroke or whether it is something serious. I advised that considering this has been ongoing for over a year and is not new, it is not something likely to be concerned off but I reviewed stroke like symptoms and informed her if she is fearful that she is having stroke like symptoms then she would need to go to ER. In the meantime, I have the pt on wait list and I will also continue to monitor and see if opening comes available. She verbalized understanding.

## 2024-01-18 ENCOUNTER — Telehealth: Payer: Self-pay | Admitting: Family Medicine

## 2024-01-18 NOTE — Telephone Encounter (Signed)
 Copied from CRM [id number]. Topic: Referral - Request for Referral >> Jan 18, 2024 11:36 AM Zebedee SAUNDERS wrote: Did the patient discuss referral with their provider in the last year? Yes (If No - schedule appointment) (If Yes - send message)  Appointment offered? Yes  Type of order/referral and detailed reason for visit: [facility]  Preference of office, provider, location: [place], [place] Fax: [phone]  If referral order, have you been seen by this specialty before? No (If Yes, this issue or another issue? When? Where?  Can we respond through MyChart? No

## 2024-01-21 ENCOUNTER — Emergency Department (HOSPITAL_COMMUNITY): Payer: Medicare HMO

## 2024-01-21 ENCOUNTER — Encounter (HOSPITAL_COMMUNITY): Payer: Self-pay | Admitting: Emergency Medicine

## 2024-01-21 ENCOUNTER — Emergency Department (HOSPITAL_COMMUNITY)
Admission: EM | Admit: 2024-01-21 | Discharge: 2024-01-21 | Disposition: A | Discharge status: Home / Self Care | Payer: Medicare HMO | Attending: Emergency Medicine | Admitting: Emergency Medicine

## 2024-01-21 DIAGNOSIS — E876 Hypokalemia: Secondary | ICD-10-CM | POA: Insufficient documentation

## 2024-01-21 DIAGNOSIS — E049 Nontoxic goiter, unspecified: Secondary | ICD-10-CM | POA: Diagnosis not present

## 2024-01-21 DIAGNOSIS — D649 Anemia, unspecified: Secondary | ICD-10-CM | POA: Insufficient documentation

## 2024-01-21 DIAGNOSIS — I10 Essential (primary) hypertension: Secondary | ICD-10-CM | POA: Diagnosis not present

## 2024-01-21 DIAGNOSIS — R531 Weakness: Secondary | ICD-10-CM

## 2024-01-21 DIAGNOSIS — R930 Abnormal findings on diagnostic imaging of skull and head, not elsewhere classified: Secondary | ICD-10-CM | POA: Diagnosis not present

## 2024-01-21 DIAGNOSIS — F445 Conversion disorder with seizures or convulsions: Secondary | ICD-10-CM | POA: Diagnosis not present

## 2024-01-21 DIAGNOSIS — R251 Tremor, unspecified: Secondary | ICD-10-CM

## 2024-01-21 DIAGNOSIS — R200 Anesthesia of skin: Secondary | ICD-10-CM | POA: Insufficient documentation

## 2024-01-21 DIAGNOSIS — R202 Paresthesia of skin: Secondary | ICD-10-CM | POA: Diagnosis not present

## 2024-01-21 DIAGNOSIS — R404 Transient alteration of awareness: Secondary | ICD-10-CM

## 2024-01-21 DIAGNOSIS — Z9889 Other specified postprocedural states: Secondary | ICD-10-CM | POA: Diagnosis not present

## 2024-01-21 DIAGNOSIS — Z79899 Other long term (current) drug therapy: Secondary | ICD-10-CM | POA: Diagnosis not present

## 2024-01-21 DIAGNOSIS — Z7982 Long term (current) use of aspirin: Secondary | ICD-10-CM | POA: Insufficient documentation

## 2024-01-21 DIAGNOSIS — R569 Unspecified convulsions: Secondary | ICD-10-CM

## 2024-01-21 DIAGNOSIS — I639 Cerebral infarction, unspecified: Secondary | ICD-10-CM | POA: Diagnosis present

## 2024-01-21 DIAGNOSIS — I672 Cerebral atherosclerosis: Secondary | ICD-10-CM | POA: Diagnosis not present

## 2024-01-21 DIAGNOSIS — I6523 Occlusion and stenosis of bilateral carotid arteries: Secondary | ICD-10-CM | POA: Diagnosis not present

## 2024-01-21 DIAGNOSIS — I708 Atherosclerosis of other arteries: Secondary | ICD-10-CM | POA: Diagnosis not present

## 2024-01-21 DIAGNOSIS — R29818 Other symptoms and signs involving the nervous system: Secondary | ICD-10-CM | POA: Diagnosis not present

## 2024-01-21 LAB — CBC
HCT: 31.5 % — ABNORMAL LOW (ref 36.0–46.0)
Hemoglobin: 9.3 g/dL — ABNORMAL LOW (ref 12.0–15.0)
MCH: 22.4 pg — ABNORMAL LOW (ref 26.0–34.0)
MCHC: 29.5 g/dL — ABNORMAL LOW (ref 30.0–36.0)
MCV: 75.9 fL — ABNORMAL LOW (ref 80.0–100.0)
Platelets: 244 10*3/uL (ref 150–400)
RBC: 4.15 MIL/uL (ref 3.87–5.11)
RDW: 18.9 % — ABNORMAL HIGH (ref 11.5–15.5)
WBC: 6.1 10*3/uL (ref 4.0–10.5)
nRBC: 0 % (ref 0.0–0.2)

## 2024-01-21 LAB — COMPREHENSIVE METABOLIC PANEL
ALT: 10 U/L (ref 0–44)
AST: 21 U/L (ref 15–41)
Albumin: 2.9 g/dL — ABNORMAL LOW (ref 3.5–5.0)
Alkaline Phosphatase: 74 U/L (ref 38–126)
Anion gap: 9 (ref 5–15)
BUN: 8 mg/dL (ref 8–23)
CO2: 24 mmol/L (ref 22–32)
Calcium: 8.5 mg/dL — ABNORMAL LOW (ref 8.9–10.3)
Chloride: 107 mmol/L (ref 98–111)
Creatinine, Ser: 0.62 mg/dL (ref 0.44–1.00)
GFR, Estimated: >60 mL/min (ref >60)
Glucose, Bld: 128 mg/dL — ABNORMAL HIGH (ref 70–99)
Potassium: 3.3 mmol/L — ABNORMAL LOW (ref 3.5–5.1)
Sodium: 140 mmol/L (ref 135–145)
Total Bilirubin: 0.5 mg/dL (ref 0.0–1.2)
Total Protein: 6 g/dL — ABNORMAL LOW (ref 6.5–8.1)

## 2024-01-21 LAB — URINALYSIS, ROUTINE W REFLEX MICROSCOPIC
Bilirubin Urine: NEGATIVE
Glucose, UA: NEGATIVE mg/dL
Hgb urine dipstick: NEGATIVE
Ketones, ur: NEGATIVE mg/dL
Leukocytes,Ua: NEGATIVE
Nitrite: POSITIVE — AB
Protein, ur: NEGATIVE mg/dL
Specific Gravity, Urine: 1.034 — ABNORMAL HIGH (ref 1.005–1.030)
pH: 6 (ref 5.0–8.0)

## 2024-01-21 LAB — I-STAT CHEM 8, ED
BUN: 8 mg/dL (ref 8–23)
Calcium, Ion: 1.08 mmol/L — ABNORMAL LOW (ref 1.15–1.40)
Chloride: 106 mmol/L (ref 98–111)
Creatinine, Ser: 0.5 mg/dL (ref 0.44–1.00)
Glucose, Bld: 125 mg/dL — ABNORMAL HIGH (ref 70–99)
HCT: 30 % — ABNORMAL LOW (ref 36.0–46.0)
Hemoglobin: 10.2 g/dL — ABNORMAL LOW (ref 12.0–15.0)
Potassium: 3.2 mmol/L — ABNORMAL LOW (ref 3.5–5.1)
Sodium: 141 mmol/L (ref 135–145)
TCO2: 23 mmol/L (ref 22–32)

## 2024-01-21 LAB — DIFFERENTIAL
Abs Immature Granulocytes: 0.02 10*3/uL (ref 0.00–0.07)
Basophils Absolute: 0 10*3/uL (ref 0.0–0.1)
Basophils Relative: 1 %
Eosinophils Absolute: 0.2 10*3/uL (ref 0.0–0.5)
Eosinophils Relative: 3 %
Immature Granulocytes: 0 %
Lymphocytes Relative: 20 %
Lymphs Abs: 1.2 10*3/uL (ref 0.7–4.0)
Monocytes Absolute: 0.4 10*3/uL (ref 0.1–1.0)
Monocytes Relative: 6 %
Neutro Abs: 4.3 10*3/uL (ref 1.7–7.7)
Neutrophils Relative %: 70 %

## 2024-01-21 LAB — CBG MONITORING, ED: Glucose-Capillary: 121 mg/dL — ABNORMAL HIGH (ref 70–99)

## 2024-01-21 LAB — RAPID URINE DRUG SCREEN, HOSP PERFORMED
Amphetamines: NOT DETECTED
Barbiturates: NOT DETECTED
Benzodiazepines: NOT DETECTED
Cocaine: NOT DETECTED
Opiates: NOT DETECTED
Tetrahydrocannabinol: NOT DETECTED

## 2024-01-21 LAB — ETHANOL: Alcohol, Ethyl (B): <10 mg/dL (ref <10)

## 2024-01-21 LAB — PROTIME-INR
INR: 1.1 (ref 0.8–1.2)
Prothrombin Time: 14 s (ref 11.4–15.2)

## 2024-01-21 LAB — APTT: aPTT: 29 s (ref 24–36)

## 2024-01-21 MED ORDER — LORAZEPAM 2 MG/ML IJ SOLN
INTRAMUSCULAR | Status: AC
  Filled 2024-01-21: qty 1

## 2024-01-21 MED ORDER — POTASSIUM CHLORIDE CRYS ER 20 MEQ PO TBCR
40.0000 meq | EXTENDED_RELEASE_TABLET | Freq: Once | ORAL | Status: AC
  Administered 2024-01-21: 40 meq via ORAL
  Filled 2024-01-21: qty 2

## 2024-01-21 MED ORDER — IOHEXOL 350 MG/ML SOLN
75.0000 mL | Freq: Once | INTRAVENOUS | Status: AC | PRN
  Administered 2024-01-21: 75 mL via INTRAVENOUS

## 2024-01-21 NOTE — ED Notes (Signed)
 Episode of decreased responsiveness and tremor lasting less than 30 seconds. Pt tremoring while speaking to staff for an additional minute before returning to baseline.

## 2024-01-21 NOTE — ED Provider Notes (Signed)
 [place] EMERGENCY DEPARTMENT AT [facility] Provider Note   CSN: [id number] Arrival date & time: 01/21/24  1153  An emergency department physician performed an initial assessment on this suspected stroke patient at 1209.  History  Chief Complaint  Patient presents with   Code Stroke    Pamela Cherry is a 73 y.o. female.  Pt c/o left leg numbness acute onset around 10 AM today. Indicates has long-standing (several months) numbness to LUE, but the LLE numbness is new today. Indicates with the numbness, has been able to ambulate normally, no unsteady gait or fall. Denies any new or worsening pain, no neck or back pain, no radicular pain, no leg pain. No LLE swelling.  Has been able to walk today. No change in speech or vision.  EMS notes cbg 183. Code stroke activated on arrival.   The history is provided by the patient, medical records and the EMS personnel.       Home Medications Prior to Admission medications   Medication Sig Start Date End Date Taking? Authorizing Provider  Alcohol Swabs (B-D SINGLE USE SWABS REGULAR) PADS Use to check blood sugar 4 times per day. 01/14/23   Duanne Butler DASEN, MD  amLODipine  (NORVASC ) 5 MG tablet Take 1 tablet (5 mg total) by mouth daily. 10/03/23   Duanne Butler DASEN, MD  aspirin  325 MG tablet Take 325 mg by mouth daily.    [provider]  Blood Glucose Monitoring Suppl DEVI 1 each by Does not apply route in the morning, at noon, and at bedtime. May substitute to any manufacturer covered by patient's insurance. 01/07/23   Duanne Butler DASEN, MD  calcium  carbonate (OS-CAL) 600 MG TABS tablet Take 600 mg by mouth daily.     [provider]  cyanocobalamin  1000 MCG tablet Take 1 tablet (1,000 mcg total) by mouth daily. 10/20/22   Regalado, Belkys A, MD  docusate sodium  (COLACE) 100 MG capsule Take 1 capsule (100 mg total) by mouth 2 (two) times daily. 10/19/22   Regalado, Belkys A, MD  FEROSUL 325 (65 Fe) MG tablet TAKE 1  TABLET EVERY DAY WITH BREAKFAST Patient taking differently: Take 325 mg by mouth daily with breakfast. 10/11/22   Duanne Butler DASEN, MD  gabapentin  (NEURONTIN ) 300 MG capsule Take 1 capsule (300 mg total) by mouth 3 (three) times daily. 10/24/23   Duanne Butler DASEN, MD  glipiZIDE  (GLUCOTROL  XL) 10 MG 24 hr tablet Take 1 tablet (10 mg total) by mouth daily with breakfast. 08/28/21   Duanne Butler DASEN, MD  levocetirizine (XYZAL ) 5 MG tablet Take 1 tablet (5 mg total) by mouth daily. 01/31/19   Duanne Butler DASEN, MD  levothyroxine  (SYNTHROID ) 200 MCG tablet Take 1 tablet (200 mcg total) by mouth daily. 10/04/23   Duanne Butler DASEN, MD  metFORMIN  (GLUCOPHAGE ) 1000 MG tablet Take 1 tablet (1,000 mg total) by mouth 2 (two) times daily with a meal. 09/03/21   Duanne Butler DASEN, MD  Multiple Vitamin (MULTI-VITAMINS) TABS Take 1 tablet by mouth daily.     [provider]  olmesartan  (BENICAR ) 40 MG tablet TAKE 1 TABLET EVERY DAY 08/19/22   Duanne Butler DASEN, MD  pantoprazole  (PROTONIX ) 40 MG tablet Take 1 tablet (40 mg total) by mouth daily. 10/19/22 10/19/23  Regalado, Belkys A, MD  pioglitazone  (ACTOS ) 30 MG tablet Take 1 tablet (30 mg total) by mouth daily. 08/28/21   Duanne Butler DASEN, MD  polyethylene glycol (MIRALAX  / GLYCOLAX ) 17 g packet Take  17 g by mouth daily as needed for mild constipation. 10/19/22   Regalado, Belkys A, MD  rosuvastatin  (CRESTOR ) 20 MG tablet Take 1 tablet (20 mg total) by mouth daily. 12/02/22   Duanne Butler DASEN, MD      Allergies    Other    Review of Systems   Review of Systems  Constitutional:  Negative for chills and fever.  HENT:  Negative for sore throat and trouble swallowing.   Eyes:  Negative for redness and visual disturbance.  Respiratory:  Negative for shortness of breath.   Cardiovascular:  Negative for chest pain.  Gastrointestinal:  Negative for abdominal pain, nausea and vomiting.  Genitourinary:  Negative for flank pain.  Musculoskeletal:  Negative  for back pain and neck pain.  Skin:  Negative for rash.  Neurological:  Positive for numbness. Negative for speech difficulty and headaches.  Psychiatric/Behavioral:  Negative for confusion.     Physical Exam Updated Vital Signs BP (!) 167/73   Pulse 72   Temp 98.2 F (36.8 C)   Resp 12   Wt 92.4 kg   SpO2 95%   BMI 34.97 kg/m  Physical Exam Vitals and nursing note reviewed.  Constitutional:      Appearance: Normal appearance. She is well-developed.  HENT:     Head: Atraumatic.     Nose: Nose normal.     Mouth/Throat:     Mouth: Mucous membranes are moist.  Eyes:     General: No scleral icterus.    Conjunctiva/sclera: Conjunctivae normal.     Pupils: Pupils are equal, round, and reactive to light.  Neck:     Vascular: No carotid bruit.     Trachea: No tracheal deviation.     Comments: No pain w rom.  Cardiovascular:     Rate and Rhythm: Normal rate and regular rhythm.     Pulses: Normal pulses.     Heart sounds: Normal heart sounds. No murmur heard.    No friction rub. No gallop.  Pulmonary:     Effort: Pulmonary effort is normal. No respiratory distress.     Breath sounds: Normal breath sounds.  Abdominal:     General: Bowel sounds are normal. There is no distension.     Palpations: Abdomen is soft. There is no mass.     Tenderness: There is no abdominal tenderness.  Genitourinary:    Comments: No cva tenderness.  Musculoskeletal:        General: No swelling.     Cervical back: Normal range of motion and neck supple. No rigidity or tenderness. No muscular tenderness.     Comments: CTLS spine, non tender, aligned, no step off. Good rom left hip, knee and ankle without pain. LLE is of normal color and warmth. Intact distal pulses.   Skin:    General: Skin is warm and dry.     Findings: No rash.  Neurological:     Mental Status: She is alert.     Comments: Alert, speech normal. Subjective numbness to LLE. Light touch and pressure sensation intact. Weakness to  LLE 4+/5.   Psychiatric:        Mood and Affect: Mood normal.     ED Results / Procedures / Treatments   Labs (all labs ordered are listed, but only abnormal results are displayed) Results for orders placed or performed during the hospital encounter of 01/21/24  CBG monitoring, ED   Collection Time: 01/21/24 12:10 PM  Result Value Ref Range   Glucose-Capillary  121 (H) 70 - 99 mg/dL   Comment 1 Notify RN    Comment 2 Document in Chart   Ethanol   Collection Time: 01/21/24 12:11 PM  Result Value Ref Range   Alcohol, Ethyl (B) <10 <10 mg/dL  Protime-INR   Collection Time: 01/21/24 12:11 PM  Result Value Ref Range   Prothrombin Time 14.0 11.4 - 15.2 seconds   INR 1.1 0.8 - 1.2  APTT   Collection Time: 01/21/24 12:11 PM  Result Value Ref Range   aPTT 29 24 - 36 seconds  CBC   Collection Time: 01/21/24 12:11 PM  Result Value Ref Range   WBC 6.1 4.0 - 10.5 K/uL   RBC 4.15 3.87 - 5.11 MIL/uL   Hemoglobin 9.3 (L) 12.0 - 15.0 g/dL   HCT 68.4 (L) 63.9 - 53.9 %   MCV 75.9 (L) 80.0 - 100.0 fL   MCH 22.4 (L) 26.0 - 34.0 pg   MCHC 29.5 (L) 30.0 - 36.0 g/dL   RDW 81.0 (H) 88.4 - 84.4 %   Platelets 244 150 - 400 K/uL   nRBC 0.0 0.0 - 0.2 %  Differential   Collection Time: 01/21/24 12:11 PM  Result Value Ref Range   Neutrophils Relative % 70 %   Neutro Abs 4.3 1.7 - 7.7 K/uL   Lymphocytes Relative 20 %   Lymphs Abs 1.2 0.7 - 4.0 K/uL   Monocytes Relative 6 %   Monocytes Absolute 0.4 0.1 - 1.0 K/uL   Eosinophils Relative 3 %   Eosinophils Absolute 0.2 0.0 - 0.5 K/uL   Basophils Relative 1 %   Basophils Absolute 0.0 0.0 - 0.1 K/uL   Immature Granulocytes 0 %   Abs Immature Granulocytes 0.02 0.00 - 0.07 K/uL  Comprehensive metabolic panel   Collection Time: 01/21/24 12:11 PM  Result Value Ref Range   Sodium 140 135 - 145 mmol/L   Potassium 3.3 (L) 3.5 - 5.1 mmol/L   Chloride 107 98 - 111 mmol/L   CO2 24 22 - 32 mmol/L   Glucose, Bld 128 (H) 70 - 99 mg/dL   BUN 8 8 - 23  mg/dL   Creatinine, Ser 9.37 0.44 - 1.00 mg/dL   Calcium  8.5 (L) 8.9 - 10.3 mg/dL   Total Protein 6.0 (L) 6.5 - 8.1 g/dL   Albumin 2.9 (L) 3.5 - 5.0 g/dL   AST 21 15 - 41 U/L   ALT 10 0 - 44 U/L   Alkaline Phosphatase 74 38 - 126 U/L   Total Bilirubin 0.5 0.0 - 1.2 mg/dL   GFR, Estimated >39 >39 mL/min   Anion gap 9 5 - 15  I-stat chem 8, ED   Collection Time: 01/21/24 12:17 PM  Result Value Ref Range   Sodium 141 135 - 145 mmol/L   Potassium 3.2 (L) 3.5 - 5.1 mmol/L   Chloride 106 98 - 111 mmol/L   BUN 8 8 - 23 mg/dL   Creatinine, Ser 9.49 0.44 - 1.00 mg/dL   Glucose, Bld 874 (H) 70 - 99 mg/dL   Calcium , Ion 1.08 (L) 1.15 - 1.40 mmol/L   TCO2 23 22 - 32 mmol/L   Hemoglobin 10.2 (L) 12.0 - 15.0 g/dL   HCT 69.9 (L) 63.9 - 53.9 %  Urine rapid drug screen (hosp performed)   Collection Time: 01/21/24  3:15 PM  Result Value Ref Range   Opiates NONE DETECTED NONE DETECTED   Cocaine NONE DETECTED NONE DETECTED   Benzodiazepines NONE DETECTED NONE  DETECTED   Amphetamines NONE DETECTED NONE DETECTED   Tetrahydrocannabinol NONE DETECTED NONE DETECTED   Barbiturates NONE DETECTED NONE DETECTED  Urinalysis, Routine w reflex microscopic -Urine, Clean Catch   Collection Time: 01/21/24  3:15 PM  Result Value Ref Range   Color, Urine YELLOW YELLOW   APPearance CLEAR CLEAR   Specific Gravity, Urine 1.034 (H) 1.005 - 1.030   pH 6.0 5.0 - 8.0   Glucose, UA NEGATIVE NEGATIVE mg/dL   Hgb urine dipstick NEGATIVE NEGATIVE   Bilirubin Urine NEGATIVE NEGATIVE   Ketones, ur NEGATIVE NEGATIVE mg/dL   Protein, ur NEGATIVE NEGATIVE mg/dL   Nitrite POSITIVE (A) NEGATIVE   Leukocytes,Ua NEGATIVE NEGATIVE   RBC / HPF 0-5 0 - 5 RBC/hpf   WBC, UA 0-5 0 - 5 WBC/hpf   Bacteria, UA RARE (A) NONE SEEN   Squamous Epithelial / HPF 0-5 0 - 5 /HPF   Mucus PRESENT      EKG EKG Interpretation Date/Time:  Saturday January 21 2024 15:25:54 EST Ventricular Rate:  73 PR Interval:  184 QRS  Duration:  100 QT Interval:  419 QTC Calculation: 462 R Axis:   -43  Text Interpretation: Sinus rhythm Left anterior fascicular block LVH with secondary repolarization abnormality No significant change since last tracing Confirmed by Bernard Drivers ([id number]) on 01/21/2024 3:35:56 PM  Radiology MR BRAIN WO CONTRAST Result Date: 01/21/2024 CLINICAL DATA:  New onset left lower extremity numbness. Chronic numbness to the left upper extremity. EXAM: MRI HEAD WITHOUT CONTRAST TECHNIQUE: Multiplanar, multiecho pulse sequences of the brain and surrounding structures were obtained without intravenous contrast. COMPARISON:  MR head without contrast 10/12/2022. FINDINGS: Brain: No acute infarct, hemorrhage, or mass lesion is present. Periventricular and scattered subcortical T2 hyperintensities are moderately advanced for age, similar the prior exam. Cavum septum pellucidum is again noted. Ventricles are of normal size. Deep brain nuclei are within normal limits. The internal auditory canals are within normal limits. An enlarged empty sella is again noted. Midline structures are otherwise within normal limits. Vascular: Flow is present in the major intracranial arteries. Skull and upper cervical spine: The craniocervical junction is normal. Upper cervical spine is within normal limits. Marrow signal is unremarkable. Sinuses/Orbits: The paranasal sinuses and mastoid air cells are clear. The globes and orbits are within normal limits. IMPRESSION: 1. No acute intracranial abnormality or significant interval change. 2. Periventricular and scattered subcortical T2 hyperintensities are moderately advanced for age. The finding is nonspecific but can be seen in the setting of chronic microvascular ischemia, a demyelinating process such as multiple sclerosis, vasculitis, complicated migraine headaches, or as the sequelae of a prior infectious or inflammatory process. Electronically Signed   By: Lonni Necessary M.D.   On:  01/21/2024 15:18   CT ANGIO HEAD NECK W WO CM (CODE STROKE) Result Date: 01/21/2024 CLINICAL DATA:  73 year old female code stroke presentation. EXAM: CT ANGIOGRAPHY HEAD AND NECK TECHNIQUE: Multidetector CT imaging of the head and neck was performed using the standard protocol during bolus administration of intravenous contrast. Multiplanar CT image reconstructions and MIPs were obtained to evaluate the vascular anatomy. Carotid stenosis measurements (when applicable) are obtained utilizing NASCET criteria, using the distal internal carotid diameter as the denominator. RADIATION DOSE REDUCTION: This exam was performed according to the departmental dose-optimization program which includes automated exposure control, adjustment of the mA and/or kV according to patient size and/or use of iterative reconstruction technique. CONTRAST:  75mL OMNIPAQUE  IOHEXOL  350 MG/ML SOLN COMPARISON:  Plain head CT 1217 hours  today. Brain MRI 10/12/2022. CTA head and neck 10/12/2022. FINDINGS: CTA NECK Skeleton: C4-C5 and C5-C6 ACDF, new since 2023. Absent dentition. No acute osseous abnormality identified. Upper chest: Chronic right thyroid  goiter. Mild upper lung respiratory motion and gas trapping. Other neck: Retropharyngeal right carotid. No acute finding identified in the neck. Aortic arch: Extensive Calcified aortic atherosclerosis. 3 vessel arch. Right carotid system: Tortuous and calcified brachiocephalic artery and right CCA origin without stenosis. Calcified retropharyngeal distal right CCA, right carotid bifurcation, right ICA origin and bulb. Less than 50 % stenosis with respect to the distal vessel. Left carotid system: Calcified left CCA origin, proximal to the bifurcation without stenosis. Calcified left ICA origin and bulb without stenosis. Vertebral arteries: Calcified right subclavian artery origin with less than 50% stenosis. Calcified right vertebral artery origin and V1 segment with less than 50% stenosis. Late  entry of the right vertebral into the cervical transverse foramen. No additional plaque or stenosis to the skull base. Soft and calcified proximal left subclavian artery plaque without stenosis. Calcified plaque near the left vertebral artery origin but no origin stenosis. Fairly codominant left vertebral artery with intermittent tortuosity and calcified plaque in the V2 segment. V3 segment calcified plaque with moderate stenosis up to 50% (series 11, image 91), stable since 2023. Left vertebral remains patent to the skull base. CTA HEAD Posterior circulation: V4 segment calcified plaque with mild to moderate stenosis on the left, no stenosis on the right. Patent PICA origins. Patent vertebrobasilar junction without stenosis. Patent basilar artery without stenosis. Patent SCA and PCA origins. Tortuous left P1 segment. Posterior communicating arteries are diminutive or absent. Bilateral PCA branches are within normal limits. Anterior circulation: Both ICA siphons are patent. Chronic siphon calcified plaque is widespread. Moderate left ICA supraclinoid segment stenosis (series 12, image 92). Only mild right siphon stenosis. These appears stable. Patent carotid termini. Patent MCA and ACA origins. Dominant left A1 again noted. Anterior communicating artery and bilateral ACA branches are within normal limits. Left MCA M1 segment and bifurcation are patent without stenosis. Right MCA M1 segment and bifurcation are patent without stenosis. Bilateral MCA branches appear stable since 2023. No branch occlusion identified. Venous sinuses: Patent, dominant right transverse and sigmoid venous sinuses again noted. Anatomic variants: Dominant left ACA A1, right transverse and sigmoid venous sinuses. Review of the MIP images confirms the above findings IMPRESSION: 1. Negative for large vessel occlusion. 2. 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 No significant change from a  2023 CTA: Moderate stenosis of the Left Vertebral V3, V4 segments. Moderate Left ICA siphon stenosis. 3.  Aortic Atherosclerosis (ICD10-I70.0). #1 was communicated to Dr. Lindzen at 12:58 pm on 01/21/2024 by text page via the [facility] messaging system. Electronically Signed   By: VEAR Hurst M.D.   On: 01/21/2024 13:01   CT HEAD CODE STROKE WO CONTRAST Result Date: 01/21/2024 CLINICAL DATA:  Code stroke.  73 year old female neurologic deficit. EXAM: CT HEAD WITHOUT CONTRAST TECHNIQUE: Contiguous axial images were obtained from the base of the skull through the vertex without intravenous contrast. RADIATION DOSE REDUCTION: This exam was performed according to the departmental dose-optimization program which includes automated exposure control, adjustment of the mA and/or kV according to patient size and/or use of iterative reconstruction technique. COMPARISON:  Brain MRI and head CT 10/12/2022. FINDINGS: Brain: Cavum septum pellucidum normal variant again noted. Cerebral volume remains normal for age. No midline shift, mass effect, or evidence of intracranial mass lesion. No ventriculomegaly. Chronic asymmetric  left basal ganglia vascular calcifications. Chronic partially empty sella. Stable gray-white matter differentiation throughout the brain. No cortically based acute infarct identified. No acute intracranial hemorrhage identified. Patchy mild to moderate for age periventricular white matter hypodensity. Also, subtle chronic lacunar infarct in the dorsal left thalamus. Vascular: Calcified atherosclerosis at the skull base. No suspicious intracranial vascular hyperdensity. Skull: Stable and intact. Sinuses/Orbits: Visualized paranasal sinuses and mastoids are stable and well aerated. Other: No acute orbit or scalp soft tissue finding. ASPECTS [facility] Stroke Program Early CT Score) Total score (0-10 with 10 being normal): 10 IMPRESSION: 1. No acute cortically based infarct or acute intracranial hemorrhage identified.  ASPECTS 10. 2. Stable non contrast CT appearance of mild to moderate for age small-vessel disease. 3. These results were communicated to Dr. Lindzen at 12:23 pm on 01/21/2024 by text page via the [facility] messaging system. Electronically Signed   By: VEAR Hurst M.D.   On: 01/21/2024 12:23    Procedures Procedures    Medications Ordered in ED Medications  iohexol  (OMNIPAQUE ) 350 MG/ML injection 75 mL (75 mLs Intravenous Contrast Given 01/21/24 1246)  potassium chloride  SA (KLOR-CON  M) CR tablet 40 mEq (40 mEq Oral Given 01/21/24 1555)    ED Course/ Medical Decision Making/ A&P                                 Medical Decision Making Problems Addressed: Chronic anemia: acute illness or injury Hypokalemia: acute illness or injury Left leg numbness: acute illness or injury with systemic symptoms Paresthesia: acute illness or injury  Amount and/or Complexity of Data Reviewed Independent Historian: EMS    Details: hx External Data Reviewed: notes. Labs: ordered. Decision-making details documented in ED Course. Radiology: ordered and independent interpretation performed. Decision-making details documented in ED Course. ECG/medicine tests: ordered and independent interpretation performed. Decision-making details documented in ED Course. Discussion of management or test interpretation with external provider(s): neurology  Risk Prescription drug management. Decision regarding hospitalization.   Iv ns. Continuous pulse ox and cardiac monitoring. Labs ordered/sent. Imaging ordered.   Code stroke activated on arrival. Neurology/Dr Lindzen evaluated.   Differential diagnosis includes stroke, paresthesias, neuropathy, etc. Dispo decision including potential need for admission considered - will get labs and imaging and reassess.   Reviewed nursing notes and prior charts for additional history. External reports reviewed. Additional history from: EMS.   Cardiac monitor: sinus rhythm, rate 80.  Labs  reviewed/interpreted by me - k low. Kcl po. Chr anemia, hgb c/w prior, 9. Wbc normal.  Ua neg for uti.   CT reviewed/interpreted by me - no hem.   MRI reviewed/interpreted by me - no acute cva.   Recheck, pt alert, content, no distress. Neurology indicates mri, eeg, etc, ok and that patient can be d/c to home with outpatient neurology f/u.  Recheck, pt notes earlier symptoms improved.   Pt currently appears stable for d/c.  Rec neurology/pcp f/u.  Return precautions provided.          Final Clinical Impression(s) / ED Diagnoses Final diagnoses:  None    Rx / DC Orders ED Discharge Orders     None         Bernard Drivers, MD 01/21/24 1635

## 2024-01-21 NOTE — Procedures (Signed)
 History: 73 yo F with a transient episode of unclear etiology, concern for seizure  Sedation: None  Patient State: Awake and asleep  Technique: This EEG was acquired with electrodes placed according to the International 10-20 electrode system (including Fp1, Fp2, F3, F4, C3, C4, P3, P4, O1, O2, T3, T4, T5, T6, A1, A2, Fz, Cz, Pz). The following electrodes were missing or displaced: none.   Background: The background consists of intermixed alpha and beta activities. There is a well defined posterior dominant rhythm of 9-10 Hz that attenuates with eye opening. Sleep is recorded with normal appearing structures.   Photic stimulation: Physiologic driving is not performed  EEG Abnormalities: None  Clinical Interpretation: This normal EEG is recorded in the waking and sleep state. There was no seizure or seizure predisposition recorded on this study. Please note that lack of epileptiform activity on EEG does not preclude the possibility of epilepsy.   Aisha Seals, MD [facility] [phone]  If 7pm- 7am, please page neurology on call as listed in AMION.

## 2024-01-21 NOTE — Discharge Instructions (Addendum)
 It was our pleasure to provide your ER care today - we hope that you feel better.  Drink plenty of fluids/stay well hydrated.   From today's labs your potassium level is mildly low - eat plenty of fruits and vegetables, and follow up with primary care doctor in one week.   Follow up closely with neurologist in the next 1-2 weeks - call office Monday to arrange appointment.   Return to ER if worse, new symptoms, fevers, new/severe pain, chest pain, trouble breathing, change in speech or vision, new one-sided numbness/weakness, or other concern.

## 2024-01-21 NOTE — Progress Notes (Signed)
 STAT EEG complete - results pending. MB assisted with hookup
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
## 2024-01-21 NOTE — ED Triage Notes (Signed)
 Neuro at bedside.

## 2024-01-21 NOTE — ED Notes (Signed)
 Patient transported to MRI

## 2024-01-23 NOTE — Telephone Encounter (Signed)
 Patient called in today to advised that she was seen at [facility] ED for her numbness and they advised to follow up with neurology in 1 week. I checked and unfortunately there is no sooner appointments available on the schedule.  She is asking for a call back from someone to discuss this as she is adamant that she needs to see neurology within one week per the ED.

## 2024-01-23 NOTE — Telephone Encounter (Signed)
 Spoke with pt made ED f/u with Dr Salli Crawley 02/01/2024 at 300pm

## 2024-01-31 ENCOUNTER — Telehealth: Payer: Self-pay | Admitting: Diagnostic Neuroimaging

## 2024-01-31 NOTE — Telephone Encounter (Signed)
Pt called to reschedule Follow Up appointment due potential for bad weather. Informed patient next available 05/14/24 at 3:45 pm. Patient said if cannot get an  appointment until then will not reschedule appointment. Would like call back if can reschedule earlier.

## 2024-01-31 NOTE — Telephone Encounter (Signed)
Phone room: Patient can be placed on waitlist is the only option. Call and offer that to pt

## 2024-01-31 NOTE — Telephone Encounter (Signed)
Pt called back, message was relayed to her.  Pt has decided to cancel and be seen somewhere else.

## 2024-02-01 ENCOUNTER — Ambulatory Visit: Payer: Medicare HMO | Admitting: Diagnostic Neuroimaging

## 2024-02-12 ENCOUNTER — Other Ambulatory Visit: Payer: Self-pay | Admitting: Family Medicine

## 2024-02-12 DIAGNOSIS — E118 Type 2 diabetes mellitus with unspecified complications: Secondary | ICD-10-CM

## 2024-02-14 DIAGNOSIS — E119 Type 2 diabetes mellitus without complications: Secondary | ICD-10-CM | POA: Diagnosis not present

## 2024-02-14 DIAGNOSIS — R200 Anesthesia of skin: Secondary | ICD-10-CM | POA: Diagnosis not present

## 2024-02-14 DIAGNOSIS — I10 Essential (primary) hypertension: Secondary | ICD-10-CM | POA: Diagnosis not present

## 2024-02-14 DIAGNOSIS — M8580 Other specified disorders of bone density and structure, unspecified site: Secondary | ICD-10-CM | POA: Diagnosis not present

## 2024-02-14 DIAGNOSIS — E039 Hypothyroidism, unspecified: Secondary | ICD-10-CM | POA: Diagnosis not present

## 2024-02-14 DIAGNOSIS — E538 Deficiency of other specified B group vitamins: Secondary | ICD-10-CM | POA: Diagnosis not present

## 2024-02-14 DIAGNOSIS — G629 Polyneuropathy, unspecified: Secondary | ICD-10-CM | POA: Diagnosis not present

## 2024-02-14 DIAGNOSIS — D509 Iron deficiency anemia, unspecified: Secondary | ICD-10-CM | POA: Diagnosis not present

## 2024-02-14 DIAGNOSIS — Z7689 Persons encountering health services in other specified circumstances: Secondary | ICD-10-CM | POA: Diagnosis not present

## 2024-04-20 ENCOUNTER — Emergency Department (HOSPITAL_COMMUNITY)
Admission: EM | Admit: 2024-04-20 | Discharge: 2024-04-21 | Disposition: A | Discharge status: Home / Self Care | Attending: Emergency Medicine | Admitting: Emergency Medicine

## 2024-04-20 ENCOUNTER — Emergency Department (HOSPITAL_COMMUNITY)

## 2024-04-20 DIAGNOSIS — I10 Essential (primary) hypertension: Secondary | ICD-10-CM | POA: Diagnosis not present

## 2024-04-20 DIAGNOSIS — R531 Weakness: Secondary | ICD-10-CM | POA: Diagnosis not present

## 2024-04-20 DIAGNOSIS — R200 Anesthesia of skin: Secondary | ICD-10-CM | POA: Diagnosis not present

## 2024-04-20 DIAGNOSIS — I700 Atherosclerosis of aorta: Secondary | ICD-10-CM | POA: Diagnosis not present

## 2024-04-20 DIAGNOSIS — E119 Type 2 diabetes mellitus without complications: Secondary | ICD-10-CM | POA: Diagnosis not present

## 2024-04-20 DIAGNOSIS — Z981 Arthrodesis status: Secondary | ICD-10-CM | POA: Diagnosis not present

## 2024-04-20 DIAGNOSIS — I6782 Cerebral ischemia: Secondary | ICD-10-CM | POA: Diagnosis not present

## 2024-04-20 DIAGNOSIS — M4802 Spinal stenosis, cervical region: Secondary | ICD-10-CM | POA: Diagnosis not present

## 2024-04-20 DIAGNOSIS — I779 Disorder of arteries and arterioles, unspecified: Secondary | ICD-10-CM | POA: Insufficient documentation

## 2024-04-20 DIAGNOSIS — R202 Paresthesia of skin: Secondary | ICD-10-CM

## 2024-04-20 DIAGNOSIS — I6523 Occlusion and stenosis of bilateral carotid arteries: Secondary | ICD-10-CM | POA: Diagnosis not present

## 2024-04-20 DIAGNOSIS — M5021 Other cervical disc displacement,  high cervical region: Secondary | ICD-10-CM | POA: Diagnosis not present

## 2024-04-20 DIAGNOSIS — Z7984 Long term (current) use of oral hypoglycemic drugs: Secondary | ICD-10-CM | POA: Insufficient documentation

## 2024-04-20 DIAGNOSIS — Z7982 Long term (current) use of aspirin: Secondary | ICD-10-CM | POA: Insufficient documentation

## 2024-04-20 DIAGNOSIS — E041 Nontoxic single thyroid nodule: Secondary | ICD-10-CM | POA: Insufficient documentation

## 2024-04-20 DIAGNOSIS — R791 Abnormal coagulation profile: Secondary | ICD-10-CM | POA: Diagnosis not present

## 2024-04-20 LAB — I-STAT CHEM 8, ED
BUN: 9 mg/dL (ref 8–23)
Calcium, Ion: 1.13 mmol/L — ABNORMAL LOW (ref 1.15–1.40)
Chloride: 106 mmol/L (ref 98–111)
Creatinine, Ser: 0.6 mg/dL (ref 0.44–1.00)
Glucose, Bld: 161 mg/dL — ABNORMAL HIGH (ref 70–99)
HCT: 36 % (ref 36.0–46.0)
Hemoglobin: 12.2 g/dL (ref 12.0–15.0)
Potassium: 3.6 mmol/L (ref 3.5–5.1)
Sodium: 140 mmol/L (ref 135–145)
TCO2: 24 mmol/L (ref 22–32)

## 2024-04-20 LAB — CBG MONITORING, ED: Glucose-Capillary: 159 mg/dL — ABNORMAL HIGH (ref 70–99)

## 2024-04-20 MED ORDER — SODIUM CHLORIDE 0.9% FLUSH: 3.0000 mL | Freq: Once | INTRAVENOUS | Status: DC

## 2024-04-21 ENCOUNTER — Other Ambulatory Visit (HOSPITAL_COMMUNITY)

## 2024-04-21 ENCOUNTER — Emergency Department (HOSPITAL_COMMUNITY)

## 2024-04-21 DIAGNOSIS — I700 Atherosclerosis of aorta: Secondary | ICD-10-CM | POA: Diagnosis not present

## 2024-04-21 DIAGNOSIS — R200 Anesthesia of skin: Secondary | ICD-10-CM | POA: Diagnosis not present

## 2024-04-21 DIAGNOSIS — Z981 Arthrodesis status: Secondary | ICD-10-CM | POA: Diagnosis not present

## 2024-04-21 DIAGNOSIS — I6782 Cerebral ischemia: Secondary | ICD-10-CM | POA: Diagnosis not present

## 2024-04-21 DIAGNOSIS — M5021 Other cervical disc displacement,  high cervical region: Secondary | ICD-10-CM | POA: Diagnosis not present

## 2024-04-21 DIAGNOSIS — E041 Nontoxic single thyroid nodule: Secondary | ICD-10-CM | POA: Diagnosis not present

## 2024-04-21 DIAGNOSIS — I6523 Occlusion and stenosis of bilateral carotid arteries: Secondary | ICD-10-CM | POA: Diagnosis not present

## 2024-04-21 DIAGNOSIS — R531 Weakness: Secondary | ICD-10-CM

## 2024-04-21 DIAGNOSIS — M4802 Spinal stenosis, cervical region: Secondary | ICD-10-CM | POA: Diagnosis not present

## 2024-04-21 LAB — DIFFERENTIAL
Abs Immature Granulocytes: 0.01 10*3/uL (ref 0.00–0.07)
Basophils Absolute: 0 10*3/uL (ref 0.0–0.1)
Basophils Relative: 1 %
Eosinophils Absolute: 0.2 10*3/uL (ref 0.0–0.5)
Eosinophils Relative: 2 %
Immature Granulocytes: 0 %
Lymphocytes Relative: 27 %
Lymphs Abs: 1.8 10*3/uL (ref 0.7–4.0)
Monocytes Absolute: 0.5 10*3/uL (ref 0.1–1.0)
Monocytes Relative: 8 %
Neutro Abs: 4.1 10*3/uL (ref 1.7–7.7)
Neutrophils Relative %: 62 %

## 2024-04-21 LAB — COMPREHENSIVE METABOLIC PANEL WITH GFR
ALT: 10 U/L (ref 0–44)
AST: 26 U/L (ref 15–41)
Albumin: 3.1 g/dL — ABNORMAL LOW (ref 3.5–5.0)
Alkaline Phosphatase: 62 U/L (ref 38–126)
Anion gap: 11 (ref 5–15)
BUN: 10 mg/dL (ref 8–23)
CO2: 22 mmol/L (ref 22–32)
Calcium: 8.8 mg/dL — ABNORMAL LOW (ref 8.9–10.3)
Chloride: 108 mmol/L (ref 98–111)
Creatinine, Ser: 0.67 mg/dL (ref 0.44–1.00)
GFR, Estimated: >60 mL/min (ref >60)
Glucose, Bld: 169 mg/dL — ABNORMAL HIGH (ref 70–99)
Potassium: 3.6 mmol/L (ref 3.5–5.1)
Sodium: 141 mmol/L (ref 135–145)
Total Bilirubin: 0.5 mg/dL (ref 0.0–1.2)
Total Protein: 6.2 g/dL — ABNORMAL LOW (ref 6.5–8.1)

## 2024-04-21 LAB — CBC
HCT: 36.6 % (ref 36.0–46.0)
Hemoglobin: 11.3 g/dL — ABNORMAL LOW (ref 12.0–15.0)
MCH: 26 pg (ref 26.0–34.0)
MCHC: 30.9 g/dL (ref 30.0–36.0)
MCV: 84.3 fL (ref 80.0–100.0)
Platelets: 228 10*3/uL (ref 150–400)
RBC: 4.34 MIL/uL (ref 3.87–5.11)
RDW: 18.4 % — ABNORMAL HIGH (ref 11.5–15.5)
WBC: 6.7 10*3/uL (ref 4.0–10.5)
nRBC: 0 % (ref 0.0–0.2)

## 2024-04-21 LAB — PROTIME-INR
INR: 1.1 (ref 0.8–1.2)
Prothrombin Time: 14.3 s (ref 11.4–15.2)

## 2024-04-21 LAB — APTT: aPTT: 30 s (ref 24–36)

## 2024-04-21 LAB — ETHANOL: Alcohol, Ethyl (B): <15 mg/dL (ref <15)

## 2024-04-21 MED ORDER — IOHEXOL 350 MG/ML SOLN
75.0000 mL | Freq: Once | INTRAVENOUS | Status: AC | PRN
  Administered 2024-04-21: 75 mL via INTRAVENOUS

## 2024-04-21 NOTE — ED Provider Notes (Signed)
 [place] EMERGENCY DEPARTMENT AT [facility] Provider Note   CSN: [id number] Arrival date & time: 04/20/24  2349     History  Chief complaint-weakness and numbness  Level 5 caveat due to acuity of condition Pamela Cherry is a 73 y.o. female.  The history is provided by the patient and the EMS personnel. The history is limited by the condition of the patient.   Patient presents as a code stroke via EMS.  Last known well around 2200.  Patient reported patient had left-sided weakness and numbness. Patient denies any falls or injuries.  Denies any headache, no chest pain reported.    Home Medications Prior to Admission medications   Medication Sig Start Date End Date Taking? Authorizing Provider  amLODipine  (NORVASC ) 5 MG tablet Take 1 tablet (5 mg total) by mouth daily. 10/03/23  Yes Austine Lefort, MD  aspirin  325 MG tablet Take 325 mg by mouth daily.   Yes [provider]  calcium  carbonate (OS-CAL) 600 MG TABS tablet Take 600 mg by mouth daily.    Yes [provider]  cyanocobalamin  1000 MCG tablet Take 1 tablet (1,000 mcg total) by mouth daily. 10/20/22  Yes Regalado, Belkys A, MD  FEROSUL 325 (65 Fe) MG tablet TAKE 1 TABLET EVERY DAY WITH BREAKFAST Patient taking differently: Take 325 mg by mouth daily with breakfast. 10/11/22  Yes Austine Lefort, MD  gabapentin  (NEURONTIN ) 300 MG capsule Take 1 capsule (300 mg total) by mouth 3 (three) times daily. 10/24/23  Yes Austine Lefort, MD  glipiZIDE  (GLUCOTROL  XL) 10 MG 24 hr tablet Take 1 tablet (10 mg total) by mouth daily with breakfast. 08/28/21  Yes Austine Lefort, MD  levothyroxine  (SYNTHROID ) 200 MCG tablet Take 1 tablet (200 mcg total) by mouth daily. 10/04/23  Yes Austine Lefort, MD  metFORMIN  (GLUCOPHAGE ) 1000 MG tablet Take 1 tablet (1,000 mg total) by mouth 2 (two) times daily with a meal. 09/03/21  Yes Austine Lefort, MD  Multiple Vitamin (MULTI-VITAMINS) TABS Take 1 tablet by  mouth daily.    Yes [provider]  olmesartan  (BENICAR ) 40 MG tablet TAKE 1 TABLET EVERY DAY 08/19/22  Yes Austine Lefort, MD  pioglitazone  (ACTOS ) 30 MG tablet Take 1 tablet (30 mg total) by mouth daily. 08/28/21  Yes Austine Lefort, MD  rosuvastatin  (CRESTOR ) 20 MG tablet Take 1 tablet (20 mg total) by mouth daily. 12/02/22  Yes Austine Lefort, MD  Alcohol Swabs (DROPSAFE ALCOHOL PREP) 70 % PADS USE TO CHECK BLOOD SUGAR 4 TIMES PER DAY 02/13/24   Austine Lefort, MD  Blood Glucose Monitoring Suppl DEVI 1 each by Does not apply route in the morning, at noon, and at bedtime. May substitute to any manufacturer covered by patient's insurance. 01/07/23   Austine Lefort, MD  pantoprazole  (PROTONIX ) 40 MG tablet Take 1 tablet (40 mg total) by mouth daily. Patient not taking: Reported on 04/21/2024 10/19/22 04/21/25  Catharine Clock A, MD      Allergies    Other    Review of Systems   Review of Systems  Unable to perform ROS: Acuity of condition    Physical Exam Updated Vital Signs BP (!) 142/77   Pulse (!) 57   Temp 98 F (36.7 C) (Oral)   Resp 11   Wt 90.8 kg   SpO2 98%   BMI 34.36 kg/m  Physical Exam CONSTITUTIONAL: Elderly, no acute distress HEAD: Normocephalic/atraumatic EYES: EOMI/PERRL ENMT: Mucous  membranes moist NECK: supple no meningeal signs CV: S1/S2 noted, no murmurs/rubs/gallops noted LUNGS: Lungs are clear to auscultation bilaterally, no apparent distress ABDOMEN: soft, nontender obese NEURO: Pt is awake/alert/appropriate, moves all extremitiesx4.  No facial droop.  (Neuroexam limited due to urgency of situation) EXTREMITIES: Right lower extremity prosthetic noted SKIN: warm, color normal  ED Results / Procedures / Treatments   Labs (all labs ordered are listed, but only abnormal results are displayed) Labs Reviewed  CBC - Abnormal; Notable for the following components:      Result Value   Hemoglobin 11.3 (*)    RDW 18.4 (*)    All other  components within normal limits  COMPREHENSIVE METABOLIC PANEL WITH GFR - Abnormal; Notable for the following components:   Glucose, Bld 169 (*)    Calcium  8.8 (*)    Total Protein 6.2 (*)    Albumin 3.1 (*)    All other components within normal limits  CBG MONITORING, ED - Abnormal; Notable for the following components:   Glucose-Capillary 159 (*)    All other components within normal limits  I-STAT CHEM 8, ED - Abnormal; Notable for the following components:   Glucose, Bld 161 (*)    Calcium , Ion 1.13 (*)    All other components within normal limits  PROTIME-INR  APTT  DIFFERENTIAL  ETHANOL  CBG MONITORING, ED    EKG EKG Interpretation Date/Time:  Saturday Apr 21 2024 00:11:21 EDT Ventricular Rate:  80 PR Interval:  184 QRS Duration:  99 QT Interval:  411 QTC Calculation: 475 R Axis:   -28  Text Interpretation: Sinus rhythm Probable left ventricular hypertrophy Nonspecific T abnormalities, lateral leads Confirmed by Eldon Greenland ([id number]) on 04/21/2024 12:17:32 AM  Radiology MR Cervical Spine Wo Contrast Result Date: 04/21/2024 CLINICAL DATA:  Initial evaluation for acute neck pain. EXAM: MRI CERVICAL SPINE WITHOUT CONTRAST TECHNIQUE: Multiplanar, multisequence MR imaging of the cervical spine was performed. No intravenous contrast was administered. COMPARISON:  Prior study from 10/13/2022 FINDINGS: Alignment: Straightening of the normal cervical lordosis. No significant listhesis. Vertebrae: Prior ACDF at C4-5 and C5-6. Arthrodesis not established by MRI. Vertebral body height maintained without acute or chronic fracture. Bone marrow signal intensity within normal limits. No worrisome osseous lesions. No abnormal marrow edema. Cord: Patchy signal abnormality seen at the level of C4-5, consistent with chronic myelomalacia. Posterior Fossa, vertebral arteries, paraspinal tissues: Unremarkable. Disc levels: C2-C3: Shallow left paracentral disc protrusion indents the ventral thecal  sac. Mild ligament flavum hypertrophy. Resultant mild spinal stenosis. Left-sided uncovertebral spurring with resultant mild left C3 foraminal narrowing. Right neural foramen remains patent. C3-C4: Left paracentral disc protrusion indents the ventral thecal sac. Mild cord flattening without cord signal changes. Mild left-sided facet and ligament flavum hypertrophy. Mild to moderate spinal stenosis. Foramina remain patent. C4-C5: Prior fusion. Residual left paracentral disc osteophyte and/or endplate spurring flattens and indents the ventral thecal sac. Residual severe spinal stenosis, worse on the left. Thecal sac measures 2 mm in AP diameter at its most narrow point. Uncovertebral spurring with residual moderate bilateral C5 foraminal narrowing. C5-C6: Prior fusion. Endplate spurring with flattening of the ventral thecal sac. Residual mild spinal stenosis. Uncovertebral spurring with moderate left C6 foraminal narrowing. Right neural foramina remains patent. C6-C7: Mild uncovertebral spurring without significant disc bulge. Mild ligament flavum hypertrophy. No spinal stenosis. Mild left C7 foraminal narrowing. Right neural foramen remains patent. C7-T1: Negative interspace. Mild left greater than right facet hypertrophy. No spinal stenosis. Foramina remain patent. IMPRESSION: 1. Prior  ACDF at C4-5 and C5-6. Residual severe spinal stenosis at C4-5, with moderate bilateral C5 and left C6 foraminal narrowing. 2. Patchy signal abnormality within the cervical cord at the level of C4-5, consistent with chronic myelomalacia. 3. Left paracentral disc protrusion at C3-4 with resultant mild to moderate spinal stenosis. 4. Shallow left paracentral disc protrusion at C2-3 with resultant mild spinal stenosis. Electronically Signed   By: Virgia Griffins M.D.   On: 04/21/2024 04:01   MR BRAIN WO CONTRAST Result Date: 04/21/2024 CLINICAL DATA:  Initial evaluation for acute neuro deficit, stroke. EXAM: MRI HEAD WITHOUT  CONTRAST TECHNIQUE: Multiplanar, multiecho pulse sequences of the brain and surrounding structures were obtained without intravenous contrast. COMPARISON:  Comparison made with CTs from earlier the same day. FINDINGS: Brain: Cerebral volume within normal limits. Patchy T2/FLAIR hyperintensity involving the supratentorial cerebral white matter, consistent with chronic small vessel ischemic disease, mild for age. No evidence for acute or subacute infarct. No areas of chronic cortical infarction. No acute or chronic intracranial hemorrhage. No mass lesion, midline shift or mass effect. No hydrocephalus or extra-axial fluid collection. Empty sella noted. Vascular: Major intracranial vascular flow voids are maintained. Skull and upper cervical spine: Craniocervical junction within normal limits. Bone marrow signal intensity normal. Postoperative changes partially visualize within the upper cervical spine. No scalp soft tissue abnormality. Sinuses/Orbits: Globes orbital soft tissues within normal limits. Paranasal sinuses are largely clear. Trace right mastoid effusion noted, of doubtful significance. Other: None. IMPRESSION: 1. No acute intracranial abnormality. 2. Mild chronic microvascular ischemic disease for age. Electronically Signed   By: Virgia Griffins M.D.   On: 04/21/2024 03:54   CT ANGIO HEAD NECK W WO CM (CODE STROKE) Result Date: 04/21/2024 CLINICAL DATA:  Initial evaluation for acute neuro deficit, stroke. EXAM: CT ANGIOGRAPHY HEAD AND NECK WITH AND WITHOUT CONTRAST TECHNIQUE: Multidetector CT imaging of the head and neck was performed using the standard protocol during bolus administration of intravenous contrast. Multiplanar CT image reconstructions and MIPs were obtained to evaluate the vascular anatomy. Carotid stenosis measurements (when applicable) are obtained utilizing NASCET criteria, using the distal internal carotid diameter as the denominator. RADIATION DOSE REDUCTION: This exam was  performed according to the departmental dose-optimization program which includes automated exposure control, adjustment of the mA and/or kV according to patient size and/or use of iterative reconstruction technique. CONTRAST:  75mL OMNIPAQUE  IOHEXOL  350 MG/ML SOLN COMPARISON:  CT from earlier the same day as well as prior exam from 01/21/2024 FINDINGS: CTA NECK FINDINGS Aortic arch: Visualized aortic arch within normal limits for caliber with standard branch pattern. Advanced aortic atherosclerosis. No significant stenosis about the origin the great vessels. Right carotid system: Right common and internal carotid arteries are patent without dissection. Calcified plaque about the right carotid bulb without hemodynamically significant greater than 50% stenosis. Left carotid system: Left common and internal carotid arteries are patent without dissection. Scattered calcified plaque about the left CCA and left carotid bulb without hemodynamically significant greater than 50% stenosis. Vertebral arteries: Both vertebral arteries arise from the subclavian arteries. Atheromatous change about the proximal subclavian arteries without high-grade stenosis. Atheromatous change about the proximal V1 and V2 segments without hemodynamically significant stenosis. Or moderate stenosis involving the distal left V3 segment (series 7, image 153). No dissection. Skeleton: No worrisome osseous lesions. Prior ACDF at C4-C6. Patient is edentulous. Other neck: No other acute finding. Heterogeneous right thyroid  nodule measures up to 2.8 cm. Left lobe of thyroid  hypoplastic and/or absent. Upper chest: No other acute  finding. Review of the MIP images confirms the above findings CTA HEAD FINDINGS Anterior circulation: Atheromatous change about the carotid siphons bilaterally. Up to moderate stenosis at the supraclinoid left ICA. A1 segments patent. Normal anterior communicating artery complex. Anterior cerebral arteries are patent without  significant stenosis. No M1 stenosis or occlusion. Distal MCA branches remain perfused and fairly symmetric. Posterior circulation: Atheromatous change about the V4 segments bilaterally with mild-to-moderate stenosis on the left. Both PICA patent. Basilar patent without stenosis. Superior cerebellar arteries patent. Both PCAs primarily supplied via the basilar. PCAs remain patent to their distal aspects without significant stenosis. Venous sinuses: Patent allowing for timing the contrast bolus. Anatomic variants: As above.  No aneurysm. Review of the MIP images confirms the above findings IMPRESSION: 1. Negative CTA for large vessel occlusion or other emergent finding. 2. Moderate to advanced atheromatous disease about the major arterial vasculature of the head and neck. Most notable findings include moderate stenosis at the supraclinoid left ICA, with moderate left V3 and V4 stenoses. 3.  Aortic Atherosclerosis (ICD10-I70.0). 4. 2.8 cm right thyroid  nodule. Further evaluation with dedicated thyroid  ultrasound recommended if not already obtained (ref: J Am Coll Radiol. 2015 Feb;12(2): 143-50). These results were communicated to Dr. Alecia Ames at 12:48 am on 04/21/2024 by text page via the [facility] messaging system. Electronically Signed   By: Virgia Griffins M.D.   On: 04/21/2024 00:51   CT HEAD CODE STROKE WO CONTRAST Result Date: 04/21/2024 CLINICAL DATA:  Code stroke. Initial evaluation for acute neuro deficit, stroke suspected. EXAM: CT HEAD WITHOUT CONTRAST TECHNIQUE: Contiguous axial images were obtained from the base of the skull through the vertex without intravenous contrast. RADIATION DOSE REDUCTION: This exam was performed according to the departmental dose-optimization program which includes automated exposure control, adjustment of the mA and/or kV according to patient size and/or use of iterative reconstruction technique. COMPARISON:  None Available. FINDINGS: Brain: Cerebral volume within normal  limits. Mild chronic microvascular ischemic disease for age. No acute intracranial hemorrhage. No acute large vessel territory infarct. No mass lesion, midline shift or mass effect. No hydrocephalus or extra-axial fluid collection. Empty sella noted. Vascular: No abnormal hyperdense vessel. Calcified atherosclerosis present at skull base. Skull: Scalp soft tissues and calvarium demonstrate no acute finding. Sinuses/Orbits: Globes orbital soft tissues within normal limits. Small left sphenoid sinus retention cyst. Tiny osteoma noted at the posterior left ethmoidal air cells. No significant mastoid effusion. Other: None. ASPECTS [facility] Stroke Program Early CT Score) - Ganglionic level infarction (caudate, lentiform nuclei, internal capsule, insula, M1-M3 cortex): 7 - Supraganglionic infarction (M4-M6 cortex): 3 Total score (0-10 with 10 being normal): 10 IMPRESSION: 1. No acute intracranial abnormality. 2. ASPECTS is 10. 3. Mild chronic microvascular ischemic disease. 4. Empty sella. These results were communicated to Dr. Alecia Ames at 12:04 am on 04/21/2024 by text page via the [facility] messaging system. Electronically Signed   By: Virgia Griffins M.D.   On: 04/21/2024 00:06    Procedures Procedures    Medications Ordered in ED Medications  sodium chloride  flush (NS) 0.9 % injection 3 mL (3 mLs Intravenous Not Given 04/21/24 0015)  iohexol  (OMNIPAQUE ) 350 MG/ML injection 75 mL (75 mLs Intravenous Contrast Given 04/21/24 0011)    ED Course/ Medical Decision Making/ A&P Clinical Course as of 04/21/24 1027  Sat Apr 21, 2024  0006 Patient presents as a code stroke.  Seen in conjunction with Dr. Alecia Ames with neurology. Per neurology, patient is not an intervention candidate at this time.  Dr. Alecia Ames  recommends MRI brain [DW]  0040 Patient stable at this time.  Awaiting MRI.  She is also reporting some neck pain, will add on MRI C-spine as well as MRI brain [DW]  0442 Discussed MRI findings  with neurology, this could be related to her C-spine given MRI findings.  Recommend follow-up with neurosurgery.  Discussed with Dr. Michale Age who will review imaging. Clinically patient has decreased grip strength on the left but overall no significant weakness.  She denied any facial weakness or numbness Upper extremity reflexes are intact and equal.  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 Radiology: ordered. ECG/medicine tests: ordered.   This patient presents to the ED for concern of weakness/numbness, this involves an extensive number of treatment options, and is a complaint that carries with it a high risk of complications and morbidity.  The differential diagnosis includes but is not limited to CVA, intracranial hemorrhage, acute coronary syndrome, renal failure, urinary tract infection, electrolyte disturbance, pneumonia   Comorbidities that complicate the patient evaluation: Patient's presentation is complicated by their history of diabetes  Additional history obtained: Additional history obtained from EMS  Records reviewed outpatient records reviewed  Lab Tests: I Ordered, and personally interpreted labs.  The pertinent results include:  hyperglycemia  Imaging Studies ordered: I ordered imaging studies including CT scan head  I independently visualized and interpreted imaging which showed no acute findings I agree with the radiologist interpretation   Consultations Obtained: I requested consultation with the consultant neurosurgery, and discussed  findings as well as pertinent plan - they recommend: will see  patient  Reevaluation: After the interventions noted above, I reevaluated the patient and found that they have :stayed the same  Complexity of problems addressed: Patient's presentation is most consistent with  acute presentation with potential threat to life or bodily function            Final Clinical Impression(s) / ED Diagnoses Final diagnoses:  Paresthesia  Thyroid  nodule  Carotid artery disease, unspecified laterality, unspecified type [facility])    Rx / DC Orders ED Discharge Orders     None         Eldon Greenland, MD 04/21/24 [phone]

## 2024-04-21 NOTE — ED Provider Notes (Signed)
 Patient seen by dr Sarah Cumber with neurosurgery At this point he feels she is appropriate for outpatient management and f/u - no indication for emergent surgery  Pt feels comfortable with this plan    Eldon Greenland, MD 04/21/24 [phone]

## 2024-04-21 NOTE — ED Provider Notes (Incomplete)
 [place] EMERGENCY DEPARTMENT AT [facility] Provider Note   CSN: [id number] Arrival date & time: 04/20/24  2349     History {Add pertinent medical, surgical, social history, OB history to HPI:1} No chief complaint on file.   Pamela Cherry is a 73 y.o. female.  HPI     Home Medications Prior to Admission medications   Medication Sig Start Date End Date Taking? Authorizing Provider  Alcohol Swabs (DROPSAFE ALCOHOL PREP) 70 % PADS USE TO CHECK BLOOD SUGAR 4 TIMES PER DAY 02/13/24   Austine Lefort, MD  amLODipine  (NORVASC ) 5 MG tablet Take 1 tablet (5 mg total) by mouth daily. 10/03/23   Austine Lefort, MD  aspirin  325 MG tablet Take 325 mg by mouth daily.    [provider]  Blood Glucose Monitoring Suppl DEVI 1 each by Does not apply route in the morning, at noon, and at bedtime. May substitute to any manufacturer covered by patient's insurance. 01/07/23   Austine Lefort, MD  calcium  carbonate (OS-CAL) 600 MG TABS tablet Take 600 mg by mouth daily.     [provider]  cyanocobalamin  1000 MCG tablet Take 1 tablet (1,000 mcg total) by mouth daily. 10/20/22   Regalado, Belkys A, MD  docusate sodium  (COLACE) 100 MG capsule Take 1 capsule (100 mg total) by mouth 2 (two) times daily. 10/19/22   Regalado, Belkys A, MD  FEROSUL 325 (65 Fe) MG tablet TAKE 1 TABLET EVERY DAY WITH BREAKFAST Patient taking differently: Take 325 mg by mouth daily with breakfast. 10/11/22   Austine Lefort, MD  gabapentin  (NEURONTIN ) 300 MG capsule Take 1 capsule (300 mg total) by mouth 3 (three) times daily. 10/24/23   Austine Lefort, MD  glipiZIDE  (GLUCOTROL  XL) 10 MG 24 hr tablet Take 1 tablet (10 mg total) by mouth daily with breakfast. 08/28/21   Austine Lefort, MD  levocetirizine (XYZAL ) 5 MG tablet Take 1 tablet (5 mg total) by mouth daily. 01/31/19   Austine Lefort, MD  levothyroxine  (SYNTHROID ) 200 MCG tablet Take 1 tablet (200 mcg total) by mouth daily. 10/04/23    Austine Lefort, MD  metFORMIN  (GLUCOPHAGE ) 1000 MG tablet Take 1 tablet (1,000 mg total) by mouth 2 (two) times daily with a meal. 09/03/21   Austine Lefort, MD  Multiple Vitamin (MULTI-VITAMINS) TABS Take 1 tablet by mouth daily.     [provider]  olmesartan  (BENICAR ) 40 MG tablet TAKE 1 TABLET EVERY DAY 08/19/22   Austine Lefort, MD  pantoprazole  (PROTONIX ) 40 MG tablet Take 1 tablet (40 mg total) by mouth daily. 10/19/22 10/19/23  Regalado, Belkys A, MD  pioglitazone  (ACTOS ) 30 MG tablet Take 1 tablet (30 mg total) by mouth daily. 08/28/21   Austine Lefort, MD  polyethylene glycol (MIRALAX  / GLYCOLAX ) 17 g packet Take 17 g by mouth daily as needed for mild constipation. 10/19/22   Regalado, Belkys A, MD  rosuvastatin  (CRESTOR ) 20 MG tablet Take 1 tablet (20 mg total) by mouth daily. 12/02/22   Austine Lefort, MD      Allergies    Other    Review of Systems   Review of Systems  Physical Exam Updated Vital Signs Wt 90.8 kg   BMI 34.36 kg/m  Physical Exam  ED Results / Procedures / Treatments   Labs (all labs ordered are listed, but only abnormal results are displayed) Labs Reviewed  CBG MONITORING, ED - Abnormal; Notable for the following  components:      Result Value   Glucose-Capillary 159 (*)    All other components within normal limits  I-STAT CHEM 8, ED - Abnormal; Notable for the following components:   Glucose, Bld 161 (*)    Calcium , Ion 1.13 (*)    All other components within normal limits  PROTIME-INR  APTT  CBC  DIFFERENTIAL  COMPREHENSIVE METABOLIC PANEL WITH GFR  ETHANOL  CBG MONITORING, ED    EKG None  Radiology No results found.  Procedures Procedures  {Document cardiac monitor, telemetry assessment procedure when appropriate:1}  Medications Ordered in ED Medications  sodium chloride  flush (NS) 0.9 % injection 3 mL (has no administration in time range)    ED Course/ Medical Decision Making/ A&P   {   Click here for  ABCD2, HEART and other calculatorsREFRESH Note before signing :1}                              Medical Decision Making Amount and/or Complexity of Data Reviewed Labs: ordered. Radiology: ordered. ECG/medicine tests: ordered.   ***  {Document critical care time when appropriate:1} {Document review of labs and clinical decision tools ie heart score, Chads2Vasc2 etc:1}  {Document your independent review of radiology images, and any outside records:1} {Document your discussion with family members, caretakers, and with consultants:1} {Document social determinants of health affecting pt's care:1} {Document your decision making why or why not admission, treatments were needed:1} Final Clinical Impression(s) / ED Diagnoses Final diagnoses:  None    Rx / DC Orders ED Discharge Orders     None

## 2024-04-21 NOTE — ED Notes (Signed)
 Patient verbalized understanding of discharge instructions, patients nephew will be coming to pick the patient up from ED lobby.

## 2024-04-21 NOTE — Code Documentation (Signed)
 Stroke Response Nurse Documentation Code Documentation  Pamela Cherry is a 73 y.o. female arriving to [facility]  via [facility] on 5/9 with past medical hx of HTN, DM2, hypothyroidism. On No antithrombotic. Code stroke was activated by EMS.   Patient from home where she was LKW at 2200 and now complaining of left sided weakness.   Stroke team at the bedside on patient arrival. Labs drawn and patient cleared for CT by Dr. Wallis Gun. Patient to CT with team. NIHSS 1, see documentation for details and code stroke times. Patient with left decreased sensation on exam. The following imaging was completed:  CT Head and CTA. Patient is not a candidate for IV Thrombolytic due to too mild to treat. Patient is not a candidate for IR due to No LVO.    Bedside handoff with ED RN Fawn Hooks.    Dodson Freestone  Rapid Response RN
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
## 2024-04-21 NOTE — ED Triage Notes (Signed)
 BIB EMS from home for stroke like symptoms, LKN 2200, Patient reports left sided drift and numbness. Patient presents with left sided arm numbness. A&Ox4, GCS15, PERRLA.

## 2024-04-27 DIAGNOSIS — G959 Disease of spinal cord, unspecified: Secondary | ICD-10-CM | POA: Diagnosis not present

## 2024-04-27 DIAGNOSIS — Z6832 Body mass index (BMI) 32.0-32.9, adult: Secondary | ICD-10-CM | POA: Diagnosis not present

## 2024-05-02 ENCOUNTER — Ambulatory Visit: Payer: Medicare HMO | Admitting: Diagnostic Neuroimaging

## 2024-05-17 DIAGNOSIS — G959 Disease of spinal cord, unspecified: Secondary | ICD-10-CM | POA: Diagnosis not present

## 2024-05-22 ENCOUNTER — Ambulatory Visit: Payer: Self-pay

## 2024-05-22 NOTE — Telephone Encounter (Signed)
 FYI Only or Action Required?: Action required by provider  Patient was last seen in primary care on 10/03/2023 by Austine Lefort, MD. Called Nurse Triage reporting Left Flank Pain. Symptoms began this morning 6/025. Interventions attempted: Nothing. Symptoms are: unchanged.  Triage Disposition: See Physician Within 24 Hours  Patient/caregiver understands and will follow disposition?: No, wishes to speak with PCP                       Copied from CRM [id number]. Topic: Clinical - Red Word Triage >> May 22, 2024  8:05 AM Zipporah Him wrote: Red Word that prompted transfer to Nurse Triage: Left side kidney hurting badly, just started this morning. Thinks she has a kidney infection. Reason for Disposition  MODERATE pain (e.g., interferes with normal activities or awakens from sleep)    Patient states that it started this morning---no injuries---no other symptoms---She states that she has had a kidney infection before and she doesn't need to see a doctor---she just wants a prescription sent in.  Answer Assessment - Initial Assessment Questions 1. LOCATION: "Where does it hurt?" (e.g., left, right)     Left flank area 2. ONSET: "When did the pain start?"     This morning 05/22/2024 3. SEVERITY: "How bad is the pain?" (e.g., Scale 1-10; mild, moderate, or severe)   - MILD (1-3): doesn't interfere with normal activities    - MODERATE (4-7): interferes with normal activities or awakens from sleep    - SEVERE (8-10): excruciating pain and patient unable to do normal activities (stays in bed)       9 4. PATTERN: "Does the pain come and go, or is it constant?"      constant 5. CAUSE: "What do you think is causing the pain?"     "Kidney infection" 6. OTHER SYMPTOMS:  "Do you have any other symptoms?" (e.g., fever, abdomen pain, vomiting, leg weakness, burning with urination, blood in urine)     Patient denies  Protocols used: Flank Pain-A-AH

## 2024-05-22 NOTE — Telephone Encounter (Signed)
 Called [facility] to inform them of patient's symptoms and requests/refusal of appointment.

## 2024-05-24 ENCOUNTER — Ambulatory Visit: Admitting: Family Medicine

## 2024-06-06 ENCOUNTER — Observation Stay (HOSPITAL_COMMUNITY)
Admission: EM | Admit: 2024-06-06 | Discharge: 2024-06-07 | Disposition: A | Discharge status: Home / Self Care | Attending: Internal Medicine | Admitting: Internal Medicine

## 2024-06-06 ENCOUNTER — Other Ambulatory Visit: Payer: Self-pay

## 2024-06-06 ENCOUNTER — Emergency Department (HOSPITAL_BASED_OUTPATIENT_CLINIC_OR_DEPARTMENT_OTHER)

## 2024-06-06 ENCOUNTER — Encounter (HOSPITAL_COMMUNITY)
Admission: EM | Disposition: A | Discharge status: Home / Self Care | Payer: Self-pay | Source: Home / Self Care | Attending: Emergency Medicine

## 2024-06-06 ENCOUNTER — Emergency Department (HOSPITAL_COMMUNITY)

## 2024-06-06 ENCOUNTER — Observation Stay (HOSPITAL_COMMUNITY)

## 2024-06-06 ENCOUNTER — Encounter (HOSPITAL_COMMUNITY): Payer: Self-pay | Admitting: Emergency Medicine

## 2024-06-06 DIAGNOSIS — R0789 Other chest pain: Secondary | ICD-10-CM | POA: Diagnosis not present

## 2024-06-06 DIAGNOSIS — I214 Non-ST elevation (NSTEMI) myocardial infarction: Secondary | ICD-10-CM | POA: Diagnosis not present

## 2024-06-06 DIAGNOSIS — Z6839 Body mass index (BMI) 39.0-39.9, adult: Secondary | ICD-10-CM | POA: Insufficient documentation

## 2024-06-06 DIAGNOSIS — E785 Hyperlipidemia, unspecified: Secondary | ICD-10-CM

## 2024-06-06 DIAGNOSIS — Z79899 Other long term (current) drug therapy: Secondary | ICD-10-CM | POA: Diagnosis not present

## 2024-06-06 DIAGNOSIS — E669 Obesity, unspecified: Secondary | ICD-10-CM | POA: Diagnosis not present

## 2024-06-06 DIAGNOSIS — E1159 Type 2 diabetes mellitus with other circulatory complications: Secondary | ICD-10-CM | POA: Diagnosis present

## 2024-06-06 DIAGNOSIS — I10 Essential (primary) hypertension: Secondary | ICD-10-CM | POA: Diagnosis not present

## 2024-06-06 DIAGNOSIS — E039 Hypothyroidism, unspecified: Secondary | ICD-10-CM | POA: Diagnosis not present

## 2024-06-06 DIAGNOSIS — E876 Hypokalemia: Secondary | ICD-10-CM | POA: Diagnosis not present

## 2024-06-06 DIAGNOSIS — Z720 Tobacco use: Secondary | ICD-10-CM

## 2024-06-06 DIAGNOSIS — I959 Hypotension, unspecified: Secondary | ICD-10-CM | POA: Diagnosis not present

## 2024-06-06 DIAGNOSIS — F17210 Nicotine dependence, cigarettes, uncomplicated: Secondary | ICD-10-CM | POA: Diagnosis not present

## 2024-06-06 DIAGNOSIS — I2510 Atherosclerotic heart disease of native coronary artery without angina pectoris: Secondary | ICD-10-CM

## 2024-06-06 DIAGNOSIS — I160 Hypertensive urgency: Secondary | ICD-10-CM | POA: Insufficient documentation

## 2024-06-06 DIAGNOSIS — Z89511 Acquired absence of right leg below knee: Secondary | ICD-10-CM | POA: Diagnosis not present

## 2024-06-06 DIAGNOSIS — M5416 Radiculopathy, lumbar region: Secondary | ICD-10-CM | POA: Diagnosis not present

## 2024-06-06 DIAGNOSIS — E119 Type 2 diabetes mellitus without complications: Secondary | ICD-10-CM | POA: Insufficient documentation

## 2024-06-06 DIAGNOSIS — D649 Anemia, unspecified: Secondary | ICD-10-CM | POA: Insufficient documentation

## 2024-06-06 DIAGNOSIS — R079 Chest pain, unspecified: Principal | ICD-10-CM | POA: Insufficient documentation

## 2024-06-06 DIAGNOSIS — R918 Other nonspecific abnormal finding of lung field: Secondary | ICD-10-CM | POA: Diagnosis not present

## 2024-06-06 DIAGNOSIS — Z89441 Acquired absence of right ankle: Secondary | ICD-10-CM | POA: Insufficient documentation

## 2024-06-06 DIAGNOSIS — S98011A Complete traumatic amputation of right foot at ankle level, initial encounter: Secondary | ICD-10-CM

## 2024-06-06 DIAGNOSIS — E049 Nontoxic goiter, unspecified: Secondary | ICD-10-CM | POA: Diagnosis not present

## 2024-06-06 LAB — CBC WITH DIFFERENTIAL/PLATELET
Abs Immature Granulocytes: 0.01 10*3/uL (ref 0.00–0.07)
Basophils Absolute: 0 10*3/uL (ref 0.0–0.1)
Basophils Relative: 0 %
Eosinophils Absolute: 0.1 10*3/uL (ref 0.0–0.5)
Eosinophils Relative: 2 %
HCT: 36.4 % (ref 36.0–46.0)
Hemoglobin: 11.4 g/dL — ABNORMAL LOW (ref 12.0–15.0)
Immature Granulocytes: 0 %
Lymphocytes Relative: 17 %
Lymphs Abs: 1.3 10*3/uL (ref 0.7–4.0)
MCH: 26.6 pg (ref 26.0–34.0)
MCHC: 31.3 g/dL (ref 30.0–36.0)
MCV: 84.8 fL (ref 80.0–100.0)
Monocytes Absolute: 0.4 10*3/uL (ref 0.1–1.0)
Monocytes Relative: 5 %
Neutro Abs: 5.6 10*3/uL (ref 1.7–7.7)
Neutrophils Relative %: 76 %
Platelets: 205 10*3/uL (ref 150–400)
RBC: 4.29 MIL/uL (ref 3.87–5.11)
RDW: 16 % — ABNORMAL HIGH (ref 11.5–15.5)
WBC: 7.4 10*3/uL (ref 4.0–10.5)
nRBC: 0 % (ref 0.0–0.2)

## 2024-06-06 LAB — BASIC METABOLIC PANEL WITH GFR
Anion gap: 9 (ref 5–15)
BUN: 9 mg/dL (ref 8–23)
CO2: 22 mmol/L (ref 22–32)
Calcium: 8.5 mg/dL — ABNORMAL LOW (ref 8.9–10.3)
Chloride: 107 mmol/L (ref 98–111)
Creatinine, Ser: 0.72 mg/dL (ref 0.44–1.00)
GFR, Estimated: >60 mL/min (ref >60)
Glucose, Bld: 249 mg/dL — ABNORMAL HIGH (ref 70–99)
Potassium: 3.3 mmol/L — ABNORMAL LOW (ref 3.5–5.1)
Sodium: 138 mmol/L (ref 135–145)

## 2024-06-06 LAB — HEPATIC FUNCTION PANEL
ALT: 9 U/L (ref 0–44)
AST: 19 U/L (ref 15–41)
Albumin: 2.7 g/dL — ABNORMAL LOW (ref 3.5–5.0)
Alkaline Phosphatase: 58 U/L (ref 38–126)
Bilirubin, Direct: <0.1 mg/dL (ref 0.0–0.2)
Total Bilirubin: 0.3 mg/dL (ref 0.0–1.2)
Total Protein: 4.7 g/dL — ABNORMAL LOW (ref 6.5–8.1)

## 2024-06-06 LAB — TROPONIN I (HIGH SENSITIVITY)
Troponin I (High Sensitivity): 101 ng/L (ref <18)
Troponin I (High Sensitivity): 201 ng/L (ref <18)
Troponin I (High Sensitivity): 251 ng/L (ref <18)
Troponin I (High Sensitivity): 452 ng/L (ref <18)

## 2024-06-06 LAB — GLUCOSE, CAPILLARY
Glucose-Capillary: 113 mg/dL — ABNORMAL HIGH (ref 70–99)
Glucose-Capillary: 193 mg/dL — ABNORMAL HIGH (ref 70–99)

## 2024-06-06 LAB — T4, FREE: Free T4: 0.9 ng/dL (ref 0.61–1.12)

## 2024-06-06 LAB — ECHOCARDIOGRAM COMPLETE
Area-P 1/2: 2.07 cm2
Height: 64 in
MV VTI: 1.54 cm2
S' Lateral: 3.2 cm
Weight: 3520 [oz_av]

## 2024-06-06 LAB — MAGNESIUM: Magnesium: 1.9 mg/dL (ref 1.7–2.4)

## 2024-06-06 LAB — TSH: TSH: 1.468 u[IU]/mL (ref 0.350–4.500)

## 2024-06-06 LAB — HEMOGLOBIN A1C
Hgb A1c MFr Bld: 5.8 % — ABNORMAL HIGH (ref 4.8–5.6)
Mean Plasma Glucose: 119.76 mg/dL

## 2024-06-06 LAB — D-DIMER, QUANTITATIVE: D-Dimer, Quant: 0.55 ug{FEU}/mL — ABNORMAL HIGH (ref 0.00–0.50)

## 2024-06-06 MED ORDER — ROSUVASTATIN CALCIUM 20 MG PO TABS
40.0000 mg | ORAL_TABLET | Freq: Every day | ORAL | Status: DC
  Administered 2024-06-06 – 2024-06-07 (×2): 40 mg via ORAL
  Filled 2024-06-06 (×2): qty 2

## 2024-06-06 MED ORDER — HEPARIN SODIUM (PORCINE) 1000 UNIT/ML IJ SOLN
INTRAMUSCULAR | Status: DC | PRN
  Administered 2024-06-06: 5000 [IU] via INTRAVENOUS

## 2024-06-06 MED ORDER — IOHEXOL 350 MG/ML SOLN
75.0000 mL | Freq: Once | INTRAVENOUS | Status: AC | PRN
  Administered 2024-06-06: 75 mL via INTRAVENOUS

## 2024-06-06 MED ORDER — ISOSORBIDE MONONITRATE ER 30 MG PO TB24: 30.0000 mg | ORAL_TABLET | Freq: Every day | ORAL | Status: DC

## 2024-06-06 MED ORDER — LABETALOL HCL 5 MG/ML IV SOLN
INTRAVENOUS | Status: AC
  Administered 2024-06-06: 10 mg via INTRAVENOUS
  Filled 2024-06-06: qty 4

## 2024-06-06 MED ORDER — IRBESARTAN 300 MG PO TABS: 300.0000 mg | ORAL_TABLET | Freq: Every day | ORAL | Status: DC

## 2024-06-06 MED ORDER — HEPARIN (PORCINE) IN NACL 2-0.9 UNITS/ML
INTRAMUSCULAR | Status: DC | PRN
  Administered 2024-06-06: 10 mL via INTRA_ARTERIAL

## 2024-06-06 MED ORDER — ASPIRIN 81 MG PO TBEC: 81.0000 mg | DELAYED_RELEASE_TABLET | Freq: Every day | ORAL | Status: DC

## 2024-06-06 MED ORDER — HEPARIN (PORCINE) 25000 UT/250ML-% IV SOLN
900.0000 [IU]/h | INTRAVENOUS | Status: DC
  Administered 2024-06-06: 900 [IU]/h via INTRAVENOUS
  Filled 2024-06-06: qty 250

## 2024-06-06 MED ORDER — PERFLUTREN LIPID MICROSPHERE
1.0000 mL | INTRAVENOUS | Status: AC | PRN
  Administered 2024-06-06: 4 mL via INTRAVENOUS

## 2024-06-06 MED ORDER — SODIUM CHLORIDE 0.9 % IV SOLN
INTRAVENOUS | Status: AC
Start: 2024-06-06 — End: 2024-06-06

## 2024-06-06 MED ORDER — LIDOCAINE HCL (PF) 1 % IJ SOLN
INTRAMUSCULAR | Status: DC | PRN
  Administered 2024-06-06: 2 mL

## 2024-06-06 MED ORDER — CLOPIDOGREL BISULFATE 75 MG PO TABS: 300.0000 mg | ORAL_TABLET | Freq: Once | ORAL | Status: AC

## 2024-06-06 MED ORDER — SODIUM CHLORIDE 0.9 % WEIGHT BASED INFUSION: 3.0000 mL/kg/h | INTRAVENOUS | Status: DC

## 2024-06-06 MED ORDER — CLOPIDOGREL BISULFATE 300 MG PO TABS
ORAL_TABLET | ORAL | Status: AC
  Administered 2024-06-06: 300 mg via ORAL
  Filled 2024-06-06: qty 1

## 2024-06-06 MED ORDER — GLIPIZIDE ER 10 MG PO TB24
10.0000 mg | ORAL_TABLET | Freq: Every day | ORAL | Status: DC
  Administered 2024-06-07: 10 mg via ORAL
  Filled 2024-06-06: qty 1

## 2024-06-06 MED ORDER — LEVOTHYROXINE SODIUM 100 MCG PO TABS
200.0000 ug | ORAL_TABLET | Freq: Every day | ORAL | Status: DC
  Administered 2024-06-07: 200 ug via ORAL
  Filled 2024-06-06: qty 2

## 2024-06-06 MED ORDER — SODIUM CHLORIDE 0.9 % IV SOLN: 250.0000 mL | INTRAVENOUS | Status: DC | PRN

## 2024-06-06 MED ORDER — HEPARIN BOLUS VIA INFUSION
4000.0000 [IU] | Freq: Once | INTRAVENOUS | Status: AC
  Administered 2024-06-06: 4000 [IU] via INTRAVENOUS
  Filled 2024-06-06: qty 4000

## 2024-06-06 MED ORDER — ONDANSETRON HCL 4 MG/2ML IJ SOLN
4.0000 mg | Freq: Once | INTRAMUSCULAR | Status: AC
Start: 2024-06-06 — End: 2024-06-06
  Administered 2024-06-06: 4 mg via INTRAVENOUS
  Filled 2024-06-06: qty 2

## 2024-06-06 MED ORDER — MIDAZOLAM HCL 2 MG/2ML IJ SOLN
INTRAMUSCULAR | Status: DC | PRN
  Administered 2024-06-06: 1 mg via INTRAVENOUS

## 2024-06-06 MED ORDER — SODIUM CHLORIDE 0.9% FLUSH
3.0000 mL | Freq: Two times a day (BID) | INTRAVENOUS | Status: DC
  Administered 2024-06-06 – 2024-06-07 (×3): 3 mL via INTRAVENOUS

## 2024-06-06 MED ORDER — LABETALOL HCL 5 MG/ML IV SOLN: 10.0000 mg | Freq: Once | INTRAVENOUS | Status: AC

## 2024-06-06 MED ORDER — MORPHINE SULFATE (PF) 4 MG/ML IV SOLN
4.0000 mg | Freq: Once | INTRAVENOUS | Status: AC
  Administered 2024-06-06: 4 mg via INTRAVENOUS
  Filled 2024-06-06: qty 1

## 2024-06-06 MED ORDER — POTASSIUM CHLORIDE CRYS ER 20 MEQ PO TBCR
40.0000 meq | EXTENDED_RELEASE_TABLET | Freq: Once | ORAL | Status: AC
  Administered 2024-06-06: 40 meq via ORAL
  Filled 2024-06-06: qty 2

## 2024-06-06 MED ORDER — AMLODIPINE BESYLATE 5 MG PO TABS: 5.0000 mg | ORAL_TABLET | Freq: Every day | ORAL | Status: DC

## 2024-06-06 MED ORDER — ROSUVASTATIN CALCIUM 20 MG PO TABS: 20.0000 mg | ORAL_TABLET | Freq: Every day | ORAL | Status: DC

## 2024-06-06 MED ORDER — FENTANYL CITRATE (PF) 100 MCG/2ML IJ SOLN
INTRAMUSCULAR | Status: AC
  Filled 2024-06-06: qty 2

## 2024-06-06 MED ORDER — FENTANYL CITRATE (PF) 100 MCG/2ML IJ SOLN
INTRAMUSCULAR | Status: DC | PRN
  Administered 2024-06-06: 25 ug via INTRAVENOUS

## 2024-06-06 MED ORDER — MIDAZOLAM HCL 2 MG/2ML IJ SOLN
INTRAMUSCULAR | Status: AC
  Filled 2024-06-06: qty 2

## 2024-06-06 MED ORDER — SODIUM CHLORIDE 0.9% FLUSH
3.0000 mL | INTRAVENOUS | Status: DC | PRN
  Administered 2024-06-06: 3 mL via INTRAVENOUS

## 2024-06-06 MED ORDER — HEPARIN (PORCINE) IN NACL 1000-0.9 UT/500ML-% IV SOLN
INTRAVENOUS | Status: DC | PRN
Start: 2024-06-06 — End: 2024-06-06
  Administered 2024-06-06: 1000 mL via SURGICAL_CAVITY

## 2024-06-06 MED ORDER — ACETAMINOPHEN 325 MG PO TABS: 650.0000 mg | ORAL_TABLET | ORAL | Status: DC | PRN

## 2024-06-06 MED ORDER — SODIUM CHLORIDE 0.9% FLUSH: 3.0000 mL | INTRAVENOUS | Status: DC | PRN

## 2024-06-06 MED ORDER — SODIUM CHLORIDE 0.9% FLUSH: 3.0000 mL | Freq: Two times a day (BID) | INTRAVENOUS | Status: DC

## 2024-06-06 MED ORDER — ENOXAPARIN SODIUM 40 MG/0.4ML IJ SOSY
40.0000 mg | PREFILLED_SYRINGE | INTRAMUSCULAR | Status: DC
  Administered 2024-06-07: 40 mg via SUBCUTANEOUS
  Filled 2024-06-06: qty 0.4

## 2024-06-06 MED ORDER — CARVEDILOL 6.25 MG PO TABS
6.2500 mg | ORAL_TABLET | Freq: Two times a day (BID) | ORAL | Status: DC
Start: 2024-06-06 — End: 2024-06-07
  Administered 2024-06-06 – 2024-06-07 (×2): 6.25 mg via ORAL
  Filled 2024-06-06 (×2): qty 1

## 2024-06-06 MED ORDER — ASPIRIN 81 MG PO TBEC
81.0000 mg | DELAYED_RELEASE_TABLET | Freq: Every day | ORAL | Status: DC
  Administered 2024-06-07: 81 mg via ORAL
  Filled 2024-06-06: qty 1

## 2024-06-06 MED ORDER — INSULIN ASPART 100 UNIT/ML IJ SOLN
0.0000 [IU] | Freq: Three times a day (TID) | INTRAMUSCULAR | Status: DC
  Administered 2024-06-07: 1 [IU] via SUBCUTANEOUS

## 2024-06-06 MED ORDER — IOHEXOL 350 MG/ML SOLN
INTRAVENOUS | Status: DC | PRN
  Administered 2024-06-06: 30 mL

## 2024-06-06 MED ORDER — SODIUM CHLORIDE 0.9 % WEIGHT BASED INFUSION: 1.0000 mL/kg/h | INTRAVENOUS | Status: DC

## 2024-06-06 MED ORDER — LIDOCAINE HCL (PF) 1 % IJ SOLN
INTRAMUSCULAR | Status: AC
  Filled 2024-06-06: qty 30

## 2024-06-06 MED ORDER — HEPARIN SODIUM (PORCINE) 1000 UNIT/ML IJ SOLN
INTRAMUSCULAR | Status: AC
  Filled 2024-06-06: qty 10

## 2024-06-06 MED ORDER — NITROGLYCERIN 0.4 MG SL SUBL
0.4000 mg | SUBLINGUAL_TABLET | SUBLINGUAL | Status: DC | PRN
  Administered 2024-06-06: 0.4 mg via SUBLINGUAL
  Filled 2024-06-06: qty 1

## 2024-06-06 MED ORDER — CLOPIDOGREL BISULFATE 75 MG PO TABS
75.0000 mg | ORAL_TABLET | Freq: Every day | ORAL | Status: DC
  Administered 2024-06-07: 75 mg via ORAL
  Filled 2024-06-06: qty 1

## 2024-06-06 MED ORDER — ISOSORBIDE MONONITRATE ER 30 MG PO TB24
30.0000 mg | ORAL_TABLET | Freq: Every day | ORAL | Status: DC
  Administered 2024-06-06 – 2024-06-07 (×2): 30 mg via ORAL
  Filled 2024-06-06 (×3): qty 1

## 2024-06-06 MED ORDER — VERAPAMIL HCL 2.5 MG/ML IV SOLN
INTRAVENOUS | Status: AC
  Filled 2024-06-06: qty 2

## 2024-06-06 MED ORDER — ASPIRIN 325 MG PO TABS: 325.0000 mg | ORAL_TABLET | Freq: Every day | ORAL | Status: DC

## 2024-06-06 MED ORDER — PANTOPRAZOLE SODIUM 40 MG PO TBEC
40.0000 mg | DELAYED_RELEASE_TABLET | Freq: Every day | ORAL | Status: DC
  Administered 2024-06-06 – 2024-06-07 (×2): 40 mg via ORAL
  Filled 2024-06-06 (×2): qty 1

## 2024-06-06 MED ORDER — ONDANSETRON HCL 4 MG/2ML IJ SOLN: 4.0000 mg | Freq: Four times a day (QID) | INTRAMUSCULAR | Status: DC | PRN
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
## 2024-06-06 NOTE — Hospital Course (Signed)
 Hypertension: Obesity with a BMI of 37.76:/diabetes mellitus type 2: Tobacco abuse: Acquired hypothyroidism:

## 2024-06-06 NOTE — H&P (Signed)
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   Patient has an active order for admit to inpatient/place in observation   Perform Reverse Barbeau at any handoff, and hourly until band removed   Refer to Sidebar Report How to perform the Reverse Therisa If you are unable to obtain an A, B, or C waveform without the patient bleeding, continue with the deflation orders, and repeat the Reverse Barbeau at the next 15 minute deflation. If you ...   Pre-band removal: Place O2 sat probe with Pleth waveform on the thumb of the affected extremity. Assess and document site, pain, BP, HR, neurovascular checks, and Pleth waveform every 15 minutes until the band is removed   Band applied 1645 (time); amount of air 10 cc   If bleeding occurs during band deflation, inject as little air as possible to achieve hemostasis, do not exceed the radial band bladder maximum air amount. Perform Reverse Barbeau  to ensure patent hemostasis is maintained; wait 15 minutes before resuming band deflation   Post-band removal: Assess site, pain, BP, HR, neurovascular checks, and Pleth waveform every 30 minutes x 2, or until the patient is discharged; whichever comes first   If bleeding occurs after band is removed, apply manual pressure for 20 minutes and notify MD.   After the radial band is removed, prior to discharge apply occlusive pressure to both radial and ulnar artery of accessed hand, until you lose pleth waveform, then release pressure on radial artery only, if waveform does not return, notify the provider.   Refer to Sidebar Report How to Assess for Radial Artery Patency   Care order/instruction: Instruct patient to leave transparent dressing in place for 24 hours   When applying pressure for bleeding and/or hematoma management, monitor pulse oximetry waveform via sensor on thumb to ensure perfusion is adequate while pressure is being applied.   If a hematoma develops at puncture site, forearm, or upper arm, immediately apply manual pressure and notify the procedural provider immediately as further intervention/surgery may be necessary.   If forearm hematoma develops, apply manual BP cuff to hematoma site. Inflate the manual BP cuff to below the patient's systolic pressure and maintain for 15 minutes. After 15 minutes, deflate cuff pressure 3-47mm Hg every 2-3 minutes until deflated.   If upper arm hematoma develops, apply manual BP cuff proximal to the hematoma, Inflate to below the patient's systolic pressure and maintain for 15 minutes. After 15 minutes, deflate cuff pressure 3-8mm Hg every 2-3 minutes until deflated.   Reassess limb; may reapply and repeat manual BP cuff procedure if needed, ensure provider is notified that hematoma is not under control.   Do not apply ice or wrap arm with "Ace" or "Coban" type wrap.   Up in chair   Bathroom privileges   Elevate extremity   Initiate Oral  Care Protocol   Initiate Carrier Fluid Protocol   Begin band deflation 30 minutes after band applied by withdrawing 1 mL of air. If no bleeding is noted, at 15 minute intervals, remove 3 mL of air until all the air is removed from the compression band. Observe site for 2 minutes after each deflation to ensure patient is not going to re-bleed.   Mobility Protocol: No Restrictions (After initial bedrest and site is stable) RN to initiate protocols based on patient's level of care   Full code   Inpatient consult to Cardiology   Consult to hospitalist   Oxygen therapy Mode or (Route): Nasal  cannula   EKG 12-Lead   ED EKG   ED EKG   EKG 12-Lead   EKG 12-lead   EKG 12-Lead   ECHOCARDIOGRAM COMPLETE   Place in observation (patient's expected length of stay will be less than 2 midnights)    Author: Mario LULLA Blanch, MD 12 pm- 8 pm. [facility]. 06/06/2024 7:50 PM Please note for any communication after hours contact [facility] Assigned provider on call on Amion.
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
## 2024-06-06 NOTE — Progress Notes (Signed)
 Echocardiogram 2D Echocardiogram has been performed.  Damien FALCON Zalaya Astarita RDCS 06/06/2024, 3:30 PM

## 2024-06-06 NOTE — ED Triage Notes (Signed)
 Pt BIB [facility] from home due to chest pain that radiates to left arm that started at 0810 this morning.  Pt was given 324 aspirin  and 0.4nitro by [facility] with no relief.  18g left forearm. VS BP 116/58, HR 68

## 2024-06-06 NOTE — Interval H&P Note (Signed)
 History and Physical Interval Note:  06/06/2024 4:09 PM  Pamela Cherry  has presented today for surgery, with the diagnosis of nstemi.  The various methods of treatment have been discussed with the patient and family. After consideration of risks, benefits and other options for treatment, the patient has consented to  Procedure(s): LEFT HEART CATH AND CORONARY ANGIOGRAPHY (N/A) as a surgical intervention.  The patient's history has been reviewed, patient examined, no change in status, stable for surgery.  I have reviewed the patient's chart and labs.  Questions were answered to the patient's satisfaction.     Anye Brose

## 2024-06-06 NOTE — ED Provider Notes (Signed)
 [place] EMERGENCY DEPARTMENT AT [facility] Provider Note   CSN: [id number] Arrival date & time: 06/06/24  9092     Patient presents with: Chest Pain   Pamela Cherry is a 73 y.o. female patient with history of hypertension, diabetes, and 51-pack-year smoking history who presents to the emergency department today for further evaluation of chest pain that started this morning while she was in the recliner.  She states that feels like she is getting hit with a sledgehammer.  It is going through to the back.  She denies any focal weakness or numbness to her arms or legs.  She denies any shortness of breath, fever, chills, cough, diaphoresis, nausea, vomiting, diarrhea.    Chest Pain      Prior to Admission medications   Medication Sig Start Date End Date Taking? Authorizing Provider  amLODipine  (NORVASC ) 5 MG tablet Take 1 tablet (5 mg total) by mouth daily. 10/03/23  Yes Duanne Butler DASEN, MD  aspirin  325 MG tablet Take 325 mg by mouth daily.   Yes [provider]  calcium  carbonate (OS-CAL) 600 MG TABS tablet Take 600 mg by mouth daily.    Yes [provider]  cyanocobalamin  1000 MCG tablet Take 1 tablet (1,000 mcg total) by mouth daily. 10/20/22  Yes Regalado, Belkys A, MD  FEROSUL 325 (65 Fe) MG tablet TAKE 1 TABLET EVERY DAY WITH BREAKFAST Patient taking differently: Take 325 mg by mouth daily with breakfast. 10/11/22  Yes Duanne Butler DASEN, MD  gabapentin  (NEURONTIN ) 300 MG capsule Take 1 capsule (300 mg total) by mouth 3 (three) times daily. 10/24/23  Yes Duanne Butler DASEN, MD  glipiZIDE  (GLUCOTROL  XL) 10 MG 24 hr tablet Take 1 tablet (10 mg total) by mouth daily with breakfast. 08/28/21  Yes Duanne Butler DASEN, MD  levothyroxine  (SYNTHROID ) 200 MCG tablet Take 1 tablet (200 mcg total) by mouth daily. 10/04/23  Yes Duanne Butler DASEN, MD  metFORMIN  (GLUCOPHAGE ) 1000 MG tablet Take 1 tablet (1,000 mg total) by mouth 2 (two) times daily with a meal. 09/03/21   Yes Duanne Butler DASEN, MD  Multiple Vitamin (MULTI-VITAMINS) TABS Take 1 tablet by mouth daily.    Yes [provider]  olmesartan  (BENICAR ) 40 MG tablet TAKE 1 TABLET EVERY DAY 08/19/22  Yes Duanne Butler DASEN, MD  pantoprazole  (PROTONIX ) 40 MG tablet Take 1 tablet (40 mg total) by mouth daily. 10/19/22 04/21/25 Yes Regalado, Belkys A, MD  pioglitazone  (ACTOS ) 30 MG tablet Take 1 tablet (30 mg total) by mouth daily. 08/28/21  Yes Duanne Butler DASEN, MD  rosuvastatin  (CRESTOR ) 20 MG tablet Take 1 tablet (20 mg total) by mouth daily. 12/02/22  Yes Duanne Butler DASEN, MD  Alcohol Swabs (DROPSAFE ALCOHOL PREP) 70 % PADS USE TO CHECK BLOOD SUGAR 4 TIMES PER DAY 02/13/24   Duanne Butler DASEN, MD  Blood Glucose Monitoring Suppl DEVI 1 each by Does not apply route in the morning, at noon, and at bedtime. May substitute to any manufacturer covered by patient's insurance. 01/07/23   Duanne Butler DASEN, MD    Allergies: Other    Review of Systems  Cardiovascular:  Positive for chest pain.  All other systems reviewed and are negative.   Updated Vital Signs BP (!) 150/71   Pulse 65   Temp 97.7 F (36.5 C) (Oral)   Resp 11   Ht 5' 4 (1.626 m)   Wt 99.8 kg   SpO2 97%   BMI 37.76 kg/m   Physical  Exam Vitals and nursing note reviewed.  Constitutional:      General: She is not in acute distress.    Appearance: Normal appearance.  HENT:     Head: Normocephalic and atraumatic.   Eyes:     General:        Right eye: No discharge.        Left eye: No discharge.    Cardiovascular:     Comments: Regular rate and rhythm.  S1/S2 are distinct without any evidence of murmur, rubs, or gallops.  Radial pulses are 2+ bilaterally.  Dorsalis pedis pulses are 2+ bilaterally.  No evidence of pedal edema. Pulmonary:     Comments: Clear to auscultation bilaterally.  Normal effort.  No respiratory distress.  No evidence of wheezes, rales, or rhonchi heard throughout. Chest:     Comments: There is  reproducible chest pain with palpation of the chest wall. Abdominal:     General: Abdomen is flat. Bowel sounds are normal. There is no distension.     Tenderness: There is no abdominal tenderness. There is no guarding or rebound.   Musculoskeletal:        General: Normal range of motion.     Cervical back: Neck supple.   Skin:    General: Skin is warm and dry.     Findings: No rash.   Neurological:     General: No focal deficit present.     Mental Status: She is alert.   Psychiatric:        Mood and Affect: Mood normal.        Behavior: Behavior normal.     (all labs ordered are listed, but only abnormal results are displayed) Labs Reviewed  CBC WITH DIFFERENTIAL/PLATELET - Abnormal; Notable for the following components:      Result Value   Hemoglobin 11.4 (*)    RDW 16.0 (*)    All other components within normal limits  BASIC METABOLIC PANEL WITH GFR - Abnormal; Notable for the following components:   Potassium 3.3 (*)    Glucose, Bld 249 (*)    Calcium  8.5 (*)    All other components within normal limits  D-DIMER, QUANTITATIVE - Abnormal; Notable for the following components:   D-Dimer, Quant 0.55 (*)    All other components within normal limits  TROPONIN I (HIGH SENSITIVITY) - Abnormal; Notable for the following components:   Troponin I (High Sensitivity) 101 (*)    All other components within normal limits  TROPONIN I (HIGH SENSITIVITY) - Abnormal; Notable for the following components:   Troponin I (High Sensitivity) 201 (*)    All other components within normal limits  HEPARIN  LEVEL (UNFRACTIONATED)  HEMOGLOBIN A1C  T4, FREE  TSH  MAGNESIUM   TROPONIN I (HIGH SENSITIVITY)    EKG: None  Radiology: DG Chest 2 View Result Date: 06/06/2024 CLINICAL DATA:  Chest pain, pressure, hypertension EXAM: CHEST - 2 VIEW COMPARISON:  10/17/2022 FINDINGS: Lungs are clear.  No pneumothorax. Heart size and mediastinal contours are within normal limits. Aortic  Atherosclerosis (ICD10-170.0). No effusion. Cervical fixation hardware partially visualized. IMPRESSION: No acute cardiopulmonary disease. Electronically Signed   By: JONETTA Faes M.D.   On: 06/06/2024 10:53     .Critical Care  Performed by: Pamela Cameron HERO, PA-C Authorized by: Pamela Cameron HERO, PA-C   Critical care provider statement:    Critical care time (minutes):  35   Critical care was necessary to treat or prevent imminent or life-threatening deterioration of the following conditions:  Cardiac failure   Critical care was time spent personally by me on the following activities:  Blood draw for specimens, development of treatment plan with patient or surrogate, discussions with consultants, ordering and performing treatments and interventions, ordering and review of laboratory studies, ordering and review of radiographic studies, pulse oximetry and re-evaluation of patient's condition    Medications Ordered in the ED  heparin  ADULT infusion 100 units/mL (25000 units/250mL) (900 Units/hr Intravenous New Bag/Given 06/06/24 1136)  carvedilol (COREG) tablet 6.25 mg (has no administration in time range)  rosuvastatin  (CRESTOR ) tablet 40 mg (has no administration in time range)  aspirin  tablet 325 mg (has no administration in time range)  amLODipine  (NORVASC ) tablet 5 mg (has no administration in time range)  irbesartan  (AVAPRO ) tablet 300 mg (has no administration in time range)  rosuvastatin  (CRESTOR ) tablet 20 mg (has no administration in time range)  glipiZIDE  (GLUCOTROL  XL) 24 hr tablet 10 mg (has no administration in time range)  sodium chloride  flush (NS) 0.9 % injection 3 mL (has no administration in time range)  sodium chloride  flush (NS) 0.9 % injection 3 mL (has no administration in time range)  0.9 %  sodium chloride  infusion (has no administration in time range)  aspirin  EC tablet 81 mg (has no administration in time range)  nitroGLYCERIN (NITROSTAT) SL tablet 0.4 mg (has no  administration in time range)  acetaminophen  (TYLENOL ) tablet 650 mg (has no administration in time range)  ondansetron  (ZOFRAN ) injection 4 mg (has no administration in time range)  insulin  aspart (novoLOG ) injection 0-9 Units (has no administration in time range)  morphine  (PF) 4 MG/ML injection 4 mg (4 mg Intravenous Given 06/06/24 0936)  ondansetron  (ZOFRAN ) injection 4 mg (4 mg Intravenous Given 06/06/24 0936)  heparin  bolus via infusion 4,000 Units (4,000 Units Intravenous Bolus from Bag 06/06/24 1136)  potassium chloride  SA (KLOR-CON  M) CR tablet 40 mEq (40 mEq Oral Given 06/06/24 1423)    Clinical Course as of 06/06/24 1504  Wed Jun 06, 2024  1304 I spoke with Carley with cardiology who agrees to consult. [CF]  1337 I spoke with Dr. Tobie with [facility] who agrees to admit the patient. [CF]  1409 D-dimer, quantitative(!) This is elevated but still within normal limits for age-adjusted D-dimer. [CF]  1410 Troponin I (High Sensitivity)(!!) Initial troponin was elevated and delta troponin is uptrending. [CF]  1410 CBC with Differential(!) Normal.  [CF]  1410 Basic metabolic panel(!) Mild hypokalemia. No other abnormalities.  [CF]  1504 DG Chest 2 View I personally ordered interpret this study denies any evidence of pneumonia, pneumothorax, cardiomegaly.  I do agree with the radiologist interpretation. [CF]    Clinical Course User Index [CF] Pamela Cameron HERO, PA-C    Medical Decision Making Pamela Cherry is a 73 y.o. female patient who presents to the emergency room today for further evaluation of chest pain. Her presentation is concerning for ACS, EKG was non STEMI, however troponin was elevated concerning for NSTEMI, and the patient was started on heparin , pain was controlled with Morphine , cardiology was consulted and patient was admitted to [facility] service. Patient with no signs of heart failure. Given history and story considered but low risk for aortic  dissection, pneumonia, or PE.  Patient currently stable for admission.    Amount and/or Complexity of Data Reviewed Labs: ordered. Decision-making details documented in ED Course. Radiology: ordered.  Risk Prescription drug management. Decision regarding hospitalization.     Final diagnoses:  NSTEMI (non-ST elevated  myocardial infarction) [facility])    ED Discharge Orders     None          Pamela Cherry, [place] 06/06/24 1505    Pamela Pac T, DO 06/07/24 [phone]

## 2024-06-07 ENCOUNTER — Other Ambulatory Visit (HOSPITAL_COMMUNITY): Payer: Self-pay

## 2024-06-07 ENCOUNTER — Telehealth (HOSPITAL_COMMUNITY): Payer: Self-pay | Admitting: Pharmacy Technician

## 2024-06-07 ENCOUNTER — Encounter (HOSPITAL_COMMUNITY): Payer: Self-pay | Admitting: Cardiovascular Disease

## 2024-06-07 DIAGNOSIS — R0789 Other chest pain: Secondary | ICD-10-CM | POA: Diagnosis not present

## 2024-06-07 DIAGNOSIS — I214 Non-ST elevation (NSTEMI) myocardial infarction: Secondary | ICD-10-CM | POA: Diagnosis not present

## 2024-06-07 DIAGNOSIS — Z89511 Acquired absence of right leg below knee: Secondary | ICD-10-CM

## 2024-06-07 DIAGNOSIS — E669 Obesity, unspecified: Secondary | ICD-10-CM | POA: Diagnosis present

## 2024-06-07 DIAGNOSIS — E876 Hypokalemia: Secondary | ICD-10-CM | POA: Diagnosis not present

## 2024-06-07 DIAGNOSIS — E785 Hyperlipidemia, unspecified: Secondary | ICD-10-CM | POA: Diagnosis not present

## 2024-06-07 DIAGNOSIS — I10 Essential (primary) hypertension: Secondary | ICD-10-CM | POA: Diagnosis not present

## 2024-06-07 LAB — BASIC METABOLIC PANEL WITH GFR
Anion gap: 6 (ref 5–15)
BUN: 10 mg/dL (ref 8–23)
CO2: 26 mmol/L (ref 22–32)
Calcium: 8.7 mg/dL — ABNORMAL LOW (ref 8.9–10.3)
Chloride: 108 mmol/L (ref 98–111)
Creatinine, Ser: 0.62 mg/dL (ref 0.44–1.00)
GFR, Estimated: >60 mL/min (ref >60)
Glucose, Bld: 119 mg/dL — ABNORMAL HIGH (ref 70–99)
Potassium: 3.8 mmol/L (ref 3.5–5.1)
Sodium: 140 mmol/L (ref 135–145)

## 2024-06-07 LAB — LIPID PANEL
Cholesterol: 102 mg/dL (ref 0–200)
HDL: 25 mg/dL — ABNORMAL LOW (ref >40)
LDL Cholesterol: 55 mg/dL (ref 0–99)
Total CHOL/HDL Ratio: 4.1 ratio
Triglycerides: 110 mg/dL (ref <150)
VLDL: 22 mg/dL (ref 0–40)

## 2024-06-07 LAB — CBC
HCT: 33.4 % — ABNORMAL LOW (ref 36.0–46.0)
Hemoglobin: 10.1 g/dL — ABNORMAL LOW (ref 12.0–15.0)
MCH: 25.6 pg — ABNORMAL LOW (ref 26.0–34.0)
MCHC: 30.2 g/dL (ref 30.0–36.0)
MCV: 84.8 fL (ref 80.0–100.0)
Platelets: 186 10*3/uL (ref 150–400)
RBC: 3.94 MIL/uL (ref 3.87–5.11)
RDW: 16 % — ABNORMAL HIGH (ref 11.5–15.5)
WBC: 5.9 10*3/uL (ref 4.0–10.5)
nRBC: 0 % (ref 0.0–0.2)

## 2024-06-07 LAB — GLUCOSE, CAPILLARY
Glucose-Capillary: 107 mg/dL — ABNORMAL HIGH (ref 70–99)
Glucose-Capillary: 146 mg/dL — ABNORMAL HIGH (ref 70–99)

## 2024-06-07 LAB — MAGNESIUM: Magnesium: 1.6 mg/dL — ABNORMAL LOW (ref 1.7–2.4)

## 2024-06-07 MED ORDER — MAGNESIUM OXIDE -MG SUPPLEMENT 400 (240 MG) MG PO TABS
800.0000 mg | ORAL_TABLET | Freq: Every day | ORAL | Status: DC
  Administered 2024-06-07: 800 mg via ORAL
  Filled 2024-06-07: qty 2

## 2024-06-07 MED ORDER — CARVEDILOL 6.25 MG PO TABS
6.2500 mg | ORAL_TABLET | Freq: Two times a day (BID) | ORAL | 0 refills | Status: AC
Start: 2024-06-07 — End: ?

## 2024-06-07 MED ORDER — NITROGLYCERIN 0.4 MG SL SUBL
0.4000 mg | SUBLINGUAL_TABLET | SUBLINGUAL | 0 refills | Status: AC | PRN
Start: 2024-06-07 — End: ?

## 2024-06-07 MED ORDER — MAGNESIUM OXIDE -MG SUPPLEMENT 400 (240 MG) MG PO TABS: 400.0000 mg | ORAL_TABLET | Freq: Every day | ORAL | 0 refills | Status: AC

## 2024-06-07 MED ORDER — ISOSORBIDE MONONITRATE ER 30 MG PO TB24: 30.0000 mg | ORAL_TABLET | Freq: Every day | ORAL | 0 refills | Status: AC

## 2024-06-07 MED ORDER — CLOPIDOGREL BISULFATE 75 MG PO TABS: 75.0000 mg | ORAL_TABLET | Freq: Every day | ORAL | 1 refills | Status: AC

## 2024-06-07 MED ORDER — ASPIRIN 81 MG PO TBEC
81.0000 mg | DELAYED_RELEASE_TABLET | Freq: Every day | ORAL | 0 refills | Status: AC
Start: 2024-06-08 — End: ?

## 2024-06-07 MED ORDER — METFORMIN HCL 1000 MG PO TABS: 1000.0000 mg | ORAL_TABLET | Freq: Two times a day (BID) | ORAL | Status: AC

## 2024-06-07 MED ORDER — ROSUVASTATIN CALCIUM 40 MG PO TABS: 40.0000 mg | ORAL_TABLET | Freq: Every day | ORAL | 0 refills | Status: DC

## 2024-06-07 NOTE — Telephone Encounter (Signed)
 Patient Product/process development scientist completed.    The patient is insured through [employer]. Patient has Medicare and is not eligible for a copay card, but may be able to apply for patient assistance or Medicare RX Payment Plan (Patient Must reach out to their plan, if eligible for payment plan), if available.    Ran test claim for Ozempic 2 mg/3ml and the current 30 day co-pay is $497.00 due to a $450.00 deductible.  Will be $47.00 once deductible is met.   This test claim was processed through [facility]- copay amounts may vary at other pharmacies due to pharmacy/plan contracts, or as the patient moves through the different stages of their insurance plan.     Reyes Sharps, CPHT Pharmacy Technician III Certified Patient Advocate [facility] Patient Advocate Team Direct Number: [phone]  Fax: [phone]

## 2024-06-07 NOTE — Progress Notes (Signed)
 Pt discharged home with younger brother. Pt alert and oriented at discharge. AVS discussed and patient educated on medications and making follow up appointments. Telemetry monitor and IV discontinued prior to discharge. Education and care plan resolved.

## 2024-06-07 NOTE — Progress Notes (Addendum)
  Progress Note  Patient Name: Pamela Cherry Date of Encounter: 06/07/2024 [facility] Cardiologist: Jerel Balding, MD   Interval Summary   No angina overnight. Mild coronary plaque at cath, no clear culprit lesion/vessel. Echo with normal wall motion and LVEF. No problems at radial cath site.  Vital Signs Vitals:   06/06/24 1922 06/06/24 2316 06/07/24 0324 06/07/24 0820  BP: (!) 142/60 (!) 106/50 (!) 110/50 138/71  Pulse: 64 72 70 68  Resp: 13 12 18 16   Temp: 98.1 F (36.7 C) 98.7 F (37.1 C) 98 F (36.7 C) 98.2 F (36.8 C)  TempSrc: Oral Oral Oral Oral  SpO2: 96% 94% 96% 98%  Weight:      Height:        Intake/Output Summary (Last 24 hours) at 06/07/2024 0847 Last data filed at 06/07/2024 0825 Gross per 24 hour  Intake 364.92 ml  Output 500 ml  Net -135.08 ml      06/06/2024    9:15 AM 04/20/2024   11:00 PM 01/21/2024   12:35 PM  Last 3 Weights  Weight (lbs) 220 lb 200 lb 2.8 oz 203 lb 11.3 oz  Weight (kg) 99.791 kg 90.8 kg 92.4 kg      Telemetry/ECG  NSR - Personally Reviewed  Physical Exam  GEN: No acute distress.   Neck: No JVD Cardiac: RRR, no murmurs, rubs, or gallops.  Respiratory: Clear to auscultation bilaterally. GI: Soft, nontender, non-distended  MS: No edema  Assessment & Plan   Small NSTEMI without LV function consequences or arrhythmia, now asymptomatic. Unable to identify a culprit lesion, but there is diffuse coronary atherosclerosis and calcification, particularly in the proximal LAD and RCA. Treat with dual antiplatelet therapy and aggressive risk factor modification. Has a very low HDL cholesterol, but LDL is great. Reduce intake of simple carbs and starches with high glycemic index, more veggies and complex whole grain carbs, unsaturated fat from fish/oils/nuts, etc. Having said that A1c is exceptional. Would probably have better long term outcome with GLP-1 agonist rather than Actos . Will try to make that transition and have  asked Pharmacy to explore the cost: still has to meet a $497 deductible, then copay $47 per month. She is reluctant as she and he husband survive on SSI benefits alone.. 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  [facility] will sign off.   The patient is ready for discharge today from a cardiac standpoint. Medication Recommendations:   Clopidogrel 75 mg daily ASA 81 mg daily Rosuvastatin  40 mg daily (new dose) Other recommendations (labs, testing, etc): explore GLP-1 cost coverage options as OP   Follow up as an outpatient:  f/u 3-4 weeks For questions or updates, please contact [facility] Please consult [email or web address] for contact info under       Signed, Jerel Balding, MD

## 2024-06-07 NOTE — Plan of Care (Signed)
 Problem: Education: Goal: Ability to describe self-care measures that may prevent or decrease complications (Diabetes Survival Skills Education) will improve 06/07/2024 1233 by Cleotilde Bernice FALCON, RN Outcome: Adequate for Discharge 06/07/2024 1233 by Cleotilde Bernice FALCON, RN Outcome: Progressing Goal: Individualized Educational Video(s) 06/07/2024 1233 by Cleotilde Bernice FALCON, RN Outcome: Adequate for Discharge 06/07/2024 1233 by Cleotilde Bernice FALCON, RN Outcome: Progressing   Problem: Coping: Goal: Ability to adjust to condition or change in health will improve 06/07/2024 1233 by Cleotilde Bernice FALCON, RN Outcome: Adequate for Discharge 06/07/2024 1233 by Cleotilde Bernice FALCON, RN Outcome: Progressing   Problem: Fluid Volume: Goal: Ability to maintain a balanced intake and output will improve 06/07/2024 1233 by Cleotilde Bernice FALCON, RN Outcome: Adequate for Discharge 06/07/2024 1233 by Cleotilde Bernice FALCON, RN Outcome: Progressing   Problem: Health Behavior/Discharge Planning: Goal: Ability to identify and utilize available resources and services will improve 06/07/2024 1233 by Cleotilde Bernice FALCON, RN Outcome: Adequate for Discharge 06/07/2024 1233 by Cleotilde Bernice FALCON, RN Outcome: Progressing Goal: Ability to manage health-related needs will improve 06/07/2024 1233 by Cleotilde Bernice FALCON, RN Outcome: Adequate for Discharge 06/07/2024 1233 by Cleotilde Bernice FALCON, RN Outcome: Progressing   Problem: Metabolic: Goal: Ability to maintain appropriate glucose levels will improve 06/07/2024 1233 by Cleotilde Bernice FALCON, RN Outcome: Adequate for Discharge 06/07/2024 1233 by Cleotilde Bernice FALCON, RN Outcome: Progressing   Problem: Nutritional: Goal: Maintenance of adequate nutrition will improve 06/07/2024 1233 by Cleotilde Bernice FALCON, RN Outcome: Adequate for Discharge 06/07/2024 1233 by Cleotilde Bernice FALCON, RN Outcome: Progressing Goal: Progress toward achieving an optimal weight will improve 06/07/2024 1233 by Cleotilde Bernice FALCON, RN Outcome: Adequate for  Discharge 06/07/2024 1233 by Cleotilde Bernice FALCON, RN Outcome: Progressing   Problem: Skin Integrity: Goal: Risk for impaired skin integrity will decrease 06/07/2024 1233 by Cleotilde Bernice FALCON, RN Outcome: Adequate for Discharge 06/07/2024 1233 by Cleotilde Bernice FALCON, RN Outcome: Progressing   Problem: Tissue Perfusion: Goal: Adequacy of tissue perfusion will improve 06/07/2024 1233 by Cleotilde Bernice FALCON, RN Outcome: Adequate for Discharge 06/07/2024 1233 by Cleotilde Bernice FALCON, RN Outcome: Progressing   Problem: Education: Goal: Understanding of cardiac disease, CV risk reduction, and recovery process will improve 06/07/2024 1233 by Cleotilde Bernice FALCON, RN Outcome: Adequate for Discharge 06/07/2024 1233 by Cleotilde Bernice FALCON, RN Outcome: Progressing Goal: Individualized Educational Video(s) 06/07/2024 1233 by Cleotilde Bernice FALCON, RN Outcome: Adequate for Discharge 06/07/2024 1233 by Cleotilde Bernice FALCON, RN Outcome: Progressing   Problem: Activity: Goal: Ability to tolerate increased activity will improve 06/07/2024 1233 by Cleotilde Bernice FALCON, RN Outcome: Adequate for Discharge 06/07/2024 1233 by Cleotilde Bernice FALCON, RN Outcome: Progressing   Problem: Cardiac: Goal: Ability to achieve and maintain adequate cardiovascular perfusion will improve 06/07/2024 1233 by Cleotilde Bernice FALCON, RN Outcome: Adequate for Discharge 06/07/2024 1233 by Cleotilde Bernice FALCON, RN Outcome: Progressing   Problem: Health Behavior/Discharge Planning: Goal: Ability to safely manage health-related needs after discharge will improve 06/07/2024 1233 by Cleotilde Bernice FALCON, RN Outcome: Adequate for Discharge 06/07/2024 1233 by Cleotilde Bernice FALCON, RN Outcome: Progressing   Problem: Education: Goal: Knowledge of General Education information will improve Description: Including pain rating scale, medication(s)/side effects and non-pharmacologic comfort measures 06/07/2024 1233 by Cleotilde Bernice FALCON, RN Outcome: Adequate for Discharge 06/07/2024 1233 by Cleotilde Bernice FALCON, RN Outcome: Progressing   Problem: Health Behavior/Discharge Planning: Goal: Ability to manage health-related needs will improve 06/07/2024 1233 by Cleotilde Bernice FALCON, RN Outcome: Adequate for Discharge 06/07/2024 1233 by Cleotilde Bernice  F, RN Outcome: Progressing   Problem: Clinical Measurements: Goal: Ability to maintain clinical measurements within normal limits will improve 06/07/2024 1233 by Cleotilde Bernice FALCON, RN Outcome: Adequate for Discharge 06/07/2024 1233 by Cleotilde Bernice FALCON, RN Outcome: Progressing Goal: Will remain free from infection 06/07/2024 1233 by Cleotilde Bernice FALCON, RN Outcome: Adequate for Discharge 06/07/2024 1233 by Cleotilde Bernice FALCON, RN Outcome: Progressing Goal: Diagnostic test results will improve 06/07/2024 1233 by Cleotilde Bernice FALCON, RN Outcome: Adequate for Discharge 06/07/2024 1233 by Cleotilde Bernice FALCON, RN Outcome: Progressing Goal: Respiratory complications will improve 06/07/2024 1233 by Cleotilde Bernice FALCON, RN Outcome: Adequate for Discharge 06/07/2024 1233 by Cleotilde Bernice FALCON, RN Outcome: Progressing Goal: Cardiovascular complication will be avoided 06/07/2024 1233 by Cleotilde Bernice FALCON, RN Outcome: Adequate for Discharge 06/07/2024 1233 by Cleotilde Bernice FALCON, RN Outcome: Progressing   Problem: Activity: Goal: Risk for activity intolerance will decrease 06/07/2024 1233 by Cleotilde Bernice FALCON, RN Outcome: Adequate for Discharge 06/07/2024 1233 by Cleotilde Bernice FALCON, RN Outcome: Progressing   Problem: Nutrition: Goal: Adequate nutrition will be maintained 06/07/2024 1233 by Cleotilde Bernice FALCON, RN Outcome: Adequate for Discharge 06/07/2024 1233 by Cleotilde Bernice FALCON, RN Outcome: Progressing   Problem: Coping: Goal: Level of anxiety will decrease 06/07/2024 1233 by Cleotilde Bernice FALCON, RN Outcome: Adequate for Discharge 06/07/2024 1233 by Cleotilde Bernice FALCON, RN Outcome: Progressing   Problem: Elimination: Goal: Will not experience complications related to bowel motility 06/07/2024 1233 by  Cleotilde Bernice FALCON, RN Outcome: Adequate for Discharge 06/07/2024 1233 by Cleotilde Bernice FALCON, RN Outcome: Progressing Goal: Will not experience complications related to urinary retention 06/07/2024 1233 by Cleotilde Bernice FALCON, RN Outcome: Adequate for Discharge 06/07/2024 1233 by Cleotilde Bernice FALCON, RN Outcome: Progressing   Problem: Pain Managment: Goal: General experience of comfort will improve and/or be controlled 06/07/2024 1233 by Cleotilde Bernice FALCON, RN Outcome: Adequate for Discharge 06/07/2024 1233 by Cleotilde Bernice FALCON, RN Outcome: Progressing   Problem: Safety: Goal: Ability to remain free from injury will improve 06/07/2024 1233 by Cleotilde Bernice FALCON, RN Outcome: Adequate for Discharge 06/07/2024 1233 by Cleotilde Bernice FALCON, RN Outcome: Progressing   Problem: Skin Integrity: Goal: Risk for impaired skin integrity will decrease 06/07/2024 1233 by Cleotilde Bernice FALCON, RN Outcome: Adequate for Discharge 06/07/2024 1233 by Cleotilde Bernice FALCON, RN Outcome: Progressing   Problem: Education: Goal: Understanding of CV disease, CV risk reduction, and recovery process will improve 06/07/2024 1233 by Cleotilde Bernice FALCON, RN Outcome: Adequate for Discharge 06/07/2024 1233 by Cleotilde Bernice FALCON, RN Outcome: Progressing Goal: Individualized Educational Video(s) 06/07/2024 1233 by Cleotilde Bernice FALCON, RN Outcome: Adequate for Discharge 06/07/2024 1233 by Cleotilde Bernice FALCON, RN Outcome: Progressing   Problem: Activity: Goal: Ability to return to baseline activity level will improve 06/07/2024 1233 by Cleotilde Bernice FALCON, RN Outcome: Adequate for Discharge 06/07/2024 1233 by Cleotilde Bernice FALCON, RN Outcome: Progressing   Problem: Cardiovascular: Goal: Ability to achieve and maintain adequate cardiovascular perfusion will improve 06/07/2024 1233 by Cleotilde Bernice FALCON, RN Outcome: Adequate for Discharge 06/07/2024 1233 by Cleotilde Bernice FALCON, RN Outcome: Progressing Goal: Vascular access site(s) Level 0-1 will be maintained 06/07/2024 1233 by  Cleotilde Bernice FALCON, RN Outcome: Adequate for Discharge 06/07/2024 1233 by Cleotilde Bernice FALCON, RN Outcome: Progressing   Problem: Health Behavior/Discharge Planning: Goal: Ability to safely manage health-related needs after discharge will improve 06/07/2024 1233 by Cleotilde Bernice FALCON, RN Outcome: Adequate for Discharge 06/07/2024 1233 by Cleotilde Bernice FALCON, RN Outcome: Progressing

## 2024-06-07 NOTE — Discharge Summary (Signed)
 Physician Discharge Summary   Patient: Pamela Cherry MRN: [id number] DOB: [birth year]  Admit date:     06/06/2024  Discharge date: 06/07/2024  Discharge Physician: Yetta Blanch  PCP: [facility]  Recommendations at discharge: Follow-up with PCP in 1 week with a CBC and BMP. Also discussed GLP-1 agonist medication with PCP. Lipid and LFT panel in 1 month. Follow-up with cardiology as recommended.   Follow-up Information     [facility] Schedule an appointment as soon as possible for a visit in 1 week(s).   Why: with BMP lab to look at kidney and electrolytes, with CBC lab to look at blood counts, discuss GLP 1 agonist medicines. repeat lipid panel in 30month. Contact information: [address] [place] [address] [phone]         Janene Boer, [place]. Go to.   Specialties: Cardiology, Radiology Why: the Appointment As Scheduled Contact information: [address] 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  Chest pain. Chest pain was 8 out of 10 felt like something was sitting on her chest.  Resolved with nitrate after 30 minutes.  Troponins were elevated 101-201. EKG had some T wave inversions but no acute ischemic signs. Cardiology was consulted. Underwent cardiac catheterization. Has nonobstructive coronary artery disease and did not require any intervention. Echocardiogram showed preserved EF.  No wall  motion abnormality. Patient was started on beta-blocker, Imdur as well as Plavix. Patient will follow-up with cardiology outpatient. CT chest PE protocol was also negative for pulmonary embolism. Recommend to hold metformin  for 48 hours.  Hypertensive urgency. Blood pressure was significantly elevated with systolic in 200 as well. On amlodipine  and olmesartan  at home. Started on Imdur and Coreg. Will hold olmesartan  for now. Follow-up with PCP for further discussion of his blood pressure medication.  HLD. LDL was 80. Recommend repeating LDL in 1 month. Crestor  dose was increased from 40 to 40 mg.  Hypokalemia. Replaced.  Type of diabetes mellitus, well-controlled without long-term insulin  use. For now continuing home regimen. Recommend outpatient follow-up with PCP for further discussion of modification of diabetic medications including initiation of GLP-1 agonist. Hold metformin  for 48 hours.  Hypothyroidism. Continue Synthroid .  Anemia. H&H relatively stable.  Morbid obesity. Placing the patient at a high risk for poor outcome. Body mass index is 37.76 kg/m.   Consultants:  Cardiology  Procedures performed:  Echocardiogram Cardiac catheterization  DISCHARGE MEDICATION: Allergies as of 06/07/2024       Reactions   Other Swelling   NICKLE ALLERGY        Medication List     PAUSE taking these medications    olmesartan  40 MG tablet Wait to take this until your doctor or other care provider tells you to start again. Until seen by PCP Commonly known as: BENICAR  TAKE 1 TABLET EVERY DAY       STOP taking these medications    amLODipine   5 MG tablet Commonly known as: NORVASC    aspirin  325 MG tablet Replaced by: aspirin  EC 81 MG tablet   DropSafe Alcohol Prep 70 % Pads       TAKE these medications    aspirin  EC 81 MG tablet Take 1 tablet (81 mg total) by mouth daily. Swallow whole. Start taking on: June 08, 2024 Replaces: aspirin  325 MG  tablet   Blood Glucose Monitoring Suppl Devi 1 each by Does not apply route in the morning, at noon, and at bedtime. May substitute to any manufacturer covered by patient's insurance.   calcium  carbonate 600 MG Tabs tablet Commonly known as: OS-CAL Take 600 mg by mouth daily.   carvedilol 6.25 MG tablet Commonly known as: COREG Take 1 tablet (6.25 mg total) by mouth 2 (two) times daily with a meal.   clopidogrel 75 MG tablet Commonly known as: PLAVIX Take 1 tablet (75 mg total) by mouth daily with breakfast. Start taking on: June 08, 2024   cyanocobalamin  1000 MCG tablet Take 1 tablet (1,000 mcg total) by mouth daily.   FeroSul 325 (65 Fe) MG tablet Generic drug: ferrous sulfate  TAKE 1 TABLET EVERY DAY WITH BREAKFAST What changed: See the new instructions.   gabapentin  300 MG capsule Commonly known as: NEURONTIN  Take 1 capsule (300 mg total) by mouth 3 (three) times daily.   glipiZIDE  10 MG 24 hr tablet Commonly known as: GLUCOTROL  XL Take 1 tablet (10 mg total) by mouth daily with breakfast.   isosorbide mononitrate 30 MG 24 hr tablet Commonly known as: IMDUR Take 1 tablet (30 mg total) by mouth daily. Start taking on: June 08, 2024   levothyroxine  200 MCG tablet Commonly known as: SYNTHROID  Take 1 tablet (200 mcg total) by mouth daily.   magnesium  oxide 400 (240 Mg) MG tablet Commonly known as: MAG-OX Take 1 tablet (400 mg total) by mouth daily.   metFORMIN  1000 MG tablet Commonly known as: GLUCOPHAGE  Take 1 tablet (1,000 mg total) by mouth 2 (two) times daily with a meal. Start taking on: June 09, 2024 What changed: These instructions start on June 09, 2024. If you are unsure what to do until then, ask your doctor or other care provider.   Multi-Vitamins Tabs Take 1 tablet by mouth daily.   nitroGLYCERIN 0.4 MG SL tablet Commonly known as: NITROSTAT Place 1 tablet (0.4 mg total) under the tongue every 5 (five) minutes x 3 doses as needed for chest pain.    pantoprazole  40 MG tablet Commonly known as: Protonix  Take 1 tablet (40 mg total) by mouth daily.   pioglitazone  30 MG tablet Commonly known as: Actos  Take 1 tablet (30 mg total) by mouth daily.   rosuvastatin  40 MG tablet Commonly known as: CRESTOR  Take 1 tablet (40 mg total) by mouth daily. Start taking on: June 08, 2024 What changed:  medication strength how much to take       Disposition: Home Diet recommendation: Cardiac diet  Discharge Exam: Vitals:   06/07/24 0324 06/07/24 0820 06/07/24 0953 06/07/24 1121  BP: (!) 110/50 138/71 (!) 140/50 131/64  Pulse: 70 68 65 66  Resp: 18 16 18 14   Temp: 98 F (36.7 C) 98.2 F (36.8 C)  98.4 F (36.9 C)  TempSrc: Oral Oral  Oral  SpO2: 96% 98%  97%  Weight:      Height:       General: Appear in no distress; no visible Abnormal Neck Mass Or lumps, Conjunctiva normal Cardiovascular: S1 and S2  Present, no Murmur, Respiratory: good respiratory effort, Bilateral Air entry present and CTA, no Crackles, no wheezes Abdomen: Bowel Sound present, Non tender  Extremities: no Pedal edema Neurology: alert and oriented to time, place, and person  Filed Weights   06/06/24 0915  Weight: 99.8 kg   Condition at discharge: stable  The results of significant diagnostics from this hospitalization (including imaging, microbiology, ancillary and laboratory) are listed below for reference.   Imaging Studies: CT Angio Chest Pulmonary Embolism (PE) W or WO Contrast Result Date: 06/06/2024 CLINICAL DATA:  High probability for pulmonary embolism. Shortness of breath. In the EXAM: CT ANGIOGRAPHY CHEST WITH CONTRAST TECHNIQUE: Multidetector CT imaging of the chest was performed using the standard protocol during bolus administration of intravenous contrast. Multiplanar CT image reconstructions and MIPs were obtained to evaluate the vascular anatomy. RADIATION DOSE REDUCTION: This exam was performed according to the departmental dose-optimization  program which includes automated exposure control, adjustment of the mA and/or kV according to patient size and/or use of iterative reconstruction technique. CONTRAST:  75mL OMNIPAQUE  IOHEXOL  350 MG/ML SOLN COMPARISON:  None Available. FINDINGS: Cardiovascular: Satisfactory opacification of the pulmonary arteries to the segmental level. No evidence of pulmonary embolism. The heart is enlarged. There is no pericardial effusion. There are atherosclerotic calcifications throughout the aorta and in the coronary arteries. Mediastinum/Nodes: No enlarged mediastinal, hilar, or axillary lymph nodes. Trachea and esophagus are within normal limits. The left thyroid  gland is enlarged and heterogeneous with calcifications. Left thyroid  seen. Lungs/Pleura: There some mild patchy ground-glass opacities throughout both lungs in the upper lung predominance. There is a band of atelectasis in the right middle lobe. There is no pleural effusion or pneumothorax. Upper Abdomen: Gallstones are present. Musculoskeletal: Cervical spinal fusion plate is present. Degenerative changes affect the spine. Review of the MIP images confirms the above findings. IMPRESSION: 1. No evidence for pulmonary embolism. 2. Cardiomegaly. 3. Mild patchy ground-glass opacities throughout both lungs in the upper lung predominance, nonspecific. Findings may be infectious/inflammatory. 4. Cholelithiasis. 5. Aortic atherosclerosis. Aortic Atherosclerosis (ICD10-I70.0). Electronically Signed   By: Greig Pique M.D.   On: 06/06/2024 23:55   CARDIAC CATHETERIZATION Result Date: 06/06/2024   1st Diag lesion is 40% stenosed.   Prox LAD lesion is 30% stenosed.   Prox RCA lesion is 40% stenosed.   Mid RCA lesion is 30% stenosed. 1.  Moderately calcified coronary arteries with mild to moderate nonobstructive coronary artery disease.  The coronary artery is very tortuous. 2.  Left ventricular angiography was not performed.  EF was normal by echo. 3.  Mildly elevated  left ventricular end-diastolic pressure at 17 mmHg. Recommendations: No clear culprit is identified for myocardial infarction.  The patient has myocardial infarction with non obstructive coronary artery disease (MINOCA).  The mechanism of this is not entirely clear.  I added clopidogrel to be used for 1 year and we will also add small dose Imdur.   ECHOCARDIOGRAM COMPLETE Result Date: 06/06/2024    ECHOCARDIOGRAM REPORT   Patient Name:   ATHANASIA STANWOOD Date of Exam: 06/06/2024 Medical Rec #:  [id number]     Height:       64.0 in Accession #:    [id number]    Weight:       220.0 lb Date of Birth:  1951-03-13    BSA:          2.037 m Patient Age:    72 years      BP:  155/67 mmHg Patient Gender: F             HR:           65 bpm. Exam Location:  Inpatient Procedure: 2D Echo, Color Doppler, Cardiac Doppler and Intracardiac            Opacification Agent (Both Spectral and Color Flow Doppler were            utilized during procedure). Indications:    NSTEMI i21.4  History:        Patient has prior history of Echocardiogram examinations, most                 recent 10/13/2022. Risk Factors:Hypertension, Diabetes and                 Dyslipidemia.  Sonographer:    Damien Senior RDCS Referring Phys: [id number] LOGAN N LOCKWOOD IMPRESSIONS  1. Left ventricular ejection fraction, by estimation, is 55 to 60%. The left ventricle has normal function. The left ventricle has no regional wall motion abnormalities. There is moderate concentric left ventricular hypertrophy. Left ventricular diastolic parameters are consistent with Grade I diastolic dysfunction (impaired relaxation).  2. Right ventricular systolic function is normal. The right ventricular size is normal. There is normal pulmonary artery systolic pressure. The estimated right ventricular systolic pressure is 30.5 mmHg.  3. Left atrial size was mildly dilated.  4. A small pericardial effusion is present. The pericardial effusion is anterior to the right ventricle.   5. The mitral valve is normal in structure. Trivial mitral valve regurgitation. No evidence of mitral stenosis. Moderate mitral annular calcification.  6. The aortic valve is calcified. Aortic valve regurgitation is not visualized.  7. The inferior vena cava is normal in size with <50% respiratory variability, suggesting right atrial pressure of 8 mmHg. FINDINGS  Left Ventricle: Left ventricular ejection fraction, by estimation, is 55 to 60%. The left ventricle has normal function. The left ventricle has no regional wall motion abnormalities. Definity  contrast agent was given IV to delineate the left ventricular  endocardial borders. The left ventricular internal cavity size was normal in size. There is moderate concentric left ventricular hypertrophy. Left ventricular diastolic parameters are consistent with Grade I diastolic dysfunction (impaired relaxation). Right Ventricle: The right ventricular size is normal. No increase in right ventricular wall thickness. Right ventricular systolic function is normal. There is normal pulmonary artery systolic pressure. The tricuspid regurgitant velocity is 2.37 m/s, and  with an assumed right atrial pressure of 8 mmHg, the estimated right ventricular systolic pressure is 30.5 mmHg. Left Atrium: Left atrial size was mildly dilated. Right Atrium: Right atrial size was normal in size. Pericardium: A small pericardial effusion is present. The pericardial effusion is anterior to the right ventricle. Mitral Valve: The mitral valve is normal in structure. Moderate mitral annular calcification. Trivial mitral valve regurgitation. No evidence of mitral valve stenosis. MV peak gradient, 7.7 mmHg. The mean mitral valve gradient is 4.0 mmHg. Tricuspid Valve: The tricuspid valve is normal in structure. Tricuspid valve regurgitation is trivial. No evidence of tricuspid stenosis. Aortic Valve: The aortic valve is calcified. Aortic valve regurgitation is not visualized. Pulmonic Valve: The  pulmonic valve was normal in structure. Pulmonic valve regurgitation is trivial. No evidence of pulmonic stenosis. Aorta: The aortic root and ascending aorta are structurally normal, with no evidence of dilitation. Venous: The inferior vena cava is normal in size with less than 50% respiratory variability, suggesting right atrial pressure of 8 mmHg. IAS/Shunts:  No atrial level shunt detected by color flow Doppler.  LEFT VENTRICLE PLAX 2D LVIDd:         4.60 cm   Diastology LVIDs:         3.20 cm   LV e' medial:    5.33 cm/s LV PW:         1.40 cm   LV E/e' medial:  18.0 LV IVS:        1.30 cm   LV e' lateral:   4.79 cm/s LVOT diam:     1.80 cm   LV E/e' lateral: 20.0 LV SV:         59 LV SV Index:   29 LVOT Area:     2.54 cm  RIGHT VENTRICLE RV S prime:     14.70 cm/s TAPSE (M-mode): 2.3 cm LEFT ATRIUM             Index        RIGHT ATRIUM           Index LA diam:        3.60 cm 1.77 cm/m   RA Area:     14.60 cm LA Vol (A2C):   59.4 ml 29.16 ml/m  RA Volume:   33.80 ml  16.59 ml/m LA Vol (A4C):   69.8 ml 34.26 ml/m LA Biplane Vol: 69.0 ml 33.87 ml/m  AORTIC VALVE LVOT Vmax:   113.00 cm/s LVOT Vmean:  80.800 cm/s LVOT VTI:    0.232 m  AORTA Ao Root diam: 3.00 cm Ao Asc diam:  3.20 cm MITRAL VALVE                TRICUSPID VALVE MV Area (PHT): 2.07 cm     TR Peak grad:   22.5 mmHg MV Area VTI:   1.54 cm     TR Vmax:        237.00 cm/s MV Peak grad:  7.7 mmHg MV Mean grad:  4.0 mmHg     SHUNTS MV Vmax:       1.39 m/s     Systemic VTI:  0.23 m MV Vmean:      95.7 cm/s    Systemic Diam: 1.80 cm MV Decel Time: 366 msec MV E velocity: 95.90 cm/s MV A velocity: 128.00 cm/s MV E/A ratio:  0.75 Aditya Sabharwal Electronically signed by Ria Commander Signature Date/Time: 06/06/2024/3:38:02 PM    Final    DG Chest 2 View Result Date: 06/06/2024 CLINICAL DATA:  Chest pain, pressure, hypertension EXAM: CHEST - 2 VIEW COMPARISON:  10/17/2022 FINDINGS: Lungs are clear.  No pneumothorax. Heart size and mediastinal  contours are within normal limits. Aortic Atherosclerosis (ICD10-170.0). No effusion. Cervical fixation hardware partially visualized. IMPRESSION: No acute cardiopulmonary disease. Electronically Signed   By: JONETTA Faes M.D.   On: 06/06/2024 10:53    Microbiology: Results for orders placed or performed during the hospital encounter of 10/12/22  Surgical PCR screen     Status: None   Collection Time: 10/14/22  3:28 PM   Specimen: Nasal Mucosa; Nasal Swab  Result Value Ref Range Status   MRSA, PCR NEGATIVE NEGATIVE Final   Staphylococcus aureus NEGATIVE NEGATIVE Final    Comment: (NOTE) The Xpert SA Assay (FDA approved for NASAL specimens in patients 85 years of age and older), is one component of a comprehensive surveillance program. It is not intended to diagnose infection nor to guide or monitor treatment. Performed at [facility], [address] [place],  [place] [address]   Culture, blood (Routine X 2) w Reflex to ID Panel     Status: None   Collection Time: 10/16/22  9:43 PM   Specimen: BLOOD LEFT HAND  Result Value Ref Range Status   Specimen Description BLOOD LEFT HAND  Final   Special Requests   Final    BOTTLES DRAWN AEROBIC AND ANAEROBIC Blood Culture adequate volume   Culture   Final    NO GROWTH 5 DAYS Performed at [facility], [address], [place], [place] [address]    Report Status 10/21/2022 FINAL  Final  Culture, blood (Routine X 2) w Reflex to ID Panel     Status: None   Collection Time: 10/16/22  9:43 PM   Specimen: BLOOD LEFT FOREARM  Result Value Ref Range Status   Specimen Description BLOOD LEFT FOREARM  Final   Special Requests   Final    IN PEDIATRIC BOTTLE Blood Culture results may not be optimal due to an inadequate volume of blood received in culture bottles   Culture   Final    NO GROWTH 5 DAYS Performed at [facility], [address] [place], [place] [address]    Report Status 10/21/2022 FINAL  Final   Labs: CBC: Recent  Labs  Lab 06/06/24 0930 06/07/24 0336  WBC 7.4 5.9  NEUTROABS 5.6  --   HGB 11.4* 10.1*  HCT 36.4 33.4*  MCV 84.8 84.8  PLT 205 186   Basic Metabolic Panel: Recent Labs  Lab 06/06/24 0930 06/06/24 1831 06/07/24 0920  NA 138  --  140  K 3.3*  --  3.8  CL 107  --  108  CO2 22  --  26  GLUCOSE 249*  --  119*  BUN 9  --  10  CREATININE 0.72  --  0.62  CALCIUM  8.5*  --  8.7*  MG  --  1.9 1.6*   Liver Function Tests: Recent Labs  Lab 06/06/24 2030  AST 19  ALT 9  ALKPHOS 58  BILITOT 0.3  PROT 4.7*  ALBUMIN 2.7*   CBG: Recent Labs  Lab 06/06/24 1726 06/06/24 2136 06/07/24 0559 06/07/24 1127  GLUCAP 113* 193* 107* 146*    Discharge time spent: greater than 30 minutes.  Author: Yetta Blanch, MD  [facility] 06/07/2024

## 2024-06-07 NOTE — Plan of Care (Signed)

## 2024-06-08 ENCOUNTER — Telehealth (HOSPITAL_COMMUNITY): Payer: Self-pay | Admitting: *Deleted

## 2024-06-08 ENCOUNTER — Telehealth: Payer: Self-pay

## 2024-06-08 LAB — LIPOPROTEIN A (LPA): Lipoprotein (a): 42.8 nmol/L — ABNORMAL HIGH (ref <75.0)

## 2024-06-08 NOTE — Telephone Encounter (Signed)
 Called pt to discuss Cardiac Rehab opportunity. She voices she is not interested in CRPII and that she does not have any questions regarding her heart. Aliene Aris BS, ACSM-CEP 06/08/2024 2:11 PM

## 2024-06-11 NOTE — Transitions of Care (Post Inpatient/ED Visit) (Signed)
   06/11/2024  Name: CORRENA MEACHAM MRN: [id number] DOB: [birth year]  Today's TOC FU Call Status:    Attempted to reach the patient regarding the most recent Inpatient/ED visit.  Follow Up Plan: No further outreach attempts will be made at this time. We have been unable to contact the patient.  Signature Julian Lemmings, LPN [facility] Nurse Health Advisor Direct Dial [phone]

## 2024-06-22 DIAGNOSIS — S52135A Nondisplaced fracture of neck of left radius, initial encounter for closed fracture: Secondary | ICD-10-CM | POA: Diagnosis not present

## 2024-06-22 DIAGNOSIS — M25512 Pain in left shoulder: Secondary | ICD-10-CM | POA: Diagnosis not present

## 2024-06-22 DIAGNOSIS — S4352XA Sprain of left acromioclavicular joint, initial encounter: Secondary | ICD-10-CM | POA: Diagnosis not present

## 2024-06-22 DIAGNOSIS — M25522 Pain in left elbow: Secondary | ICD-10-CM | POA: Diagnosis not present

## 2024-06-22 DIAGNOSIS — M25532 Pain in left wrist: Secondary | ICD-10-CM | POA: Diagnosis not present

## 2024-06-25 NOTE — Progress Notes (Unsigned)
 This encounter was created in error - please disregard.

## 2024-06-26 ENCOUNTER — Ambulatory Visit: Attending: Physician Assistant | Admitting: Physician Assistant

## 2024-06-26 DIAGNOSIS — I2510 Atherosclerotic heart disease of native coronary artery without angina pectoris: Secondary | ICD-10-CM

## 2024-07-03 ENCOUNTER — Telehealth: Payer: Self-pay | Admitting: Family Medicine

## 2024-07-03 NOTE — Telephone Encounter (Signed)
  CEPHALEXIN  500 MG CAP and GLIPIZIDE  ER 5MG  TAB are not on current medication list.

## 2024-07-03 NOTE — Telephone Encounter (Signed)
 Prescription Request  07/03/2024  LOV: 10/03/2023  What is the name of the medication or equipment?   CEPHALEXIN  500 MG CAP  GLIPIZIDE  ER 5MG  TAB  Have you contacted your pharmacy to request a refill? Yes   Which pharmacy would you like this sent to?  [facility] - [place], [place] - [address] [place] [address] Phone: [phone] Fax: [phone]    Patient notified that their request is being sent to the clinical staff for review and that they should receive a response within 2 business days.   Please advise pharmacist.

## 2024-07-18 ENCOUNTER — Telehealth: Payer: Self-pay | Admitting: Neurology

## 2024-07-18 NOTE — Telephone Encounter (Signed)
 I left message on machine at [facility] to call office back or escribe medication that this patient is looking for. Unsure of what is needed.

## 2024-07-18 NOTE — Telephone Encounter (Signed)
 Left a message with the after hour service on 07-18-24   Caller states a refill was called in and the pharmacy is needing to verify medication

## 2024-07-18 NOTE — Telephone Encounter (Signed)
 Pt cld bk and said [facility] will not call but just send fax, I xplnd we have not rcvd fax or a call but once we do we will refill the Rx. She said she is over it and no longer cares, then disconnected. Please contact Pt once Rx is rcvd and sent, thx.

## 2024-08-07 DIAGNOSIS — S72142D Displaced intertrochanteric fracture of left femur, subsequent encounter for closed fracture with routine healing: Secondary | ICD-10-CM | POA: Diagnosis not present

## 2024-08-07 DIAGNOSIS — M7062 Trochanteric bursitis, left hip: Secondary | ICD-10-CM | POA: Diagnosis not present

## 2024-08-22 ENCOUNTER — Telehealth: Payer: Self-pay

## 2024-08-22 NOTE — Telephone Encounter (Signed)
 Fax received from [facility] for updated information regarding the patient's bone density. Placed in green folder for you to sign. Thank you.

## 2024-09-04 ENCOUNTER — Telehealth: Payer: Self-pay

## 2024-09-04 DIAGNOSIS — E785 Hyperlipidemia, unspecified: Secondary | ICD-10-CM | POA: Diagnosis not present

## 2024-09-04 DIAGNOSIS — E039 Hypothyroidism, unspecified: Secondary | ICD-10-CM | POA: Diagnosis not present

## 2024-09-04 DIAGNOSIS — Z0000 Encounter for general adult medical examination without abnormal findings: Secondary | ICD-10-CM | POA: Diagnosis not present

## 2024-09-04 DIAGNOSIS — I10 Essential (primary) hypertension: Secondary | ICD-10-CM | POA: Diagnosis not present

## 2024-09-04 DIAGNOSIS — D509 Iron deficiency anemia, unspecified: Secondary | ICD-10-CM | POA: Diagnosis not present

## 2024-09-04 DIAGNOSIS — R200 Anesthesia of skin: Secondary | ICD-10-CM | POA: Diagnosis not present

## 2024-09-04 DIAGNOSIS — Z1159 Encounter for screening for other viral diseases: Secondary | ICD-10-CM | POA: Diagnosis not present

## 2024-09-04 DIAGNOSIS — E119 Type 2 diabetes mellitus without complications: Secondary | ICD-10-CM | POA: Diagnosis not present

## 2024-09-04 NOTE — Telephone Encounter (Signed)
 Copied from CRM #[id number]. Topic: Clinical - Request for Lab/Test Order >> Sep 04, 2024  9:26 AM Rea ORN wrote: Reason for CRM: April with [facility] called to request a Bone Denisty test. Pt's last test was 2017 and she has recently had a bone fracture. Please see fax that was sent 08/20/24 for more details from [facility].  Call back # [phone] or update fax and send back.

## 2024-09-05 ENCOUNTER — Other Ambulatory Visit: Payer: Self-pay

## 2024-09-05 DIAGNOSIS — Z780 Asymptomatic menopausal state: Secondary | ICD-10-CM

## 2024-09-05 DIAGNOSIS — S72002D Fracture of unspecified part of neck of left femur, subsequent encounter for closed fracture with routine healing: Secondary | ICD-10-CM

## 2024-09-10 ENCOUNTER — Telehealth: Payer: Self-pay

## 2024-09-10 NOTE — Telephone Encounter (Signed)
 Copied from CRM [id number]. Topic: General - Other >> Sep 10, 2024  2:50 PM Wess RAMAN wrote: Reason for CRM: Patient would like to speak with Angelena Ronal Slater MARLA, LPN about flu shots  Callback #: [phone]

## 2024-09-11 ENCOUNTER — Ambulatory Visit

## 2024-09-11 DIAGNOSIS — Z23 Encounter for immunization: Secondary | ICD-10-CM | POA: Diagnosis not present

## 2024-09-11 NOTE — Progress Notes (Signed)
 Patient is in office today for a nurse visit for Immunization. Patient Injection was given in the  Right deltoid. Patient tolerated injection well.

## 2024-09-20 DIAGNOSIS — E119 Type 2 diabetes mellitus without complications: Secondary | ICD-10-CM | POA: Diagnosis not present

## 2024-09-20 DIAGNOSIS — H40013 Open angle with borderline findings, low risk, bilateral: Secondary | ICD-10-CM | POA: Diagnosis not present

## 2024-09-20 DIAGNOSIS — H25013 Cortical age-related cataract, bilateral: Secondary | ICD-10-CM | POA: Diagnosis not present

## 2024-09-20 DIAGNOSIS — H2513 Age-related nuclear cataract, bilateral: Secondary | ICD-10-CM | POA: Diagnosis not present

## 2024-09-20 DIAGNOSIS — H524 Presbyopia: Secondary | ICD-10-CM | POA: Diagnosis not present

## 2024-12-11 ENCOUNTER — Encounter: Admitting: Family Medicine

## 2024-12-19 ENCOUNTER — Telehealth: Payer: Self-pay

## 2024-12-19 NOTE — Telephone Encounter (Signed)
 Copied from CRM [id number]. Topic: General - Other >> Dec 19, 2024  8:03 AM Willma SAUNDERS wrote: Reason for CRM: Patient called asking to speak with Ronal Bradley, states its in regards to her husband.  Patient can be reached at [phone]

## 2024-12-24 ENCOUNTER — Other Ambulatory Visit: Payer: Self-pay

## 2024-12-24 ENCOUNTER — Telehealth: Payer: Self-pay

## 2024-12-24 DIAGNOSIS — J302 Other seasonal allergic rhinitis: Secondary | ICD-10-CM

## 2024-12-24 DIAGNOSIS — E1169 Type 2 diabetes mellitus with other specified complication: Secondary | ICD-10-CM

## 2024-12-24 MED ORDER — ROSUVASTATIN CALCIUM 40 MG PO TABS: 40.0000 mg | ORAL_TABLET | Freq: Every day | ORAL | 1 refills | Status: AC

## 2024-12-24 MED ORDER — LEVOCETIRIZINE DIHYDROCHLORIDE 5 MG PO TABS: 5.0000 mg | ORAL_TABLET | Freq: Every evening | ORAL | 1 refills | Status: AC

## 2024-12-24 NOTE — Telephone Encounter (Signed)
 Copied from CRM #[id number]. Topic: General - Other >> Dec 24, 2024 12:42 PM Larissa RAMAN wrote: Reason for CRM: Att: Ronal Bradley- Patient requesting a callback, regarding spouse passing away.
